# Patient Record
Sex: Female | Born: 1945
Health system: Southern US, Community
[De-identification: ages and names within clinical notes are randomized; demographics above are authoritative.]

## PROBLEM LIST (undated history)

## (undated) DIAGNOSIS — E785 Hyperlipidemia, unspecified: Secondary | ICD-10-CM

## (undated) DIAGNOSIS — H269 Unspecified cataract: Secondary | ICD-10-CM

## (undated) DIAGNOSIS — K5790 Diverticulosis of intestine, part unspecified, without perforation or abscess without bleeding: Secondary | ICD-10-CM

## (undated) DIAGNOSIS — C4492 Squamous cell carcinoma of skin, unspecified: Secondary | ICD-10-CM

## (undated) DIAGNOSIS — L989 Disorder of the skin and subcutaneous tissue, unspecified: Secondary | ICD-10-CM

## (undated) DIAGNOSIS — I6529 Occlusion and stenosis of unspecified carotid artery: Secondary | ICD-10-CM

## (undated) DIAGNOSIS — S6290XA Unspecified fracture of unspecified wrist and hand, initial encounter for closed fracture: Secondary | ICD-10-CM

## (undated) DIAGNOSIS — M8580 Other specified disorders of bone density and structure, unspecified site: Secondary | ICD-10-CM

## (undated) HISTORY — DX: Squamous cell carcinoma of skin, unspecified: C44.92

## (undated) HISTORY — DX: Diverticulosis of intestine, part unspecified, without perforation or abscess without bleeding: K57.90

## (undated) HISTORY — DX: Unspecified fracture of unspecified hand, initial encounter for closed fracture: S62.90XA

## (undated) HISTORY — DX: Disorder of the skin and subcutaneous tissue, unspecified: L98.9

## (undated) HISTORY — DX: Unspecified cataract: H26.9

## (undated) HISTORY — DX: Hyperlipidemia, unspecified: E78.5

## (undated) HISTORY — PX: OTHER SURGICAL HISTORY: SHX169

## (undated) HISTORY — PX: CATARACT EXTRACTION W/ INTRAOCULAR LENS IMPLANT: SHX1309

## (undated) HISTORY — PX: EYE SURGERY: SHX253

## (undated) HISTORY — DX: Other specified disorders of bone density and structure, unspecified site: M85.80

## (undated) HISTORY — DX: Occlusion and stenosis of unspecified carotid artery: I65.29

---

## 2000-10-08 ENCOUNTER — Ambulatory Visit (HOSPITAL_COMMUNITY): Admission: RE | Admit: 2000-10-08 | Discharge: 2000-10-08 | Payer: Self-pay | Admitting: Obstetrics and Gynecology

## 2000-10-08 ENCOUNTER — Encounter: Payer: Self-pay | Admitting: Obstetrics and Gynecology

## 2000-10-12 ENCOUNTER — Ambulatory Visit (HOSPITAL_COMMUNITY): Admission: RE | Admit: 2000-10-12 | Discharge: 2000-10-12 | Payer: Self-pay | Admitting: Obstetrics and Gynecology

## 2000-10-12 ENCOUNTER — Encounter: Payer: Self-pay | Admitting: Obstetrics and Gynecology

## 2000-10-15 ENCOUNTER — Encounter: Payer: Self-pay | Admitting: Obstetrics and Gynecology

## 2000-10-15 ENCOUNTER — Ambulatory Visit (HOSPITAL_COMMUNITY): Admission: RE | Admit: 2000-10-15 | Discharge: 2000-10-15 | Payer: Self-pay | Admitting: Obstetrics and Gynecology

## 2000-11-09 ENCOUNTER — Ambulatory Visit (HOSPITAL_COMMUNITY): Admission: RE | Admit: 2000-11-09 | Discharge: 2000-11-09 | Payer: Self-pay | Admitting: Gastroenterology

## 2001-10-14 ENCOUNTER — Ambulatory Visit (HOSPITAL_COMMUNITY): Admission: RE | Admit: 2001-10-14 | Discharge: 2001-10-14 | Payer: Self-pay | Admitting: Obstetrics and Gynecology

## 2001-10-14 ENCOUNTER — Encounter: Payer: Self-pay | Admitting: Obstetrics and Gynecology

## 2002-10-21 ENCOUNTER — Ambulatory Visit (HOSPITAL_COMMUNITY): Admission: RE | Admit: 2002-10-21 | Discharge: 2002-10-21 | Payer: Self-pay | Admitting: Obstetrics and Gynecology

## 2002-10-21 ENCOUNTER — Encounter: Payer: Self-pay | Admitting: Obstetrics and Gynecology

## 2003-10-28 ENCOUNTER — Ambulatory Visit (HOSPITAL_COMMUNITY): Admission: RE | Admit: 2003-10-28 | Discharge: 2003-10-28 | Payer: Self-pay | Admitting: Obstetrics and Gynecology

## 2004-11-02 ENCOUNTER — Ambulatory Visit (HOSPITAL_COMMUNITY): Admission: RE | Admit: 2004-11-02 | Discharge: 2004-11-02 | Payer: Self-pay | Admitting: Obstetrics and Gynecology

## 2004-12-29 ENCOUNTER — Ambulatory Visit: Payer: Self-pay | Admitting: Internal Medicine

## 2005-11-13 ENCOUNTER — Ambulatory Visit (HOSPITAL_COMMUNITY): Admission: RE | Admit: 2005-11-13 | Discharge: 2005-11-13 | Payer: Self-pay | Admitting: Obstetrics and Gynecology

## 2006-11-16 ENCOUNTER — Ambulatory Visit (HOSPITAL_COMMUNITY): Admission: RE | Admit: 2006-11-16 | Discharge: 2006-11-16 | Payer: Self-pay | Admitting: Obstetrics and Gynecology

## 2007-02-28 HISTORY — PX: CHOLECYSTECTOMY: SHX55

## 2007-10-23 ENCOUNTER — Ambulatory Visit (HOSPITAL_COMMUNITY): Admission: RE | Admit: 2007-10-23 | Discharge: 2007-10-23 | Payer: Self-pay | Admitting: Orthopedic Surgery

## 2007-10-25 ENCOUNTER — Inpatient Hospital Stay (HOSPITAL_COMMUNITY): Admission: EM | Admit: 2007-10-25 | Discharge: 2007-10-26 | Payer: Self-pay | Admitting: Emergency Medicine

## 2007-10-25 ENCOUNTER — Encounter (INDEPENDENT_AMBULATORY_CARE_PROVIDER_SITE_OTHER): Payer: Self-pay | Admitting: Surgery

## 2007-11-26 ENCOUNTER — Ambulatory Visit (HOSPITAL_COMMUNITY): Admission: RE | Admit: 2007-11-26 | Discharge: 2007-11-26 | Payer: Self-pay | Admitting: Obstetrics and Gynecology

## 2008-11-27 ENCOUNTER — Ambulatory Visit (HOSPITAL_COMMUNITY): Admission: RE | Admit: 2008-11-27 | Discharge: 2008-11-27 | Payer: Self-pay | Admitting: Obstetrics and Gynecology

## 2009-09-19 ENCOUNTER — Emergency Department (HOSPITAL_COMMUNITY): Admission: EM | Admit: 2009-09-19 | Discharge: 2009-09-19 | Payer: Self-pay | Admitting: Emergency Medicine

## 2009-11-29 ENCOUNTER — Ambulatory Visit (HOSPITAL_COMMUNITY): Admission: RE | Admit: 2009-11-29 | Discharge: 2009-11-29 | Payer: Self-pay | Admitting: Obstetrics and Gynecology

## 2010-03-21 ENCOUNTER — Encounter: Payer: Self-pay | Admitting: Orthopedic Surgery

## 2010-06-07 ENCOUNTER — Other Ambulatory Visit: Payer: Self-pay | Admitting: Family Medicine

## 2010-06-07 DIAGNOSIS — S199XXA Unspecified injury of neck, initial encounter: Secondary | ICD-10-CM

## 2010-06-07 DIAGNOSIS — S0990XA Unspecified injury of head, initial encounter: Secondary | ICD-10-CM

## 2010-06-10 ENCOUNTER — Ambulatory Visit
Admission: RE | Admit: 2010-06-10 | Discharge: 2010-06-10 | Disposition: A | Discharge status: Home / Self Care | Payer: Medicare Other | Source: Ambulatory Visit | Attending: Family Medicine | Admitting: Family Medicine

## 2010-06-10 DIAGNOSIS — S199XXA Unspecified injury of neck, initial encounter: Secondary | ICD-10-CM

## 2010-06-10 DIAGNOSIS — S0990XA Unspecified injury of head, initial encounter: Secondary | ICD-10-CM

## 2010-07-12 NOTE — H&P (Signed)
NAMETYJAH, HAI NO.:  [id number]   MEDICAL RECORD NO.:  [id number]          PATIENT TYPE:  INP   LOCATION:  0098                         FACILITY:  [facility]   PHYSICIAN:  Sandria Bales. Ezzard Standing, M.D.  DATE OF BIRTH:  1946-02-04   DATE OF ADMISSION:  10/25/2007  DATE OF DISCHARGE:                              HISTORY & PHYSICAL   Admission H&P  Date of admission ??   REFERRING PHYSICIAN:  Ernestina Penna, M.D.   REASON FOR REFERRAL:  Gallbladder disease.   HISTORY OF ILLNESS:  Ms. Mee is a 65 year old white female a patient  of Dr. Vernon Prey, who has had a vague, right upper quadrant pain for  about 10 days.  This pain has gotten steadily worse.  She has [place]  food one night and had severe nausea and vomiting associated with it.  She went by an urgent care on [address] about 5-6 days ago.  They  thought she had a pulled muscle.  She then was seen and had an  ultrasound ordered on Wednesday, October 23, 2007.  This shows a  gallstone in the gallbladder, but no thickening of the gallbladder wall.  Her symptoms have persisted.  She saw Dr. Vernon Prey today.  Because of  persistent symptoms, he called me.   ALLERGIES:  1. PENICILLIN which makes her itch, but this was over 20 years ago.  2. SULFA which gave some whelps, this was about 10 years ago.   CURRENT MEDICATIONS:  1. Tricor 145 mg daily.  2. Vytorin 10/20 145 daily.  3. Aspirin 81 mg daily.  4. Calcium.   REVIEW OF SYSTEMS:  NEUROLOGIC:  No seizure or loss of consciousness.  PULMONARY:  She does not smoke cigarettes.  No history of pneumonia or  tuberculosis.  CARDIAC:  She has been told she has a heart murmur.  She has had no  cardiac evaluation, cardiac catheterization, has not seen a  cardiologist.  GASTROINTESTINAL:  She has had a history of diverticular disease.  Had a  colonoscopy by Dr. Bernette Redbird in 2008 which was negative.  She has  had no history of peptic ulcer disease.  No liver  disease, pancreatic  disease, colon disease.  Only prior abdominal surgery was a tubal  ligation in the past.  GYNECOLOGIC:  She has no vaginal discharge.  She sees Dr. Kyra Manges  from a GYN standpoint.  UROLOGIC:  She has no kidney stones or kidney infections.   PERSONAL HISTORY:  Her husband died earlier this year apparently of a  massive heart attack.  She has 2 children, a son and a daughter who both  live up in [place].  Today  is her granddaughter's birthday today.  She is retired from [employer] in [place].   PHYSICAL EXAMINATION:  VITAL SIGNS:  Temperature 98.2, blood pressure  172/80, pulse 62, respirations 18.  GENERAL:  She is a well-nourished, pleasant, tan white female alert and  cooperative on physical examination.  HEENT:  Unremarkable.  NECK:  Supple.  I found no mass, no thyromegaly.  LUNGS:  Clear to auscultation with symmetric breath sounds.  HEART:  Regular rate and rhythm.  She does have what feels like maybe a  low 2/6 systolic murmur heard best at the left sternal border.  BREASTS:  I did not do a breast exam on her.  She is up for mammogram in  September.  ABDOMEN:  She has some vague right upper quadrant pain, but without any  guarding or rebound.  She has some present bowel sounds.  Again, she is  tan.  Her only scars are the umbilicus.  EXTREMITIES:  She has good strength in all 4 extremities.  NEUROLOGIC:  Grossly intact.   LABORATORY DATA:  Labs that I have--the first are dated September 15, 2007  which shows normal liver functions at that time.  Then more recently,  she had a CBC today at [facility] that shows a white  blood count of 9,600, hemoglobin 13.2 with a normal differential.   DIAGNOSTICS:  Again, I rereviewed her ultrasound with Tor Netters.  The  [place] was done on October 23, 2007 that showed a tiny 2 mm gallstone and  otherwise unremarkable gallbladder.   IMPRESSION:  1. Probable gallbladder disease.  I  discussed with her at length the options for treatment.  I told she  did not have classic findings of acute cholecystitis by ultrasound, but  she does have a gallstone.  She does have symptoms which suggest  gallbladder disease.  She has symptoms which are related to eating.  All  of which I think lean toward biliary disease as  she has no other  underlying GI problems that we are aware of.  I offered the option of  going ahead with gallbladder surgery versus waiting.  I also told that  she knows she is on aspirin, there is increased risk of bleeding, but  she was anxious to go ahead and have her surgery done at this time.  I told her if the surgery did not take care of her symptoms, she may  need further evaluation by Dr. Matthias Hughs with something like an endoscopy  or other tests.  The risks of gallbladder surgery I told her with doing  the laparoscopic, the risk of bleeding, infection, nerve injury, common  bile duct injury and the possibility of open surgery.  I think she  understands this all very well.  1. Hypercholesterolemia.  2. Minimal heart murmur.  3. History of diverticular disease, followed by Dr. Matthias Hughs.  4. On aspirin.  (Patient knows this is a risk factor for increased      bleeding.)      Sandria Bales. Ezzard Standing, M.D.  Electronically Signed     DHN/MEDQ  D:  10/25/2007  T:  10/25/2007  Job:  [id number]   cc:   Ernestina Penna, M.D.  Fax: [phone]   Bernette Redbird, M.D.  Fax: [phone]   S. Kyra Manges, M.D.  Fax: [phone]

## 2010-07-12 NOTE — Op Note (Signed)
Carol [name], EDMONDS NO.:  [id number]   MEDICAL RECORD NO.:  [id number]          PATIENT TYPE:  INP   LOCATION:  1519                         FACILITY:  [facility]   PHYSICIAN:  Sandria Bales. Ezzard Standing, M.D.  DATE OF BIRTH:  Nov 10, 1945   DATE OF PROCEDURE:  10/25/2007  DATE OF DISCHARGE:  10/26/2007                               OPERATIVE REPORT   Date of surgery ??   PREOPERATIVE DIAGNOSES:  1. Cholecystitis.  2. Cholelithiasis.   POSTOPERATIVE DIAGNOSES:  1. Chronic cholecystitis.  2. Cholelithiasis.   PROCEDURE:  Laparoscopic cholecystectomy with intraoperative  cholangiogram.   SURGEON:  Dr. Ezzard Standing.   FIRST ASSISTANT:  None.   ANESTHESIA:  General endotracheal.   ESTIMATED BLOOD LOSS:  Minimal.   INDICATION OF PROCEDURE:  Ms. Lacap is a 65 year old white female who  is a patient of Dr. Vernon Prey who has had about 10-day history of  epigastric abdominal pain, which is located to the right upper quadrant.  She had an ultrasound obtained, which showed a single gallstone and she  has had persistent symptoms.  She presented to the [facility] Emergency  Room after being referred by Dr. Vernon Prey.  Discussion with her about  proceeding with gallbladder surgery.   The indications and potential complications of the gallbladder surgery  explained to the patient.  Potential complications include, but are not  limited to, bleeding, infection, bile duct, and possible of open  surgery.   Also there is a chance that her symptoms may not be relieved by the  gallbladder surgery.   OPERATIVE NOTE:  The patient placed in a supine position, given a  general endotracheal anesthetic.  Her abdomen was prepped with [facility]-  Care and then sterilely draped.  A time-out was held to identifying the  patient and procedure.   I accessed the abdominal cavity through infraumbilical incision with  sharp dissection carried down to abdominal cavity.  A 10-mm laparoscope  was inserted  through a 12-mm Hasson trocar.  The Hasson trocar was  secured with the 0 Vicryl suture.  Three additional trocars were placed,  a 10-mm subxiphoid, 5 mm right mid subcostal, and 5 mm lateral subcostal  trocar.   I did an exploration of right and left lower lobes of liver was  unremarkable.  The stomach and duodenum were unremarkable.  The bile  duct, I could see was unremarkable.  There is no other mass, lesions, or  anything else suspicious in her abdomen.   I then focused on the gallbladder, I grasped the gallbladder rostrally  and cephalad.  She did have some fatty adhesions along the wall of the  gallbladder consistent with chronic cholecystitis.  I then took down to  the gallbladder to the cystic duct junction and identified the cystic  artery, which I triply endoclipped and divided.  I then placed a clip on  the gallbladder at the side of cystic duct.  I shot an intraoperative  cholangiogram.   The intraoperative cholangiogram showed free flow of contrast down the  cystic duct and the common bile duct into the duodenum  up to the hepatic  radicals.  There was no filling defect and this was thought to be a  normal intraoperative cholangiogram.   I then removed the Taut catheter.  I triply endoclipped the cystic duct  and divided it.  I then visualized the triangle of Calot.  I visualized  the gallbladder bed and then used the primary hook, Bovie coagulation to  dissect the gallbladder from the gallbladder bed.   Prior to complete division of the gallbladder from the gallbladder bed,  I revisualized the triangle of Calot, I revisualized the gallbladder  bed.  There was no bleeding or bile leak.  I then divided the  gallbladder, placed in EndoCatch bag, and delivered through the  umbilicus.   I then irrigated the abdomen about a liter of saline.  I saw no other  problems or injuries.  Each trocar was then removed in turn.  The  umbilical port was closed with two 0 Vicryl  sutures.  The skin at each  site was closed with a 5-0 Vicryl suture.  Painted with tincture of  Benzoin and Steri-stripped and sterilely dressed.  All sponge and needle  counts were correct at the end of the case.   The patient was sent to the recovery room in good condition.        Sandria Bales. Ezzard Standing, M.D.  Electronically Signed     DHN/MEDQ  D:  10/25/2007  T:  10/26/2007  Job:  [id number]   cc:   Ernestina Penna, M.D.  Bernette Redbird, M.D.

## 2010-07-15 NOTE — Procedures (Signed)
[place]  Patient:    Carol [name], Carol [name] Visit Number: [id number] MRN: [id number]          Service Type: Attending:  Florencia Reasons, M.D. Dictated by:   Florencia Reasons, M.D. Proc. Date: 11/09/00   CC:         Katherine Roan, M.D.  Monica Becton, M.D.   Procedure Report  PROCEDURE PERFORMED:  Colonoscopy.  INDICATION:  Transiently hemoccult positive stool and abnormal appearing sigmoid region on the barium enema.  Rule out colorectal neoplasia.  FINDINGS:  Significant sigmoid diverticular disease with associated muscular thickening, but no evidence of intraluminal neoplasia.  DESCRIPTION OF PROCEDURE:  The nature, purpose and risks of the procedure had been reviewed with the patient who provided written consent.  Sedation was Fentanyl 75 mcg and Versed 7.5 mg IV without arrhythmias or desaturation.  The Olympus pediatric standard colonoscope was advanced without much difficulty around the colon and into a normal appearing terminal ileum for a short distance, after which pull back was initiated.  The quality of the prep was excellent and it is felt that all areas were well seen.  The only abnormality on this exam was moderate sigmoid diverticular disease with associated muscular thickening in that region, but no evidence of mucosal abnormalities.  Specifically, there was no evidence of polyps, cancer, colitis or vascular malformation.  Retroflexion in the rectum was normal.  No biopsies were obtained.  The patient tolerated procedure well.  There were no apparent complications.  IMPRESSION:  Sigmoid diverticulosis with muscular thickening presumably accounting for the mild narrowing on the barium enema.  I reviewed the barium enema films and find them to be compatible with the observed colonoscopic findings.  Specifically, the barium enema did not show any high grade stricture or "apple cord" lesion, nor any other particularly  worrisome findings.  Note, that the sigmoid region was examined twice during this exam, that is I went back up and readvanced the scope through the sigmoid region to the descending colon area to recheck the area and again uncovered no significant abnormalities.  RECOMMENDATIONS:  The patient is currently asymptomatic.  Her abdominal pain and transient diarrhea have resolved and the hemoccult stool which was detected by Dr. Elana Alm had become hemoccult negative by the time I checked the patient.  This was probably a transient case of diverticulitis and I do not feel that any further evaluation is needed.  Follow up screening examination with sigmoidoscopy in five years would be reasonable and I will put the patient on our follow up list for that purpose. Dictated by:   Florencia Reasons, M.D. Attending:  Florencia Reasons, M.D. DD:  11/09/00 TD:  11/09/00 Job: [id number]

## 2010-09-12 ENCOUNTER — Emergency Department (HOSPITAL_COMMUNITY)
Admission: EM | Admit: 2010-09-12 | Discharge: 2010-09-13 | Disposition: A | Discharge status: Home / Self Care | Payer: Medicare Other | Attending: Emergency Medicine | Admitting: Emergency Medicine

## 2010-09-12 DIAGNOSIS — Z7982 Long term (current) use of aspirin: Secondary | ICD-10-CM | POA: Insufficient documentation

## 2010-09-12 DIAGNOSIS — Z79899 Other long term (current) drug therapy: Secondary | ICD-10-CM | POA: Insufficient documentation

## 2010-09-12 DIAGNOSIS — E7800 Pure hypercholesterolemia, unspecified: Secondary | ICD-10-CM | POA: Insufficient documentation

## 2010-09-12 DIAGNOSIS — R109 Unspecified abdominal pain: Secondary | ICD-10-CM | POA: Insufficient documentation

## 2010-09-12 DIAGNOSIS — K5730 Diverticulosis of large intestine without perforation or abscess without bleeding: Secondary | ICD-10-CM | POA: Insufficient documentation

## 2010-09-13 LAB — CBC
Hemoglobin: 12.9 g/dL (ref 12.0–15.0)
MCH: 30.1 pg (ref 26.0–34.0)
WBC: 13.7 10*3/uL — ABNORMAL HIGH (ref 4.0–10.5)

## 2010-09-13 LAB — DIFFERENTIAL
Basophils Absolute: 0 10*3/uL (ref 0.0–0.1)
Basophils Relative: 0 % (ref 0–1)
Eosinophils Relative: 1 % (ref 0–5)
Lymphocytes Relative: 16 % (ref 12–46)
Monocytes Relative: 7 % (ref 3–12)
Neutro Abs: 10.5 10*3/uL — ABNORMAL HIGH (ref 1.7–7.7)
Neutrophils Relative %: 77 % (ref 43–77)

## 2010-09-13 LAB — URINALYSIS, ROUTINE W REFLEX MICROSCOPIC
Bilirubin Urine: NEGATIVE
Ketones, ur: NEGATIVE mg/dL
Protein, ur: NEGATIVE mg/dL

## 2010-09-13 LAB — BASIC METABOLIC PANEL
BUN: 13 mg/dL (ref 6–23)
CO2: 29 mEq/L (ref 19–32)
Chloride: 106 mEq/L (ref 96–112)

## 2010-09-13 LAB — URINE MICROSCOPIC-ADD ON

## 2010-11-23 ENCOUNTER — Other Ambulatory Visit: Payer: Self-pay | Admitting: Family Medicine

## 2010-11-23 DIAGNOSIS — Z139 Encounter for screening, unspecified: Secondary | ICD-10-CM

## 2010-12-05 ENCOUNTER — Ambulatory Visit (HOSPITAL_COMMUNITY)
Admission: RE | Admit: 2010-12-05 | Discharge: 2010-12-05 | Disposition: A | Discharge status: Home / Self Care | Payer: Medicare Other | Source: Ambulatory Visit | Attending: Family Medicine | Admitting: Family Medicine

## 2010-12-05 DIAGNOSIS — Z1231 Encounter for screening mammogram for malignant neoplasm of breast: Secondary | ICD-10-CM | POA: Insufficient documentation

## 2010-12-05 DIAGNOSIS — Z139 Encounter for screening, unspecified: Secondary | ICD-10-CM

## 2011-03-02 DIAGNOSIS — H52229 Regular astigmatism, unspecified eye: Secondary | ICD-10-CM | POA: Diagnosis not present

## 2011-03-02 DIAGNOSIS — H524 Presbyopia: Secondary | ICD-10-CM | POA: Diagnosis not present

## 2011-03-02 DIAGNOSIS — H40019 Open angle with borderline findings, low risk, unspecified eye: Secondary | ICD-10-CM | POA: Diagnosis not present

## 2011-03-02 DIAGNOSIS — H5200 Hypermetropia, unspecified eye: Secondary | ICD-10-CM | POA: Diagnosis not present

## 2011-03-14 DIAGNOSIS — J019 Acute sinusitis, unspecified: Secondary | ICD-10-CM | POA: Diagnosis not present

## 2011-03-15 DIAGNOSIS — H40019 Open angle with borderline findings, low risk, unspecified eye: Secondary | ICD-10-CM | POA: Diagnosis not present

## 2011-04-28 DIAGNOSIS — R195 Other fecal abnormalities: Secondary | ICD-10-CM | POA: Diagnosis not present

## 2011-04-28 DIAGNOSIS — K625 Hemorrhage of anus and rectum: Secondary | ICD-10-CM | POA: Diagnosis not present

## 2011-05-23 DIAGNOSIS — K5730 Diverticulosis of large intestine without perforation or abscess without bleeding: Secondary | ICD-10-CM | POA: Diagnosis not present

## 2011-05-23 DIAGNOSIS — R195 Other fecal abnormalities: Secondary | ICD-10-CM | POA: Diagnosis not present

## 2011-06-06 DIAGNOSIS — E785 Hyperlipidemia, unspecified: Secondary | ICD-10-CM | POA: Diagnosis not present

## 2011-07-26 DIAGNOSIS — L0291 Cutaneous abscess, unspecified: Secondary | ICD-10-CM | POA: Diagnosis not present

## 2011-07-26 DIAGNOSIS — L0390 Cellulitis, unspecified: Secondary | ICD-10-CM | POA: Diagnosis not present

## 2011-08-04 DIAGNOSIS — L0390 Cellulitis, unspecified: Secondary | ICD-10-CM | POA: Diagnosis not present

## 2011-08-04 DIAGNOSIS — L0291 Cutaneous abscess, unspecified: Secondary | ICD-10-CM | POA: Diagnosis not present

## 2011-08-05 DIAGNOSIS — L509 Urticaria, unspecified: Secondary | ICD-10-CM | POA: Diagnosis not present

## 2011-08-21 DIAGNOSIS — L0390 Cellulitis, unspecified: Secondary | ICD-10-CM | POA: Diagnosis not present

## 2011-08-21 DIAGNOSIS — L0291 Cutaneous abscess, unspecified: Secondary | ICD-10-CM | POA: Diagnosis not present

## 2011-08-28 DIAGNOSIS — L0291 Cutaneous abscess, unspecified: Secondary | ICD-10-CM | POA: Diagnosis not present

## 2011-08-28 DIAGNOSIS — L0390 Cellulitis, unspecified: Secondary | ICD-10-CM | POA: Diagnosis not present

## 2011-09-07 DIAGNOSIS — H40059 Ocular hypertension, unspecified eye: Secondary | ICD-10-CM | POA: Diagnosis not present

## 2011-11-07 DIAGNOSIS — H02409 Unspecified ptosis of unspecified eyelid: Secondary | ICD-10-CM | POA: Diagnosis not present

## 2011-11-07 DIAGNOSIS — H0230 Blepharochalasis unspecified eye, unspecified eyelid: Secondary | ICD-10-CM | POA: Diagnosis not present

## 2011-11-27 ENCOUNTER — Other Ambulatory Visit: Payer: Self-pay | Admitting: Family Medicine

## 2011-11-27 DIAGNOSIS — Z139 Encounter for screening, unspecified: Secondary | ICD-10-CM

## 2011-11-28 DIAGNOSIS — E559 Vitamin D deficiency, unspecified: Secondary | ICD-10-CM | POA: Diagnosis not present

## 2011-11-28 DIAGNOSIS — I10 Essential (primary) hypertension: Secondary | ICD-10-CM | POA: Diagnosis not present

## 2011-11-28 DIAGNOSIS — E785 Hyperlipidemia, unspecified: Secondary | ICD-10-CM | POA: Diagnosis not present

## 2011-12-04 DIAGNOSIS — E785 Hyperlipidemia, unspecified: Secondary | ICD-10-CM | POA: Diagnosis not present

## 2011-12-04 DIAGNOSIS — Z23 Encounter for immunization: Secondary | ICD-10-CM | POA: Diagnosis not present

## 2011-12-07 ENCOUNTER — Ambulatory Visit (HOSPITAL_COMMUNITY)
Admission: RE | Admit: 2011-12-07 | Discharge: 2011-12-07 | Disposition: A | Discharge status: Home / Self Care | Payer: Medicare Other | Source: Ambulatory Visit | Attending: Family Medicine | Admitting: Family Medicine

## 2011-12-07 DIAGNOSIS — Z1231 Encounter for screening mammogram for malignant neoplasm of breast: Secondary | ICD-10-CM | POA: Insufficient documentation

## 2011-12-07 DIAGNOSIS — Z139 Encounter for screening, unspecified: Secondary | ICD-10-CM

## 2012-01-02 DIAGNOSIS — Z8619 Personal history of other infectious and parasitic diseases: Secondary | ICD-10-CM | POA: Insufficient documentation

## 2012-01-02 DIAGNOSIS — K5730 Diverticulosis of large intestine without perforation or abscess without bleeding: Secondary | ICD-10-CM | POA: Insufficient documentation

## 2012-01-08 DIAGNOSIS — H02409 Unspecified ptosis of unspecified eyelid: Secondary | ICD-10-CM | POA: Diagnosis not present

## 2012-01-15 DIAGNOSIS — Z4889 Encounter for other specified surgical aftercare: Secondary | ICD-10-CM | POA: Diagnosis not present

## 2012-01-15 DIAGNOSIS — Z9889 Other specified postprocedural states: Secondary | ICD-10-CM | POA: Diagnosis not present

## 2012-02-05 DIAGNOSIS — H1030 Unspecified acute conjunctivitis, unspecified eye: Secondary | ICD-10-CM | POA: Diagnosis not present

## 2012-02-05 DIAGNOSIS — H18239 Secondary corneal edema, unspecified eye: Secondary | ICD-10-CM | POA: Diagnosis not present

## 2012-02-14 DIAGNOSIS — H18239 Secondary corneal edema, unspecified eye: Secondary | ICD-10-CM | POA: Diagnosis not present

## 2012-03-01 DIAGNOSIS — H40009 Preglaucoma, unspecified, unspecified eye: Secondary | ICD-10-CM | POA: Diagnosis not present

## 2012-03-06 DIAGNOSIS — H40009 Preglaucoma, unspecified, unspecified eye: Secondary | ICD-10-CM | POA: Diagnosis not present

## 2012-04-02 DIAGNOSIS — E559 Vitamin D deficiency, unspecified: Secondary | ICD-10-CM | POA: Diagnosis not present

## 2012-04-02 DIAGNOSIS — E785 Hyperlipidemia, unspecified: Secondary | ICD-10-CM | POA: Diagnosis not present

## 2012-04-02 DIAGNOSIS — I10 Essential (primary) hypertension: Secondary | ICD-10-CM | POA: Diagnosis not present

## 2012-04-12 DIAGNOSIS — H2510 Age-related nuclear cataract, unspecified eye: Secondary | ICD-10-CM | POA: Diagnosis not present

## 2012-04-12 DIAGNOSIS — H40019 Open angle with borderline findings, low risk, unspecified eye: Secondary | ICD-10-CM | POA: Diagnosis not present

## 2012-04-25 DIAGNOSIS — H2589 Other age-related cataract: Secondary | ICD-10-CM | POA: Diagnosis not present

## 2012-04-25 DIAGNOSIS — H2510 Age-related nuclear cataract, unspecified eye: Secondary | ICD-10-CM | POA: Diagnosis not present

## 2012-04-30 DIAGNOSIS — L723 Sebaceous cyst: Secondary | ICD-10-CM | POA: Diagnosis not present

## 2012-04-30 DIAGNOSIS — L570 Actinic keratosis: Secondary | ICD-10-CM | POA: Diagnosis not present

## 2012-05-13 ENCOUNTER — Other Ambulatory Visit: Payer: Self-pay | Admitting: *Deleted

## 2012-05-13 DIAGNOSIS — Z780 Asymptomatic menopausal state: Secondary | ICD-10-CM

## 2012-06-13 ENCOUNTER — Other Ambulatory Visit: Payer: Self-pay | Admitting: Physician Assistant

## 2012-06-13 DIAGNOSIS — L723 Sebaceous cyst: Secondary | ICD-10-CM | POA: Diagnosis not present

## 2012-06-13 DIAGNOSIS — L570 Actinic keratosis: Secondary | ICD-10-CM | POA: Diagnosis not present

## 2012-06-24 DIAGNOSIS — L089 Local infection of the skin and subcutaneous tissue, unspecified: Secondary | ICD-10-CM | POA: Diagnosis not present

## 2012-07-04 DIAGNOSIS — H2589 Other age-related cataract: Secondary | ICD-10-CM | POA: Diagnosis not present

## 2012-07-04 DIAGNOSIS — H2510 Age-related nuclear cataract, unspecified eye: Secondary | ICD-10-CM | POA: Diagnosis not present

## 2012-07-17 ENCOUNTER — Other Ambulatory Visit: Payer: Self-pay

## 2012-07-17 ENCOUNTER — Ambulatory Visit: Payer: Self-pay

## 2012-07-19 ENCOUNTER — Other Ambulatory Visit (INDEPENDENT_AMBULATORY_CARE_PROVIDER_SITE_OTHER): Payer: Medicare Other

## 2012-07-19 DIAGNOSIS — E785 Hyperlipidemia, unspecified: Secondary | ICD-10-CM

## 2012-07-19 LAB — COMPREHENSIVE METABOLIC PANEL
ALT: 15 U/L (ref 0–35)
AST: 20 U/L (ref 0–37)
Albumin: 4.3 g/dL (ref 3.5–5.2)
Alkaline Phosphatase: 39 U/L (ref 39–117)
BUN: 16 mg/dL (ref 6–23)
CO2: 29 mEq/L (ref 19–32)
Calcium: 9.5 mg/dL (ref 8.4–10.5)
Chloride: 106 mEq/L (ref 96–112)
Creat: 1.02 mg/dL (ref 0.50–1.10)
Glucose, Bld: 92 mg/dL (ref 70–99)
Potassium: 4.6 mEq/L (ref 3.5–5.3)
Sodium: 142 mEq/L (ref 135–145)
Total Bilirubin: 0.7 mg/dL (ref 0.3–1.2)
Total Protein: 7 g/dL (ref 6.0–8.3)

## 2012-07-19 NOTE — Progress Notes (Signed)
Patient in today for labs only. °

## 2012-07-23 ENCOUNTER — Ambulatory Visit (INDEPENDENT_AMBULATORY_CARE_PROVIDER_SITE_OTHER): Payer: Medicare Other

## 2012-07-23 ENCOUNTER — Ambulatory Visit (INDEPENDENT_AMBULATORY_CARE_PROVIDER_SITE_OTHER): Payer: Medicare Other | Admitting: Family Medicine

## 2012-07-23 ENCOUNTER — Encounter: Payer: Self-pay | Admitting: Family Medicine

## 2012-07-23 VITALS — BP 143/63 | HR 88 | Temp 97.4°F | Wt 175.2 lb

## 2012-07-23 DIAGNOSIS — E785 Hyperlipidemia, unspecified: Secondary | ICD-10-CM

## 2012-07-23 DIAGNOSIS — M779 Enthesopathy, unspecified: Secondary | ICD-10-CM | POA: Diagnosis not present

## 2012-07-23 DIAGNOSIS — S9002XA Contusion of left ankle, initial encounter: Secondary | ICD-10-CM

## 2012-07-23 DIAGNOSIS — S9000XA Contusion of unspecified ankle, initial encounter: Secondary | ICD-10-CM

## 2012-07-23 NOTE — Progress Notes (Signed)
Patient ID: Carol [name], female   DOB: 27-Mar-1945, 67 y.o.   MRN: [id number] SUBJECTIVE: HPI: Patient is here for follow up of hyperlipidemia:  denies Headache;denies Chest Pain;denies weakness;denies Shortness of Breath and orthopnea;denies Visual changes;denies palpitations;denies cough;denies pedal edema;denies symptoms of TIA or stroke;deniesClaudication symptoms. admits to Compliance with medications; denies Problems with medications.  BP at home is normal 121/63 even at surgery.    PMH/PSH: reviewed/updated in Epic  SH/FH: reviewed/updated in Epic  Allergies: reviewed/updated in Epic  Medications: reviewed/updated in Epic  Immunizations: reviewed/updated in Epic  ROS: As above in the HPI. All other systems are stable or negative.  OBJECTIVE: APPEARANCE:  Patient in no acute distress.The patient appeared well nourished and normally developed. Acyanotic. Waist: VITAL SIGNS:BP 143/63  Pulse 88  Temp(Src) 97.4 F (36.3 C) (Oral)  Wt 175 lb 3.2 oz (79.47 kg) BP 130/80  SKIN: warm and  Dry without overt rashes, tattoos and scars  HEAD and Neck: without JVD, Head and scalp: normal Eyes:No scleral icterus. Fundi normal, eye movements normal. Ears: Auricle normal, canal normal, Tympanic membranes normal, insufflation normal. Nose: normal Throat: normal Neck & thyroid: normal  CHEST & LUNGS: Chest wall: normal Lungs: Clear  CVS: Reveals the PMI to be normally located. Regular rhythm, First and Second Heart sounds are normal,  absence of murmurs, rubs or gallops. Peripheral vasculature: Radial pulses: normal Dorsal pedis pulses: normal Posterior pulses: normal  ABDOMEN:  Appearance: normal Benign,, no organomegaly, no masses, no Abdominal Aortic enlargement. No Guarding , no rebound. No Bruits. Bowel sounds: normal  RECTAL: N/A GU: N/A  EXTREMETIES: nonedematous. Both Femoral and Pedal pulses are normal. Tender lateral  malleolus.  MUSCULOSKELETAL:  Spine: normal Joints:left ankle point tenderness at the lateral malleolus  NEUROLOGIC: oriented to time,place and person; nonfocal. Strength is normal Sensory is normal Reflexes are normal Cranial Nerves are normal.  ASSESSMENT: HLD (hyperlipidemia)  Tendonitis  Contusion of left ankle, initial encounter - Plan: DG Ankle Complete Left    PLAN: otc right thumb splint. For up to 2 weeks. Ice and ben gays. Stop WII games with Thumbs.  Ice to left ankle and  Elevate. If no Fracture then keep active.  Orders Placed This Encounter  Procedures  . DG Ankle Complete Left    Standing Status: Future     Number of Occurrences: 1     Standing Expiration Date: 09/22/2013    Order Specific Question:  Reason for Exam (SYMPTOM  OR DIAGNOSIS REQUIRED)    Answer:  contusion left ankle painfull to stand    Order Specific Question:  Preferred imaging location?    Answer:  Internal   meds reviewed with patient.   Wellness reviewed with patient , no need for Pap. Patient plans to get shots next visit.  [facility] reading (PRIMARY) by  Dr.Freedom Peddy: no fracture seen.  RTC 4 months  Await labs.  Healthy diet and exercise  Oliverio Cho P. Modesto Charon, M.D.

## 2012-07-24 LAB — NMR LIPOPROFILE WITH LIPIDS
Cholesterol, Total: 183 mg/dL (ref <200)
HDL Particle Number: 43.9 umol/L (ref >=30.5)
HDL Size: 8.6 nm — ABNORMAL LOW (ref >=9.2)
HDL-C: 52 mg/dL (ref >=40)
LDL (calc): 112 mg/dL — ABNORMAL HIGH (ref <100)
LDL Particle Number: 1415 nmol/L — ABNORMAL HIGH (ref <1000)
LDL Size: 21.4 nm (ref >20.5)
LP-IR Score: 46 — ABNORMAL HIGH (ref <=45)
Large HDL-P: 4.5 umol/L — ABNORMAL LOW (ref >=4.8)
Large VLDL-P: 1.7 nmol/L (ref <=2.7)
Small LDL Particle Number: 585 nmol/L — ABNORMAL HIGH (ref <=527)
Triglycerides: 94 mg/dL (ref <150)
VLDL Size: 41.6 nm (ref <=46.6)

## 2012-07-29 ENCOUNTER — Telehealth: Payer: Self-pay | Admitting: Family Medicine

## 2012-07-29 NOTE — Telephone Encounter (Signed)
Pt.notified

## 2012-07-31 ENCOUNTER — Other Ambulatory Visit: Payer: Self-pay | Admitting: Family Medicine

## 2012-07-31 DIAGNOSIS — E785 Hyperlipidemia, unspecified: Secondary | ICD-10-CM

## 2012-07-31 MED ORDER — EZETIMIBE 10 MG PO TABS: 10.0000 mg | ORAL_TABLET | Freq: Every day | ORAL | Status: DC

## 2012-07-31 MED ORDER — ATORVASTATIN CALCIUM 40 MG PO TABS: 40.0000 mg | ORAL_TABLET | Freq: Every day | ORAL | Status: DC

## 2012-07-31 NOTE — Progress Notes (Signed)
Rx ordered in EPIC.

## 2012-07-31 NOTE — Progress Notes (Signed)
Will need to change cholesterol meds from Vytorin to Zetia 10 mg and atorvastatin 40 mg daily.

## 2012-08-01 ENCOUNTER — Telehealth: Payer: Self-pay | Admitting: Family Medicine

## 2012-08-01 NOTE — Telephone Encounter (Signed)
Pt notified  her vytorin was stoppped and rx to [facility] for her atorvastatin and zetia.

## 2012-08-06 DIAGNOSIS — H02059 Trichiasis without entropian unspecified eye, unspecified eyelid: Secondary | ICD-10-CM | POA: Diagnosis not present

## 2012-08-29 ENCOUNTER — Other Ambulatory Visit: Payer: Self-pay | Admitting: Dermatology

## 2012-08-29 DIAGNOSIS — L723 Sebaceous cyst: Secondary | ICD-10-CM | POA: Diagnosis not present

## 2012-09-11 ENCOUNTER — Ambulatory Visit (INDEPENDENT_AMBULATORY_CARE_PROVIDER_SITE_OTHER): Payer: Medicare Other | Admitting: Pharmacist

## 2012-09-11 ENCOUNTER — Ambulatory Visit (INDEPENDENT_AMBULATORY_CARE_PROVIDER_SITE_OTHER): Payer: Medicare Other

## 2012-09-11 VITALS — Ht 65.5 in | Wt 177.0 lb

## 2012-09-11 DIAGNOSIS — M949 Disorder of cartilage, unspecified: Secondary | ICD-10-CM

## 2012-09-11 DIAGNOSIS — M899 Disorder of bone, unspecified: Secondary | ICD-10-CM

## 2012-09-11 DIAGNOSIS — E785 Hyperlipidemia, unspecified: Secondary | ICD-10-CM | POA: Diagnosis not present

## 2012-09-11 DIAGNOSIS — M8580 Other specified disorders of bone density and structure, unspecified site: Secondary | ICD-10-CM | POA: Insufficient documentation

## 2012-09-11 DIAGNOSIS — Z780 Asymptomatic menopausal state: Secondary | ICD-10-CM

## 2012-09-11 NOTE — Patient Instructions (Addendum)

## 2012-09-11 NOTE — Progress Notes (Signed)
Patient ID: Carol [name], female   DOB: 1945/11/22, 67 y.o.   MRN: [id number] [facility] Current Height: Height: 5' 5.5" (166.4 cm)      Max Lifetime Height:  5\' 6"  Current Weight: Weight: 177 lb (80.287 kg)       Ethnicity:Caucasian    HPI: 1. Osteoporosis screening / osteopenia -  pt already has a diagnosis of Osteopenia  Back Pain?  No       Kyphosis?  No Prior fracture?  Yes - right hand Med(s) for Osteoporosis/Osteopenia:  Calcium / MVI / vitamin D Med(s) previously tried for Osteoporosis/Osteopenia:  None   2.  Hyperlipidemia / hypertriglyceridemia - patient previously took fenofibrate 160mg  daily and Vytorin 10/20mg  daily. Most recent lipid panel showed elevated LDL and LDL-P so Dr. Modesto Charon changed her to atorvastatin 40mg  daily + Zetia 10mg .  Patient took atorvastatin for about 2 weeks and then began to experience myalgias.  She currently is not taking either the atorvastatin or Zetia                                                             PMH: Age at menopause:  36's Hysterectomy?  No Oophorectomy?  No HRT? No Steroid Use?  No Thyroid med?  No History of cancer?  No History of digestive disorders (ie Crohn's)?  No Current or previous eating disorders?  No Last Vitamin D Result:  36 (03/2012) Last serum creatinine  Result:  1.02 (06/2012)   FH/SH: Family history of osteoporosis?  No + family history of CAD in parent and sibling Parent with history of hip fracture?  No Family history of breast cancer?  Yes - sisters and niece Exercise?  Yes - walking twice weekly Smoking?  No Alcohol?  No    Calcium Assessment Calcium Intake  # of servings/day  Calcium mg  Milk (8 oz) 1  x  300  = 300mg   Yogurt (4 oz) 0 x  200 = 0  Cheese (1 oz) 0 x  200 = 0  Other Calcium sources   250mg   Ca supplement Calcium 600mg  daily and MVI = 1000mg    Estimated calcium intake per day 1550mg      DEXA Results Date of Test T-Score for AP Spine L1-L4 T-Score for Total Left Hip  T-Score for Total Right Hip  09/11/2012 -0.1 -0.4 -0.6  02/24/2009 -0.3 -0.3 -0.3  12/03/2006 0.2 -0.4 -0.2  05/12/2002 0.0 -0.5 --   LDL Particle Number  [id number] (H)    LDL (calc)  112 (H)    HDL-C  52     Triglycerides  94    Cholesterol, Total  183    FRAX 10 year estimate: Total FX risk:  14%  (consider medication if >/= 20%) Hip FX risk:  1.3%  (consider medication if >/= 3%)  Assessment: Osteopenia Hyperlipidemia - intolerant to atorvastatin.  Patient could tolerate Vytorin 10/20mg  bur needs high dose and since she is also on fenofibrate must get cautious of myalgias / rhabdomyalysis  Recommendations: 1.  Reviewed DEXA and discussed fracture risk 2.  continue calcium 1200mg  daily through supplementation or diet.  3.  recommend weight bearing exercise - 30 minutes at least 4 days  per week.   4.  Counseled and educated about fall risk and prevention. 5.  Restart Vytorin 10/40mg  1 tablet daily.  Will decreased fenofibrate to 1/2 tablet of 160mg  daily since last 2 Lipmeds Tg have been less than 100.  Patient instructed to call office if she experiences any muscle aches or pain or dark urine. 6.  Recheck Lipomed panel in 6 weeks  Recheck DEXA:  2 years  Time spent counseling patient:  25 minutes

## 2012-10-21 ENCOUNTER — Other Ambulatory Visit: Payer: Self-pay

## 2012-10-22 ENCOUNTER — Other Ambulatory Visit (INDEPENDENT_AMBULATORY_CARE_PROVIDER_SITE_OTHER): Payer: Medicare Other

## 2012-10-22 DIAGNOSIS — E785 Hyperlipidemia, unspecified: Secondary | ICD-10-CM | POA: Diagnosis not present

## 2012-10-22 LAB — HEPATIC FUNCTION PANEL
Albumin: 4.1 g/dL (ref 3.5–5.2)
Total Bilirubin: 0.5 mg/dL (ref 0.3–1.2)
Total Protein: 6.7 g/dL (ref 6.0–8.3)

## 2012-10-23 LAB — NMR LIPOPROFILE WITH LIPIDS
HDL Particle Number: 40.7 umol/L (ref >=30.5)
HDL Size: 8.6 nm — ABNORMAL LOW (ref >=9.2)
HDL-C: 51 mg/dL (ref >=40)
LDL (calc): 84 mg/dL (ref <100)
LDL Particle Number: 1140 nmol/L — ABNORMAL HIGH (ref <1000)
LDL Size: 20.9 nm (ref >20.5)
LP-IR Score: 52 — ABNORMAL HIGH (ref <=45)
Small LDL Particle Number: 274 nmol/L (ref <=527)
VLDL Size: 42.7 nm (ref <=46.6)

## 2012-10-30 ENCOUNTER — Encounter: Payer: Self-pay | Admitting: Pharmacist

## 2012-10-30 ENCOUNTER — Other Ambulatory Visit: Payer: Self-pay | Admitting: Pharmacist

## 2012-10-30 NOTE — Progress Notes (Signed)
Patient came in for labs only.

## 2012-11-25 ENCOUNTER — Other Ambulatory Visit: Payer: Self-pay | Admitting: Family Medicine

## 2012-11-25 DIAGNOSIS — Z139 Encounter for screening, unspecified: Secondary | ICD-10-CM

## 2012-11-26 ENCOUNTER — Ambulatory Visit (INDEPENDENT_AMBULATORY_CARE_PROVIDER_SITE_OTHER): Payer: Medicare Other | Admitting: Family Medicine

## 2012-11-26 ENCOUNTER — Encounter: Payer: Self-pay | Admitting: Family Medicine

## 2012-11-26 VITALS — BP 125/59 | HR 64 | Temp 97.1°F | Ht 66.0 in | Wt 173.6 lb

## 2012-11-26 DIAGNOSIS — M899 Disorder of bone, unspecified: Secondary | ICD-10-CM

## 2012-11-26 DIAGNOSIS — Z23 Encounter for immunization: Secondary | ICD-10-CM | POA: Insufficient documentation

## 2012-11-26 DIAGNOSIS — M8580 Other specified disorders of bone density and structure, unspecified site: Secondary | ICD-10-CM

## 2012-11-26 DIAGNOSIS — E785 Hyperlipidemia, unspecified: Secondary | ICD-10-CM | POA: Diagnosis not present

## 2012-11-26 DIAGNOSIS — K5730 Diverticulosis of large intestine without perforation or abscess without bleeding: Secondary | ICD-10-CM

## 2012-11-26 DIAGNOSIS — K5790 Diverticulosis of intestine, part unspecified, without perforation or abscess without bleeding: Secondary | ICD-10-CM | POA: Insufficient documentation

## 2012-11-26 MED ORDER — EZETIMIBE-SIMVASTATIN 10-20 MG PO TABS: 1.0000 | ORAL_TABLET | Freq: Every day | ORAL | Status: DC

## 2012-11-26 NOTE — Progress Notes (Signed)
Patient ID: Carol [name], female   DOB: [birth year], 67 y.o.   MRN: [id number] SUBJECTIVE: CC: Chief Complaint  Patient presents with  . Follow-up    4 m Judith Part wants flu shot needs refills     HPI: Patient is here for follow up of hyperlipidemia: denies Headache;denies Chest Pain;denies weakness;denies Shortness of Breath and orthopnea;denies Visual changes;denies palpitations;denies cough;denies pedal edema;denies symptoms of TIA or stroke;deniesClaudication symptoms. admits to Compliance with medications; denies Problems with medications.  Saw Tammy and had medication recommendations. On prsent regiment she is fine. No problems with medications.  Past Medical History  Diagnosis Date  . Hyperlipidemia    No past surgical history on file. History   Social History  . Marital Status: Widowed    Spouse Name: N/A    Number of Children: N/A  . Years of Education: N/A   Occupational History  . Not on file.   Social History Main Topics  . Smoking status: Never Smoker   . Smokeless tobacco: Not on file  . Alcohol Use: Not on file  . Drug Use: Not on file  . Sexual Activity: Not on file   Other Topics Concern  . Not on file   Social History Narrative  . No narrative on file   No family history on file. Current Outpatient Prescriptions on File Prior to Visit  Medication Sig Dispense Refill  . aspirin EC 81 MG tablet 81 mg. 81 mg nightly.      . Calcium 500-125 MG-UNIT TABS 1 tablet. 1 tablet daily.      . Cholecalciferol (PA VITAMIN D-3) 2000 UNITS CAPS 1 capsule. 1 capsule daily.      . Multiple Vitamins tablet 1 tablet. 1 tablet daily.      . OMEGA-3 1000 MG CAPS 2 g 2 times daily.       No current facility-administered medications on file prior to visit.   Allergies  Allergen Reactions  . Penicillins Hives  . Sulfa Antibiotics   . Sulfamethoxazole Itching   Immunization History  Administered Date(s) Administered  . Influenza,inj,Quad PF,36+ Mos 11/26/2012  .  Tdap 02/27/2006   Prior to Admission medications   Medication Sig Start Date End Date Taking? Authorizing Provider  aspirin EC 81 MG tablet 81 mg. 81 mg nightly.   Yes Historical Provider, MD  Calcium 500-125 MG-UNIT TABS 1 tablet. 1 tablet daily.   Yes Historical Provider, MD  Cholecalciferol (PA VITAMIN D-3) 2000 UNITS CAPS 1 capsule. 1 capsule daily.   Yes Historical Provider, MD  ezetimibe-simvastatin (VYTORIN) 10-20 MG per tablet Take 1 tablet by mouth at bedtime.   Yes Historical Provider, MD  fenofibrate 160 MG tablet Take 160 mg by mouth daily.   Yes Historical Provider, MD  Multiple Vitamins tablet 1 tablet. 1 tablet daily.   Yes Historical Provider, MD  OMEGA-3 1000 MG CAPS 2 g 2 times daily.   Yes Historical Provider, MD      ROS: As above in the HPI. All other systems are stable or negative.  OBJECTIVE: APPEARANCE:  Patient in no acute distress.The patient appeared well nourished and normally developed. Acyanotic. Waist: VITAL SIGNS:BP 125/59  Pulse 64  Temp(Src) 97.1 F (36.2 C) (Oral)  Ht 5\' 6"  (1.676 m)  Wt 173 lb 9.6 oz (78.744 kg)  BMI 28.03 kg/m2 WF  SKIN: warm and  Dry without overt rashes, tattoos and scars  HEAD and Neck: without JVD, Head and scalp: normal Eyes:No scleral icterus. Fundi normal, eye movements normal.  Ears: Auricle normal, canal normal, Tympanic membranes normal, insufflation normal. Nose: normal Throat: normal Neck & thyroid: normal  CHEST & LUNGS: Chest wall: normal Lungs: Clear  CVS: Reveals the PMI to be normally located. Regular rhythm, First and Second Heart sounds are normal,  absence of murmurs, rubs or gallops. Peripheral vasculature: Radial pulses: normal Dorsal pedis pulses: normal Posterior pulses: normal  ABDOMEN:  Appearance: normal Benign, no organomegaly, no masses, no Abdominal Aortic enlargement. No Guarding , no rebound. No Bruits. Bowel sounds: normal  RECTAL: N/A GU: N/A  EXTREMETIES:  nonedematous.  MUSCULOSKELETAL:  Spine: normal Joints: intact  NEUROLOGIC: oriented to time,place and person; nonfocal. Strength is normal Sensory is normal Reflexes are normal Cranial Nerves are normal. Results for orders placed in visit on 10/22/12  NMR LIPOPROFILE WITH LIPIDS      Result Value Range   LDL Particle Number [id number] (*) <1000 nmol/L   LDL (calc) 84  <100 mg/dL   HDL-C 51  >=09 mg/dL   Triglycerides 80  <811 mg/dL   Cholesterol, Total 914  <200 mg/dL   HDL Particle Number [id number]  >=95.6 umol/L   Large HDL-P 3.8 (*) >=4.8 umol/L   Large VLDL-P 1.6  <=2.7 nmol/L   Small LDL Particle Number [id number]  <=527 nmol/L   LDL Size 20.9  >20.5 nm   HDL Size 8.6 (*) >=9.2 nm   VLDL Size 42.7  <=46.6 nm   LP-IR Score 52 (*) <=45  HEPATIC FUNCTION PANEL      Result Value Range   Total Bilirubin 0.5  0.3 - 1.2 mg/dL   Bilirubin, Direct 0.1  0.0 - 0.3 mg/dL   Indirect Bilirubin 0.4  0.0 - 0.9 mg/dL   Alkaline Phosphatase 44  39 - 117 U/L   AST 18  0 - 37 U/L   ALT 17  0 - 35 U/L   Total Protein 6.7  6.0 - 8.3 g/dL   Albumin 4.1  3.5 - 5.2 g/dL    ASSESSMENT: HLD (hyperlipidemia)  Need for prophylactic vaccination and inoculation against influenza  Diverticulosis of colon  Osteopenia   PLAN: Continue diet , active lifestyle, exercise,  Same regimen for now.  Reviewed labs with patient.  Zostavax in 4 weeks.  Return in about 4 months (around 03/28/2013) for Recheck medical problems.  Darah Simkin P. [place] Charon, M.D.

## 2012-12-10 ENCOUNTER — Ambulatory Visit (HOSPITAL_COMMUNITY)
Admission: RE | Admit: 2012-12-10 | Discharge: 2012-12-10 | Disposition: A | Discharge status: Home / Self Care | Payer: Medicare Other | Source: Ambulatory Visit | Attending: Family Medicine | Admitting: Family Medicine

## 2012-12-10 DIAGNOSIS — Z139 Encounter for screening, unspecified: Secondary | ICD-10-CM

## 2012-12-10 DIAGNOSIS — Z1231 Encounter for screening mammogram for malignant neoplasm of breast: Secondary | ICD-10-CM | POA: Insufficient documentation

## 2013-01-31 ENCOUNTER — Other Ambulatory Visit: Payer: Self-pay | Admitting: Family Medicine

## 2013-01-31 ENCOUNTER — Telehealth: Payer: Self-pay | Admitting: Family Medicine

## 2013-01-31 DIAGNOSIS — E785 Hyperlipidemia, unspecified: Secondary | ICD-10-CM

## 2013-01-31 MED ORDER — EZETIMIBE-SIMVASTATIN 10-20 MG PO TABS: 1.0000 | ORAL_TABLET | Freq: Every day | ORAL | Status: DC

## 2013-01-31 MED ORDER — FENOFIBRATE 160 MG PO TABS
160.0000 mg | ORAL_TABLET | Freq: Every day | ORAL | Status: DC
Start: 2013-01-31 — End: 2013-03-28

## 2013-01-31 NOTE — Telephone Encounter (Signed)
Rx ready for pick up. 

## 2013-01-31 NOTE — Telephone Encounter (Signed)
Pt aware to pick up rx front desk

## 2013-01-31 NOTE — Telephone Encounter (Signed)
Pt notified rx at front desk

## 2013-03-28 ENCOUNTER — Ambulatory Visit (INDEPENDENT_AMBULATORY_CARE_PROVIDER_SITE_OTHER): Payer: Medicare Other | Admitting: Family Medicine

## 2013-03-28 ENCOUNTER — Encounter: Payer: Self-pay | Admitting: Family Medicine

## 2013-03-28 VITALS — BP 111/59 | HR 59 | Temp 97.8°F | Ht 66.0 in | Wt 175.2 lb

## 2013-03-28 DIAGNOSIS — M899 Disorder of bone, unspecified: Secondary | ICD-10-CM

## 2013-03-28 DIAGNOSIS — M8580 Other specified disorders of bone density and structure, unspecified site: Secondary | ICD-10-CM

## 2013-03-28 DIAGNOSIS — M949 Disorder of cartilage, unspecified: Secondary | ICD-10-CM

## 2013-03-28 DIAGNOSIS — E785 Hyperlipidemia, unspecified: Secondary | ICD-10-CM | POA: Diagnosis not present

## 2013-03-28 NOTE — Patient Instructions (Addendum)
Hypertriglyceridemia  Diet for High blood levels of Triglycerides Most fats in food are triglycerides. Triglycerides in your blood are stored as fat in your body. High levels of triglycerides in your blood may put you at a greater risk for heart disease and stroke.  Normal triglyceride levels are less than 150 mg/dL. Borderline high levels are 150-199 mg/dl. High levels are 200 - 499 mg/dL, and very high triglyceride levels are greater than 500 mg/dL. The decision to treat high triglycerides is generally based on the level. For people with borderline or high triglyceride levels, treatment includes weight loss and exercise. Drugs are recommended for people with very high triglyceride levels. Many people who need treatment for high triglyceride levels have metabolic syndrome. This syndrome is a collection of disorders that often include: insulin resistance, high blood pressure, blood clotting problems, high cholesterol and triglycerides. TESTING PROCEDURE FOR TRIGLYCERIDES  You should not eat 4 hours before getting your triglycerides measured. The normal range of triglycerides is between 10 and 250 milligrams per deciliter (mg/dl). Some people may have extreme levels (1000 or above), but your triglyceride level may be too high if it is above 150 mg/dl, depending on what other risk factors you have for heart disease.  People with high blood triglycerides may also have high blood cholesterol levels. If you have high blood cholesterol as well as high blood triglycerides, your risk for heart disease is probably greater than if you only had high triglycerides. High blood cholesterol is one of the main risk factors for heart disease. CHANGING YOUR DIET  Your weight can affect your blood triglyceride level. If you are more than 20% above your ideal body weight, you may be able to lower your blood triglycerides by losing weight. Eating less and exercising regularly is the best way to combat this. Fat provides more  calories than any other food. The best way to lose weight is to eat less fat. Only 30% of your total calories should come from fat. Less than 7% of your diet should come from saturated fat. A diet low in fat and saturated fat is the same as a diet to decrease blood cholesterol. By eating a diet lower in fat, you may lose weight, lower your blood cholesterol, and lower your blood triglyceride level.  Eating a diet low in fat, especially saturated fat, may also help you lower your blood triglyceride level. Ask your dietitian to help you figure how much fat you can eat based on the number of calories your caregiver has prescribed for you.  Exercise, in addition to helping with weight loss may also help lower triglyceride levels.   Alcohol can increase blood triglycerides. You may need to stop drinking alcoholic beverages.  Too much carbohydrate in your diet may also increase your blood triglycerides. Some complex carbohydrates are necessary in your diet. These may include bread, rice, potatoes, other starchy vegetables and cereals.  Reduce "simple" carbohydrates. These may include pure sugars, candy, honey, and jelly without losing other nutrients. If you have the kind of high blood triglycerides that is affected by the amount of carbohydrates in your diet, you will need to eat less sugar and less high-sugar foods. Your caregiver can help you with this.  Adding 2-4 grams of fish oil (EPA+ DHA) may also help lower triglycerides. Speak with your caregiver before adding any supplements to your regimen. Following the Diet  Maintain your ideal weight. Your caregivers can help you with a diet. Generally, eating less food and getting more   exercise will help you lose weight. Joining a weight control group may also help. Ask your caregivers for a good weight control group in your area.  Eat low-fat foods instead of high-fat foods. This can help you lose weight too.  These foods are lower in fat. Eat MORE of these:    Dried beans, peas, and lentils.  Egg whites.  Low-fat cottage cheese.  Fish.  Lean cuts of meat, such as round, sirloin, rump, and flank (cut extra fat off meat you fix).  Whole grain breads, cereals and pasta.  Skim and nonfat dry milk.  Low-fat yogurt.  Poultry without the skin.  Cheese made with skim or part-skim milk, such as mozzarella, parmesan, farmers', ricotta, or pot cheese. These are higher fat foods. Eat LESS of these:   Whole milk and foods made from whole milk, such as American, blue, cheddar, monterey jack, and swiss cheese  High-fat meats, such as luncheon meats, sausages, knockwurst, bratwurst, hot dogs, ribs, corned beef, ground pork, and regular ground beef.  Fried foods. Limit saturated fats in your diet. Substituting unsaturated fat for saturated fat may decrease your blood triglyceride level. You will need to read package labels to know which products contain saturated fats.  These foods are high in saturated fat. Eat LESS of these:   Fried pork skins.  Whole milk.  Skin and fat from poultry.  Palm oil.  Butter.  Shortening.  Cream cheese.  Bacon.  Margarines and baked goods made from listed oils.  Vegetable shortenings.  Chitterlings.  Fat from meats.  Coconut oil.  Palm kernel oil.  Lard.  Cream.  Sour cream.  Fatback.  Coffee whiteners and non-dairy creamers made with these oils.  Cheese made from whole milk. Use unsaturated fats (both polyunsaturated and monounsaturated) moderately. Remember, even though unsaturated fats are better than saturated fats; you still want a diet low in total fat.  These foods are high in unsaturated fat:   Canola oil.  Sunflower oil.  Mayonnaise.  Almonds.  Peanuts.  Pine nuts.  Margarines made with these oils.  Safflower oil.  Olive oil.  Avocados.  Cashews.  Peanut butter.  Sunflower seeds.  Soybean oil.  Peanut  oil.  Olives.  Pecans.  Walnuts.  Pumpkin seeds. Avoid sugar and other high-sugar foods. This will decrease carbohydrates without decreasing other nutrients. Sugar in your food goes rapidly to your blood. When there is excess sugar in your blood, your liver may use it to make more triglycerides. Sugar also contains calories without other important nutrients.  Eat LESS of these:   Sugar, brown sugar, powdered sugar, jam, jelly, preserves, honey, syrup, molasses, pies, candy, cakes, cookies, frosting, pastries, colas, soft drinks, punches, fruit drinks, and regular gelatin.  Avoid alcohol. Alcohol, even more than sugar, may increase blood triglycerides. In addition, alcohol is high in calories and low in nutrients. Ask for sparkling water, or a diet soft drink instead of an alcoholic beverage. Suggestions for planning and preparing meals   Bake, broil, grill or roast meats instead of frying.  Remove fat from meats and skin from poultry before cooking.  Add spices, herbs, lemon juice or vinegar to vegetables instead of salt, rich sauces or gravies.  Use a non-stick skillet without fat or use no-stick sprays.  Cool and refrigerate stews and broth. Then remove the hardened fat floating on the surface before serving.  Refrigerate meat drippings and skim off fat to make low-fat gravies.  Serve more fish.  Use less butter,   margarine and other high-fat spreads on bread or vegetables.  Use skim or reconstituted non-fat dry milk for cooking.  Cook with low-fat cheeses.  Substitute low-fat yogurt or cottage cheese for all or part of the sour cream in recipes for sauces, dips or congealed salads.  Use half yogurt/half mayonnaise in salad recipes.  Substitute evaporated skim milk for cream. Evaporated skim milk or reconstituted non-fat dry milk can be whipped and substituted for whipped cream in certain recipes.  Choose fresh fruits for dessert instead of high-fat foods such as pies or  cakes. Fruits are naturally low in fat. When Dining Out   Order low-fat appetizers such as fruit or vegetable juice, pasta with vegetables or tomato sauce.  Select clear, rather than cream soups.  Ask that dressings and gravies be served on the side. Then use less of them.  Order foods that are baked, broiled, poached, steamed, stir-fried, or roasted.  Ask for margarine instead of butter, and use only a small amount.  Drink sparkling water, unsweetened tea or coffee, or diet soft drinks instead of alcohol or other sweet beverages. QUESTIONS AND ANSWERS ABOUT OTHER FATS IN THE BLOOD: SATURATED FAT, TRANS FAT, AND CHOLESTEROL What is trans fat? Trans fat is a type of fat that is formed when vegetable oil is hardened through a process called hydrogenation. This process helps makes foods more solid, gives them shape, and prolongs their shelf life. Trans fats are also called hydrogenated or partially hydrogenated oils.  What do saturated fat, trans fat, and cholesterol in foods have to do with heart disease? Saturated fat, trans fat, and cholesterol in the diet all raise the level of LDL "bad" cholesterol in the blood. The higher the LDL cholesterol, the greater the risk for coronary heart disease (CHD). Saturated fat and trans fat raise LDL similarly.  What foods contain saturated fat, trans fat, and cholesterol? High amounts of saturated fat are found in animal products, such as fatty cuts of meat, chicken skin, and full-fat dairy products like butter, whole milk, cream, and cheese, and in tropical vegetable oils such as palm, palm kernel, and coconut oil. Trans fat is found in some of the same foods as saturated fat, such as vegetable shortening, some margarines (especially hard or stick margarine), crackers, cookies, baked goods, fried foods, salad dressings, and other processed foods made with partially hydrogenated vegetable oils. Small amounts of trans fat also occur naturally in some animal  products, such as milk products, beef, and lamb. Foods high in cholesterol include liver, other organ meats, egg yolks, shrimp, and full-fat dairy products. How can I use the new food label to make heart-healthy food choices? Check the Nutrition Facts panel of the food label. Choose foods lower in saturated fat, trans fat, and cholesterol. For saturated fat and cholesterol, you can also use the Percent Daily Value (%DV): 5% DV or less is low, and 20% DV or more is high. (There is no %DV for trans fat.) Use the Nutrition Facts panel to choose foods low in saturated fat and cholesterol, and if the trans fat is not listed, read the ingredients and limit products that list shortening or hydrogenated or partially hydrogenated vegetable oil, which tend to be high in trans fat. POINTS TO REMEMBER:   Discuss your risk for heart disease with your caregivers, and take steps to reduce risk factors.  Change your diet. Choose foods that are low in saturated fat, trans fat, and cholesterol.  Add exercise to your daily routine if   it is not already being done. Participate in physical activity of moderate intensity, like brisk walking, for at least 30 minutes on most, and preferably all days of the week. No time? Break the 30 minutes into three, 10-minute segments during the day.  Stop smoking. If you do smoke, contact your caregiver to discuss ways in which they can help you quit.  Do not use street drugs.  Maintain a normal weight.  Maintain a healthy blood pressure.  Keep up with your blood work for checking the fats in your blood as directed by your caregiver. Document Released: 12/02/2003 Document Revised: 08/15/2011 Document Reviewed: 06/29/2008 [facility] Patient Information 2014 [place].        Dr Paula Libra Recommendations  For nutrition information, I recommend books:  1).Eat to Live by Dr Excell Seltzer. 2).Prevent and Reverse Heart Disease by Dr Karl Luke. 3) Dr Janene Harvey Book:  Program to Reverse Diabetes  Exercise recommendations are:  If unable to walk, then the patient can exercise in a chair 3 times a day. By flapping arms like a bird gently and raising legs outwards to the front.  If ambulatory, the patient can go for walks for 30 minutes 3 times a week. Then increase the intensity and duration as tolerated.  Goal is to try to attain exercise frequency to 5 times a week.  If applicable: Best to perform resistance exercises (machines or weights) 2 days a week and cardio type exercises 3 days per week.

## 2013-03-28 NOTE — Progress Notes (Signed)
Patient ID: Carol [name], female   DOB: 1945/07/17, 68 y.o.   MRN: [id number] SUBJECTIVE: CC: Chief Complaint  Patient presents with  . Follow-up    4 month follow up chronic problems    HPI: Patient is here for follow up of hyperlipidemia: denies Headache;denies Chest Pain;denies weakness;denies Shortness of Breath and orthopnea;denies Visual changes;denies palpitations;denies cough;denies pedal edema;denies symptoms of TIA or stroke;deniesClaudication symptoms. admits to Compliance with medications; denies Problems with medications.stopped the fenofibrate due to intolerance  Has been well without the fenofibrate.  Past Medical History  Diagnosis Date  . Hyperlipidemia   . Osteopenia   . Diverticulosis    No past surgical history on file. History   Social History  . Marital Status: Widowed    Spouse Name: N/A    Number of Children: N/A  . Years of Education: N/A   Occupational History  . Not on file.   Social History Main Topics  . Smoking status: Never Smoker   . Smokeless tobacco: Not on file  . Alcohol Use: No  . Drug Use: No  . Sexual Activity: Not on file   Other Topics Concern  . Not on file   Social History Narrative  . No narrative on file   Family History  Problem Relation Age of Onset  . Heart disease Mother   . Heart disease Father   . Cancer Sister     breast  . Diabetes Sister    Current Outpatient Prescriptions on File Prior to Visit  Medication Sig Dispense Refill  . aspirin EC 81 MG tablet 81 mg. 81 mg nightly.      . Calcium 500-125 MG-UNIT TABS 1 tablet. 1 tablet daily.      . Cholecalciferol (PA VITAMIN D-3) 2000 UNITS CAPS 1 capsule. 1 capsule daily.      Marland Kitchen ezetimibe-simvastatin (VYTORIN) 10-20 MG per tablet Take 1 tablet by mouth at bedtime.  90 tablet  3  . Multiple Vitamins tablet 1 tablet. 1 tablet daily.      . OMEGA-3 1000 MG CAPS 2 g 2 times daily.       No current facility-administered medications on file prior to visit.    Allergies  Allergen Reactions  . Penicillins Hives  . Sulfa Antibiotics   . Sulfamethoxazole Itching   Immunization History  Administered Date(s) Administered  . Influenza,inj,Quad PF,36+ Mos 11/26/2012  . Tdap 02/27/2006   Prior to Admission medications   Medication Sig Start Date End Date Taking? Authorizing Provider  aspirin EC 81 MG tablet 81 mg. 81 mg nightly.   Yes Historical Provider, MD  Calcium 500-125 MG-UNIT TABS 1 tablet. 1 tablet daily.   Yes Historical Provider, MD  Cholecalciferol (PA VITAMIN D-3) 2000 UNITS CAPS 1 capsule. 1 capsule daily.   Yes Historical Provider, MD  ezetimibe-simvastatin (VYTORIN) 10-20 MG per tablet Take 1 tablet by mouth at bedtime. 01/31/13  Yes Ileana Ladd, MD  fenofibrate 160 MG tablet Take 1 tablet (160 mg total) by mouth daily. 01/31/13  Yes Ileana Ladd, MD  Multiple Vitamins tablet 1 tablet. 1 tablet daily.   Yes Historical Provider, MD  OMEGA-3 1000 MG CAPS 2 g 2 times daily.   Yes Historical Provider, MD     ROS: As above in the HPI. All other systems are stable or negative.  OBJECTIVE: APPEARANCE:  Patient in no acute distress.The patient appeared well nourished and normally developed. Acyanotic. Waist: VITAL SIGNS:BP 111/59  Pulse 59  Temp(Src) 97.8 F (36.6  C) (Oral)  Ht $R'5\' 6"'XU$  (1.676 m)  Wt 175 lb 3.2 oz (79.47 kg)  BMI 28.29 kg/m2  WF  SKIN: warm and  Dry without overt rashes, tattoos and scars  HEAD and Neck: without JVD, Head and scalp: normal Eyes:No scleral icterus. Fundi normal, eye movements normal. Ears: Auricle normal, canal normal, Tympanic membranes normal, insufflation normal. Nose: normal Throat: normal Neck & thyroid: normal  CHEST & LUNGS: Chest wall: normal Lungs: Clear  CVS: Reveals the PMI to be normally located. Regular rhythm, First and Second Heart sounds are normal,  absence of murmurs, rubs or gallops. Peripheral vasculature: Radial pulses: normal Dorsal pedis pulses:  normal Posterior pulses: normal  ABDOMEN:  Appearance: normal Benign, no organomegaly, no masses, no Abdominal Aortic enlargement. No Guarding , no rebound. No Bruits. Bowel sounds: normal  RECTAL: N/A GU: N/A  EXTREMETIES: nonedematous.  MUSCULOSKELETAL:  Spine: normal Joints: intact  NEUROLOGIC: oriented to time,place and person; nonfocal. Strength is normal Sensory is normal Reflexes are normal Cranial Nerves are normal.   Results for orders placed in visit on 10/22/12  NMR LIPOPROFILE WITH LIPIDS      Result Value Range   LDL Particle Number 1140 (*) <1000 nmol/L   LDL (calc) 84  <100 mg/dL   HDL-C 51  >=40 mg/dL   Triglycerides 80  <150 mg/dL   Cholesterol, Total 151  <200 mg/dL   HDL Particle Number 40.7  >=30.5 umol/L   Large HDL-P 3.8 (*) >=4.8 umol/L   Large VLDL-P 1.6  <=2.7 nmol/L   Small LDL Particle Number 274  <=527 nmol/L   LDL Size 20.9  >20.5 nm   HDL Size 8.6 (*) >=9.2 nm   VLDL Size 42.7  <=46.6 nm   LP-IR Score 52 (*) <=45  HEPATIC FUNCTION PANEL      Result Value Range   Total Bilirubin 0.5  0.3 - 1.2 mg/dL   Bilirubin, Direct 0.1  0.0 - 0.3 mg/dL   Indirect Bilirubin 0.4  0.0 - 0.9 mg/dL   Alkaline Phosphatase 44  39 - 117 U/L   AST 18  0 - 37 U/L   ALT 17  0 - 35 U/L   Total Protein 6.7  6.0 - 8.3 g/dL   Albumin 4.1  3.5 - 5.2 g/dL    ASSESSMENT: HLD (hyperlipidemia) - Plan: CMP14+EGFR, NMR, lipoprofile  Osteopenia - Plan: Vit D  25 hydroxy (rtn osteoporosis monitoring)  PLAN:      Dr Paula Libra Recommendations  For nutrition information, I recommend books:  1).Eat to Live by Dr Excell Seltzer. 2).Prevent and Reverse Heart Disease by Dr Karl Luke. 3) Dr Janene Harvey Book:  Program to Reverse Diabetes  Exercise recommendations are:  If unable to walk, then the patient can exercise in a chair 3 times a day. By flapping arms like a bird gently and raising legs outwards to the front.  If ambulatory, the patient can  go for walks for 30 minutes 3 times a week. Then increase the intensity and duration as tolerated.  Goal is to try to attain exercise frequency to 5 times a week.  If applicable: Best to perform resistance exercises (machines or weights) 2 days a week and cardio type exercises 3 days per week.  Handouts in the AVS.   Continue with healthy lifestyle. Shift to plant based  diet Orders Placed This Encounter  Procedures  . CMP14+EGFR  . NMR, lipoprofile  . Vit D  25 hydroxy (rtn osteoporosis monitoring)  No orders of the defined types were placed in this encounter.   Medications Discontinued During This Encounter  Medication Reason  . fenofibrate 160 MG tablet Side effect (s)   Return in about 4 months (around 07/26/2013) for Recheck medical problems.  Matty Vanroekel P. Jacelyn Grip, M.D.

## 2013-03-30 ENCOUNTER — Other Ambulatory Visit: Payer: Self-pay | Admitting: Family Medicine

## 2013-03-30 DIAGNOSIS — R899 Unspecified abnormal finding in specimens from other organs, systems and tissues: Secondary | ICD-10-CM

## 2013-03-30 DIAGNOSIS — E559 Vitamin D deficiency, unspecified: Secondary | ICD-10-CM

## 2013-03-30 LAB — NMR, LIPOPROFILE
Cholesterol: 175 mg/dL (ref <200)
HDL Cholesterol by NMR: 62 mg/dL (ref >=40)
HDL Particle Number: 48.2 umol/L (ref >=30.5)
LDL Particle Number: 1534 nmol/L — ABNORMAL HIGH (ref <1000)
LDL Size: 21 nm (ref >20.5)
LDLC SERPL CALC-MCNC: 95 mg/dL (ref <100)
LP-IR Score: 61 — ABNORMAL HIGH (ref <=45)
Small LDL Particle Number: 715 nmol/L — ABNORMAL HIGH (ref <=527)
Triglycerides by NMR: 90 mg/dL (ref <150)

## 2013-03-30 LAB — CMP14+EGFR
ALT: 19 IU/L (ref 0–32)
AST: 24 IU/L (ref 0–40)
Albumin/Globulin Ratio: 1.7 (ref 1.1–2.5)
Albumin: 4.5 g/dL (ref 3.6–4.8)
Alkaline Phosphatase: 47 IU/L (ref 39–117)
BUN/Creatinine Ratio: 16 (ref 11–26)
BUN: 18 mg/dL (ref 8–27)
CO2: 25 mmol/L (ref 18–29)
Calcium: 9.6 mg/dL (ref 8.7–10.3)
Chloride: 105 mmol/L (ref 97–108)
Creatinine, Ser: 1.15 mg/dL — ABNORMAL HIGH (ref 0.57–1.00)
GFR calc Af Amer: 56 mL/min/{1.73_m2} — ABNORMAL LOW (ref >59)
GFR calc non Af Amer: 49 mL/min/{1.73_m2} — ABNORMAL LOW (ref >59)
Globulin, Total: 2.7 g/dL (ref 1.5–4.5)
Glucose: 102 mg/dL — ABNORMAL HIGH (ref 65–99)
Potassium: 4.7 mmol/L (ref 3.5–5.2)
Sodium: 145 mmol/L — ABNORMAL HIGH (ref 134–144)
Total Bilirubin: 0.6 mg/dL (ref 0.0–1.2)
Total Protein: 7.2 g/dL (ref 6.0–8.5)

## 2013-03-30 LAB — VITAMIN D 25 HYDROXY (VIT D DEFICIENCY, FRACTURES): Vit D, 25-Hydroxy: 29.4 ng/mL — ABNORMAL LOW (ref 30.0–100.0)

## 2013-03-30 NOTE — Progress Notes (Signed)
Quick Note:  Call Patient Labs that are abnormal: Creatinine / kidney function went up a little but could be significant. The vit D is borderline Low The lipids are acceptable  The rest are at goal  Recommendations: Ensure not missing any of the vit d doses Plant based Diet, exercise and adequate hydration. Would like her to recheck the vit D and the kidney function in 6 weeks.ordered lab visit in [facility].   ______

## 2013-04-23 DIAGNOSIS — L570 Actinic keratosis: Secondary | ICD-10-CM | POA: Diagnosis not present

## 2013-04-23 DIAGNOSIS — L821 Other seborrheic keratosis: Secondary | ICD-10-CM | POA: Diagnosis not present

## 2013-04-23 DIAGNOSIS — D239 Other benign neoplasm of skin, unspecified: Secondary | ICD-10-CM | POA: Diagnosis not present

## 2013-07-16 DIAGNOSIS — H40019 Open angle with borderline findings, low risk, unspecified eye: Secondary | ICD-10-CM | POA: Diagnosis not present

## 2013-07-16 DIAGNOSIS — Z961 Presence of intraocular lens: Secondary | ICD-10-CM | POA: Diagnosis not present

## 2013-07-28 ENCOUNTER — Ambulatory Visit (INDEPENDENT_AMBULATORY_CARE_PROVIDER_SITE_OTHER): Payer: Medicare Other | Admitting: Family Medicine

## 2013-07-28 ENCOUNTER — Ambulatory Visit (INDEPENDENT_AMBULATORY_CARE_PROVIDER_SITE_OTHER): Payer: Medicare Other

## 2013-07-28 ENCOUNTER — Encounter: Payer: Self-pay | Admitting: Family Medicine

## 2013-07-28 VITALS — BP 131/69 | HR 53 | Temp 97.4°F | Ht 66.0 in | Wt 174.0 lb

## 2013-07-28 DIAGNOSIS — R1032 Left lower quadrant pain: Secondary | ICD-10-CM

## 2013-07-28 DIAGNOSIS — Z0000 Encounter for general adult medical examination without abnormal findings: Secondary | ICD-10-CM

## 2013-07-28 DIAGNOSIS — E559 Vitamin D deficiency, unspecified: Secondary | ICD-10-CM

## 2013-07-28 DIAGNOSIS — Z8719 Personal history of other diseases of the digestive system: Secondary | ICD-10-CM

## 2013-07-28 DIAGNOSIS — M8580 Other specified disorders of bone density and structure, unspecified site: Secondary | ICD-10-CM

## 2013-07-28 DIAGNOSIS — M899 Disorder of bone, unspecified: Secondary | ICD-10-CM

## 2013-07-28 DIAGNOSIS — M533 Sacrococcygeal disorders, not elsewhere classified: Secondary | ICD-10-CM

## 2013-07-28 DIAGNOSIS — E785 Hyperlipidemia, unspecified: Secondary | ICD-10-CM

## 2013-07-28 DIAGNOSIS — Z8249 Family history of ischemic heart disease and other diseases of the circulatory system: Secondary | ICD-10-CM | POA: Insufficient documentation

## 2013-07-28 DIAGNOSIS — M949 Disorder of cartilage, unspecified: Secondary | ICD-10-CM

## 2013-07-28 NOTE — Progress Notes (Signed)
Subjective:    Patient ID: Carol [name], female    DOB: 1946/02/28, 68 y.o.   MRN: [id number]  HPI Pt here for follow up and management of chronic medical problems.           Patient Active Problem List   Diagnosis Date Noted  . Need for prophylactic vaccination and inoculation against influenza 11/26/2012  . Diverticulosis   . Osteopenia 09/11/2012  . HLD (hyperlipidemia) 07/23/2012  . Diverticulosis of colon 01/02/2012  . H/O infectious disease 01/02/2012  . Pseudoptosis 11/07/2011   Outpatient Encounter Prescriptions as of 07/28/2013  Medication Sig  . aspirin EC 81 MG tablet 81 mg. 81 mg nightly.  . Calcium 500-125 MG-UNIT TABS 1 tablet. 1 tablet daily.  . Cholecalciferol (PA VITAMIN D-3) 2000 UNITS CAPS 1 capsule. 1 capsule daily.  Marland Kitchen ezetimibe-simvastatin (VYTORIN) 10-20 MG per tablet Take 1 tablet by mouth at bedtime.  . Multiple Vitamins tablet 1 tablet. 1 tablet daily.  . OMEGA-3 1000 MG CAPS 2 g 2 times daily.    Review of Systems  Constitutional: Negative.   HENT: Negative.   Eyes: Negative.   Respiratory: Negative.   Cardiovascular: Negative.   Gastrointestinal: Positive for abdominal pain (soreness- hx of diverticulitis).  Endocrine: Negative.   Genitourinary: Negative.   Musculoskeletal: Negative.   Skin: Negative.        Sore place on sacrum  Allergic/Immunologic: Negative.   Neurological: Negative.   Hematological: Negative.   Psychiatric/Behavioral: Negative.        Objective:   Physical Exam  Nursing note and vitals reviewed. Constitutional: She is oriented to person, place, and time. She appears well-developed and well-nourished. No distress.  The patient is alert and cooperative.  HENT:  Head: Normocephalic and atraumatic.  Right Ear: External ear normal.  Left Ear: External ear normal.  Mouth/Throat: Oropharynx is clear and moist.  Nasal congestion bilateral  Eyes: Conjunctivae and EOM are normal. Pupils are equal, round, and  reactive to light. Right eye exhibits no discharge. Left eye exhibits no discharge. No scleral icterus.  Neck: Normal range of motion. Neck supple. No thyromegaly present.  Cardiovascular: Normal rate, regular rhythm, normal heart sounds and intact distal pulses.  Exam reveals no gallop and no friction rub.   No murmur heard. At 60 per minute  Pulmonary/Chest: Effort normal and breath sounds normal. No respiratory distress. She has no wheezes. She has no rales. She exhibits no tenderness.  Abdominal: Soft. Bowel sounds are normal. She exhibits no mass. There is tenderness. There is no rebound and no guarding.  There was slight tenderness in the left lower quadrant. There were no inguinal nodes palpable.  Musculoskeletal: Normal range of motion. She exhibits no edema and no tenderness.  Lymphadenopathy:    She has no cervical adenopathy.  Neurological: She is alert and oriented to person, place, and time. She has normal reflexes. No cranial nerve deficit.  Skin: Skin is warm and dry. No rash noted.  Psychiatric: She has a normal mood and affect. Her behavior is normal. Judgment and thought content normal.   BP 131/69  Pulse 53  Temp(Src) 97.4 F (36.3 C) (Oral)  Ht 5' 6" (1.676 m)  Wt 174 lb (78.926 kg)  BMI 28.10 kg/m2  EKG: Within normal limits   [facility] reading (PRIMARY) by  Dr. Brunilda Payor x-ray-no active disease                                                                    -  Sacral-degenerative changes                                          Assessment & Plan:  1. HLD (hyperlipidemia) - POCT CBC; Future - BMP8+EGFR; Future - Hepatic function panel; Future - NMR, lipoprofile; Future - DG Chest 2 View; Future  2. Osteopenia - POCT CBC; Future - Vit D  25 hydroxy (rtn osteoporosis monitoring); Future - DG Chest 2 View; Future  3. LLQ pain - POCT CBC; Future - POCT urinalysis dipstick - POCT UA - Microscopic Only  4. Health care maintenance - DG Chest 2 View;  Future  5. Unspecified vitamin D deficiency  6. History of diverticulitis  7. Sacral pain  8. Family history of heart disease   Patient Instructions                       Medicare Annual Wellness Visit  [place] and the medical providers at [facility] strive to bring you the best medical care.  In doing so we not only want to address your current medical conditions and concerns but also to detect new conditions early and prevent illness, disease and health-related problems.    Medicare offers a yearly Wellness Visit which allows our clinical staff to assess your need for preventative services including immunizations, lifestyle education, counseling to decrease risk of preventable diseases and screening for fall risk and other medical concerns.    This visit is provided free of charge (no copay) for all Medicare recipients. The clinical pharmacists at [facility] have begun to conduct these Wellness Visits which will also include a thorough review of all your medications.    As you primary medical provider recommend that you make an appointment for your Annual Wellness Visit if you have not done so already this year.  You may set up this appointment before you leave today or you may call back ([phone]) and schedule an appointment.  Please make sure when you call that you mention that you are scheduling your Annual Wellness Visit with the clinical pharmacist so that the appointment may be made for the proper length of time.       Continue current medications. Continue good therapeutic lifestyle changes which include good diet and exercise. Fall precautions discussed with patient. If an FOBT was given today- please return it to our front desk. If you are over 50 years old - you may need Prevnar 13 or the adult Pneumonia vaccine.  Continue to walk and exercise regularly We will Call you with the results of the x-rays once those results  are available Using a donut cushion to see if this will relieve some of the discomfort with sitting   Don W. Moore MD   

## 2013-07-28 NOTE — Patient Instructions (Addendum)
Medicare Annual Wellness Visit  [place] and the medical providers at [facility] strive to bring you the best medical care.  In doing so we not only want to address your current medical conditions and concerns but also to detect new conditions early and prevent illness, disease and health-related problems.    Medicare offers a yearly Wellness Visit which allows our clinical staff to assess your need for preventative services including immunizations, lifestyle education, counseling to decrease risk of preventable diseases and screening for fall risk and other medical concerns.    This visit is provided free of charge (no copay) for all Medicare recipients. The clinical pharmacists at [facility] have begun to conduct these Wellness Visits which will also include a thorough review of all your medications.    As you primary medical provider recommend that you make an appointment for your Annual Wellness Visit if you have not done so already this year.  You may set up this appointment before you leave today or you may call back ([phone]) and schedule an appointment.  Please make sure when you call that you mention that you are scheduling your Annual Wellness Visit with the clinical pharmacist so that the appointment may be made for the proper length of time.       Continue current medications. Continue good therapeutic lifestyle changes which include good diet and exercise. Fall precautions discussed with patient. If an FOBT was given today- please return it to our front desk. If you are over 42 years old - you may need Prevnar 74 or the adult Pneumonia vaccine.  Continue to walk and exercise regularly We will Call you with the results of the x-rays once those results are available Using a donut cushion to see if this will relieve some of the discomfort with sitting

## 2013-07-29 ENCOUNTER — Other Ambulatory Visit (INDEPENDENT_AMBULATORY_CARE_PROVIDER_SITE_OTHER): Payer: Medicare Other

## 2013-07-29 DIAGNOSIS — E785 Hyperlipidemia, unspecified: Secondary | ICD-10-CM | POA: Diagnosis not present

## 2013-07-29 DIAGNOSIS — M8580 Other specified disorders of bone density and structure, unspecified site: Secondary | ICD-10-CM

## 2013-07-29 DIAGNOSIS — M949 Disorder of cartilage, unspecified: Secondary | ICD-10-CM | POA: Diagnosis not present

## 2013-07-29 DIAGNOSIS — M899 Disorder of bone, unspecified: Secondary | ICD-10-CM

## 2013-07-29 DIAGNOSIS — R1032 Left lower quadrant pain: Secondary | ICD-10-CM | POA: Diagnosis not present

## 2013-07-29 LAB — POCT CBC
GRANULOCYTE PERCENT: 4.2 % — AB (ref 37–80)
HEMATOCRIT: 40.7 % (ref 37.7–47.9)
Hemoglobin: 13 g/dL (ref 12.2–16.2)
Lymph, poc: 2.1 (ref 0.6–3.4)
MCH, POC: 28.9 pg (ref 27–31.2)
MCHC: 31.8 g/dL (ref 31.8–35.4)
MCV: 90.9 fL (ref 80–97)
MPV: 8.5 fL (ref 0–99.8)
POC GRANULOCYTE: 4.2 (ref 2–6.9)
POC LYMPH PERCENT: 31.5 %L (ref 10–50)
Platelet Count, POC: 204 10*3/uL (ref 142–424)
RBC: 4.5 M/uL (ref 4.04–5.48)
RDW, POC: 12.8 %
WBC: 6.7 10*3/uL (ref 4.6–10.2)

## 2013-07-29 NOTE — Progress Notes (Signed)
Pt came in for labs only 

## 2013-07-30 ENCOUNTER — Other Ambulatory Visit: Payer: Medicare Other

## 2013-07-30 DIAGNOSIS — Z1212 Encounter for screening for malignant neoplasm of rectum: Secondary | ICD-10-CM

## 2013-07-30 LAB — HEPATIC FUNCTION PANEL
ALBUMIN: 4.5 g/dL (ref 3.6–4.8)
ALK PHOS: 46 IU/L (ref 39–117)
ALT: 15 IU/L (ref 0–32)
AST: 18 IU/L (ref 0–40)
Bilirubin, Direct: 0.16 mg/dL (ref 0.00–0.40)
Total Bilirubin: 0.6 mg/dL (ref 0.0–1.2)
Total Protein: 6.8 g/dL (ref 6.0–8.5)

## 2013-07-30 LAB — BMP8+EGFR
BUN/Creatinine Ratio: 15 (ref 11–26)
BUN: 16 mg/dL (ref 8–27)
CHLORIDE: 108 mmol/L (ref 97–108)
CO2: 26 mmol/L (ref 18–29)
CREATININE: 1.09 mg/dL — AB (ref 0.57–1.00)
Calcium: 9.3 mg/dL (ref 8.7–10.3)
GFR calc Af Amer: 60 mL/min/{1.73_m2} (ref >59)
GFR calc non Af Amer: 52 mL/min/{1.73_m2} — ABNORMAL LOW (ref >59)
GLUCOSE: 93 mg/dL (ref 65–99)
Potassium: 5 mmol/L (ref 3.5–5.2)
Sodium: 145 mmol/L — ABNORMAL HIGH (ref 134–144)

## 2013-07-30 LAB — NMR, LIPOPROFILE
Cholesterol: 159 mg/dL (ref 100–199)
HDL CHOLESTEROL BY NMR: 55 mg/dL (ref >39)
HDL PARTICLE NUMBER: 40.9 umol/L (ref >=30.5)
LDL Particle Number: 1064 nmol/L — ABNORMAL HIGH (ref <1000)
LDL Size: 21 nm (ref >20.5)
LDLC SERPL CALC-MCNC: 87 mg/dL (ref 0–99)
LP-IR Score: 41 (ref <=45)
Small LDL Particle Number: 476 nmol/L (ref <=527)
Triglycerides by NMR: 87 mg/dL (ref 0–149)

## 2013-07-30 LAB — VITAMIN D 25 HYDROXY (VIT D DEFICIENCY, FRACTURES): VIT D 25 HYDROXY: 35.9 ng/mL (ref 30.0–100.0)

## 2013-07-31 LAB — FECAL OCCULT BLOOD, IMMUNOCHEMICAL: Fecal Occult Bld: POSITIVE — AB

## 2013-08-01 ENCOUNTER — Telehealth: Payer: Self-pay | Admitting: Family Medicine

## 2013-08-01 NOTE — Telephone Encounter (Signed)
Pt aware of lab results.  rs °

## 2013-08-01 NOTE — Telephone Encounter (Signed)
Message copied by Waverly Ferrari on Fri Aug 01, 2013 11:58 AM ------      Message from: Chipper Herb      Created: Wed Jul 30, 2013  7:30 AM       The blood sugar is good at 93. The creatinine remains slightly elevated at 1.09. This is slightly lower than it was 4 months ago. We should continue to monitor this in the future. The GFR was also slightly decreased. The sodium was slightly increased but this is okay. The potassium is within normal limits.      Please continue to avoid NSAIDs as these can raise the creatinine.      All liver function tests are within normal limits      With advanced lipid testing a total LDL particle number continues to come down it is now 1064. Previously it was 1534. The LDL C. is good at 87. The triglycerides are excellent at 87. Continue aggressive therapeutic lifestyle changes and Vytorin      The vitamin D level is at the low end of the normal range. Make sure the patient continues her current treatment. ------

## 2013-08-14 ENCOUNTER — Other Ambulatory Visit: Payer: Medicare Other

## 2013-08-14 ENCOUNTER — Telehealth: Payer: Self-pay | Admitting: Family Medicine

## 2013-08-14 DIAGNOSIS — E559 Vitamin D deficiency, unspecified: Secondary | ICD-10-CM | POA: Diagnosis not present

## 2013-08-14 DIAGNOSIS — Z8249 Family history of ischemic heart disease and other diseases of the circulatory system: Secondary | ICD-10-CM | POA: Diagnosis not present

## 2013-08-14 DIAGNOSIS — E785 Hyperlipidemia, unspecified: Secondary | ICD-10-CM | POA: Diagnosis not present

## 2013-08-14 DIAGNOSIS — Z8719 Personal history of other diseases of the digestive system: Secondary | ICD-10-CM | POA: Diagnosis not present

## 2013-08-14 DIAGNOSIS — Z0000 Encounter for general adult medical examination without abnormal findings: Secondary | ICD-10-CM | POA: Diagnosis not present

## 2013-08-14 DIAGNOSIS — M533 Sacrococcygeal disorders, not elsewhere classified: Secondary | ICD-10-CM | POA: Diagnosis not present

## 2013-08-14 DIAGNOSIS — R1032 Left lower quadrant pain: Secondary | ICD-10-CM | POA: Diagnosis not present

## 2013-08-14 DIAGNOSIS — M899 Disorder of bone, unspecified: Secondary | ICD-10-CM | POA: Diagnosis not present

## 2013-08-14 DIAGNOSIS — M949 Disorder of cartilage, unspecified: Secondary | ICD-10-CM | POA: Diagnosis not present

## 2013-08-14 LAB — POCT URINALYSIS DIPSTICK
BILIRUBIN UA: NEGATIVE
GLUCOSE UA: NEGATIVE
KETONES UA: NEGATIVE
Nitrite, UA: NEGATIVE
SPEC GRAV UA: 1.025
UROBILINOGEN UA: NEGATIVE
pH, UA: 6.5

## 2013-08-14 LAB — POCT UA - MICROSCOPIC ONLY
BACTERIA, U MICROSCOPIC: NEGATIVE
Casts, Ur, LPF, POC: NEGATIVE
Crystals, Ur, HPF, POC: NEGATIVE
Yeast, UA: NEGATIVE

## 2013-08-14 NOTE — Progress Notes (Signed)
Pt dropped off urine sample that was ordered on the 1st of this month

## 2013-08-14 NOTE — Addendum Note (Signed)
Addended by: Earlene Plater on: 08/14/2013 11:51 AM   Modules accepted: Orders

## 2013-08-15 ENCOUNTER — Other Ambulatory Visit: Payer: Medicare Other

## 2013-08-15 DIAGNOSIS — Z1212 Encounter for screening for malignant neoplasm of rectum: Secondary | ICD-10-CM | POA: Diagnosis not present

## 2013-08-15 DIAGNOSIS — R195 Other fecal abnormalities: Secondary | ICD-10-CM

## 2013-08-15 NOTE — Progress Notes (Signed)
Pt came in for labs only 

## 2013-08-15 NOTE — Telephone Encounter (Signed)
Left message Rx was called into [facility]

## 2013-08-17 LAB — URINE CULTURE

## 2013-08-17 LAB — FECAL OCCULT BLOOD, IMMUNOCHEMICAL: Fecal Occult Bld: POSITIVE — AB

## 2013-08-18 ENCOUNTER — Other Ambulatory Visit (INDEPENDENT_AMBULATORY_CARE_PROVIDER_SITE_OTHER): Payer: Medicare Other

## 2013-08-18 DIAGNOSIS — R195 Other fecal abnormalities: Secondary | ICD-10-CM

## 2013-08-18 LAB — POCT CBC
GRANULOCYTE PERCENT: 56.3 % (ref 37–80)
HCT, POC: 39.4 % (ref 37.7–47.9)
Hemoglobin: 12.3 g/dL (ref 12.2–16.2)
LYMPH, POC: 2.5 (ref 0.6–3.4)
MCH: 28.5 pg (ref 27–31.2)
MCHC: 31.2 g/dL — AB (ref 31.8–35.4)
MCV: 91.3 fL (ref 80–97)
MPV: 8 fL (ref 0–99.8)
PLATELET COUNT, POC: 256 10*3/uL (ref 142–424)
POC Granulocyte: 3.6 (ref 2–6.9)
POC LYMPH %: 39.3 % (ref 10–50)
RBC: 4.3 M/uL (ref 4.04–5.48)
RDW, POC: 12.8 %
WBC: 6.4 10*3/uL (ref 4.6–10.2)

## 2013-08-18 NOTE — Addendum Note (Signed)
Addended by: Zannie Cove on: 08/18/2013 08:51 AM   Modules accepted: Orders

## 2013-08-27 ENCOUNTER — Ambulatory Visit: Payer: Medicare Other | Admitting: Family

## 2013-08-27 ENCOUNTER — Encounter: Payer: Self-pay | Admitting: Physician Assistant

## 2013-08-27 ENCOUNTER — Telehealth: Payer: Self-pay | Admitting: *Deleted

## 2013-08-27 ENCOUNTER — Ambulatory Visit (INDEPENDENT_AMBULATORY_CARE_PROVIDER_SITE_OTHER): Payer: Medicare Other | Admitting: Physician Assistant

## 2013-08-27 VITALS — BP 124/62 | HR 66 | Temp 98.1°F | Ht 66.0 in | Wt 169.0 lb

## 2013-08-27 DIAGNOSIS — K5732 Diverticulitis of large intestine without perforation or abscess without bleeding: Secondary | ICD-10-CM

## 2013-08-27 MED ORDER — DOXYCYCLINE HYCLATE 100 MG PO TABS: 100.0000 mg | ORAL_TABLET | Freq: Two times a day (BID) | ORAL | Status: DC

## 2013-08-27 MED ORDER — METRONIDAZOLE 500 MG PO TABS: 500.0000 mg | ORAL_TABLET | Freq: Two times a day (BID) | ORAL | Status: DC

## 2013-08-27 NOTE — Progress Notes (Signed)
Subjective:     Patient ID: Carol [name], female   DOB: 11/06/1945, 67 y.o.   MRN: [id number]  HPI Pt with a hx of diverticulitis Now with LLQ abd pain Just recently seen by [facility] for same She has had hemoccult positive x 2 She has been referred back to her GI and just completed course of Cipro which she did not tolerate well  Review of Systems  Constitutional: Negative.   HENT: Negative.   Respiratory: Negative.   Cardiovascular: Negative.   Gastrointestinal: Positive for abdominal pain and blood in stool. Negative for nausea, vomiting, diarrhea, constipation, abdominal distention, anal bleeding and rectal pain.  Genitourinary: Negative.        Objective:   Physical Exam  Nursing note and vitals reviewed. Constitutional: She appears well-developed and well-nourished.  HENT:  Mouth/Throat: Oropharynx is clear and moist.  Neck: Neck supple.  Cardiovascular: Normal rate, regular rhythm and normal heart sounds.   Pulmonary/Chest: Effort normal and breath sounds normal.  Abdominal: Soft. Bowel sounds are normal. She exhibits no distension and no mass. There is tenderness. There is no rebound and no guarding.  Lymphadenopathy:    She has no cervical adenopathy.       Assessment:     LLQ abd pain- ? diverticulitis    Plan:     Keep f/u appt with GI in 2 weeks Pt unable to tolerate Cipro and has Sulfa allergy Doxyxcycline 100mg  and Flagyl 500mg  bid x 10 days Fluids Stay away from caff F/U prn

## 2013-08-27 NOTE — Telephone Encounter (Signed)
Pt meds sent to [facility] for today by Osa Craver. Pt wants meds called to [place], [place]. Doxy and Flagyl called to [facility] and I called [facility] and canceled Rx for Flagl and Doxy. I spoke with Marjoria R at extension [phone] and she canceled Rx. Pt is aware that Rx called to [facility].

## 2013-08-27 NOTE — Patient Instructions (Signed)

## 2013-08-28 ENCOUNTER — Encounter: Payer: Self-pay | Admitting: Family

## 2013-08-28 ENCOUNTER — Ambulatory Visit (INDEPENDENT_AMBULATORY_CARE_PROVIDER_SITE_OTHER): Payer: Medicare Other | Admitting: Family

## 2013-08-28 VITALS — BP 138/55 | HR 64 | Temp 98.1°F | Ht 66.0 in | Wt 168.6 lb

## 2013-08-28 DIAGNOSIS — Z01419 Encounter for gynecological examination (general) (routine) without abnormal findings: Secondary | ICD-10-CM

## 2013-08-28 NOTE — Addendum Note (Signed)
Addended by: Pollyann Kennedy F on: 08/28/2013 10:49 AM   Modules accepted: Orders

## 2013-08-28 NOTE — Patient Instructions (Signed)

## 2013-08-28 NOTE — Progress Notes (Signed)
   Subjective:    Patient ID: Carol [name], female    DOB: [birth year], 68 y.o.   MRN: [id number]  HPI Pt presents to the office for physical & pap only. Pt regularly see's Dr. Laurance Flatten who follows her for her chronic diseases. Pt does not have any concerns at this time. States her diverticulosis is doing the best it has has in days.  *I reviewed pt's chart for her last visit with Dr. Laurance Flatten on 07/28/13.  Review of Systems  Constitutional: Negative.   HENT: Negative.   Eyes: Negative.   Respiratory: Negative.  Negative for shortness of breath.   Cardiovascular: Negative.  Negative for palpitations.  Gastrointestinal: Negative.   Endocrine: Negative.   Genitourinary: Negative.   Musculoskeletal: Negative.   Neurological: Negative.   Hematological: Negative.   Psychiatric/Behavioral: Negative.   All other systems reviewed and are negative.      Objective:   Physical Exam  Vitals reviewed. Constitutional: She is oriented to person, place, and time. She appears well-developed and well-nourished. No distress.  HENT:  Head: Normocephalic and atraumatic.  Right Ear: External ear normal.  Mouth/Throat: Oropharynx is clear and moist.  Eyes: Pupils are equal, round, and reactive to light.  Neck: Normal range of motion. Neck supple. No thyromegaly present.  Cardiovascular: Normal rate, regular rhythm, normal heart sounds and intact distal pulses.   No murmur heard. Pulmonary/Chest: Effort normal and breath sounds normal. No respiratory distress. She has no wheezes. Right breast exhibits no inverted nipple, no mass, no nipple discharge, no skin change and no tenderness. Left breast exhibits no inverted nipple, no mass, no nipple discharge, no skin change and no tenderness. Breasts are symmetrical.  Abdominal: Soft. Bowel sounds are normal. She exhibits no distension. There is tenderness (tenderness in llq). There is guarding.  Genitourinary: Vagina normal. No vaginal discharge found.    Bi-manual- Ovaries nonpalpable   Musculoskeletal: Normal range of motion. She exhibits no edema and no tenderness.  Neurological: She is alert and oriented to person, place, and time. She has normal reflexes. No cranial nerve deficit.  Skin: Skin is warm and dry.  Psychiatric: She has a normal mood and affect. Her behavior is normal. Judgment and thought content normal.    BP 138/55  Pulse 64  Temp(Src) 98.1 F (36.7 C) (Oral)  Ht 5\' 6"  (1.676 m)  Wt 168 lb 9.6 oz (76.476 kg)  BMI 27.23 kg/m2       Assessment & Plan:  1. Encounter for routine gynecological examination - POCT urinalysis dipstick - POCT UA - Microscopic Only   Continue all meds Labs pending Health Maintenance reviewed Diet and exercise encouraged RTO as needed- Keep follow-up appointments with Dr. Ethlyn Daniels, FNP

## 2013-09-01 LAB — PAP IG W/ RFLX HPV ASCU: PAP Smear Comment: 0

## 2013-09-09 ENCOUNTER — Other Ambulatory Visit (INDEPENDENT_AMBULATORY_CARE_PROVIDER_SITE_OTHER): Payer: Medicare Other

## 2013-09-09 DIAGNOSIS — N390 Urinary tract infection, site not specified: Secondary | ICD-10-CM | POA: Diagnosis not present

## 2013-09-09 LAB — POCT UA - MICROSCOPIC ONLY
BACTERIA, U MICROSCOPIC: NEGATIVE
CRYSTALS, UR, HPF, POC: NEGATIVE
Casts, Ur, LPF, POC: NEGATIVE
Mucus, UA: NEGATIVE
RBC, URINE, MICROSCOPIC: NEGATIVE
Yeast, UA: NEGATIVE

## 2013-09-09 LAB — POCT URINALYSIS DIPSTICK
BILIRUBIN UA: NEGATIVE
Blood, UA: NEGATIVE
GLUCOSE UA: NEGATIVE
Ketones, UA: NEGATIVE
LEUKOCYTES UA: NEGATIVE
NITRITE UA: NEGATIVE
Protein, UA: NEGATIVE
Spec Grav, UA: 1.01
UROBILINOGEN UA: NEGATIVE
pH, UA: 6

## 2013-09-09 NOTE — Progress Notes (Signed)
Pt came in for lab  only 

## 2013-09-11 ENCOUNTER — Telehealth: Payer: Self-pay | Admitting: *Deleted

## 2013-09-11 NOTE — Telephone Encounter (Signed)
Message copied by Priscille Heidelberg on Thu Sep 11, 2013 11:21 AM ------      Message from: Chipper Herb      Created: Tue Sep 09, 2013  9:46 AM       The urinalysis is clear of infection or red blood cells ------

## 2013-09-12 NOTE — Telephone Encounter (Signed)
Patient aware.

## 2013-09-16 ENCOUNTER — Encounter: Payer: Self-pay | Admitting: Family Medicine

## 2013-09-16 DIAGNOSIS — R195 Other fecal abnormalities: Secondary | ICD-10-CM | POA: Diagnosis not present

## 2013-09-16 DIAGNOSIS — K5732 Diverticulitis of large intestine without perforation or abscess without bleeding: Secondary | ICD-10-CM | POA: Diagnosis not present

## 2013-09-19 DIAGNOSIS — M47817 Spondylosis without myelopathy or radiculopathy, lumbosacral region: Secondary | ICD-10-CM | POA: Diagnosis not present

## 2013-09-19 DIAGNOSIS — S335XXA Sprain of ligaments of lumbar spine, initial encounter: Secondary | ICD-10-CM | POA: Diagnosis not present

## 2013-09-19 DIAGNOSIS — M999 Biomechanical lesion, unspecified: Secondary | ICD-10-CM | POA: Diagnosis not present

## 2013-09-23 DIAGNOSIS — S335XXA Sprain of ligaments of lumbar spine, initial encounter: Secondary | ICD-10-CM | POA: Diagnosis not present

## 2013-09-23 DIAGNOSIS — R195 Other fecal abnormalities: Secondary | ICD-10-CM | POA: Diagnosis not present

## 2013-09-23 DIAGNOSIS — M47817 Spondylosis without myelopathy or radiculopathy, lumbosacral region: Secondary | ICD-10-CM | POA: Diagnosis not present

## 2013-09-23 DIAGNOSIS — M999 Biomechanical lesion, unspecified: Secondary | ICD-10-CM | POA: Diagnosis not present

## 2013-09-29 DIAGNOSIS — S335XXA Sprain of ligaments of lumbar spine, initial encounter: Secondary | ICD-10-CM | POA: Diagnosis not present

## 2013-09-29 DIAGNOSIS — M999 Biomechanical lesion, unspecified: Secondary | ICD-10-CM | POA: Diagnosis not present

## 2013-09-29 DIAGNOSIS — M47817 Spondylosis without myelopathy or radiculopathy, lumbosacral region: Secondary | ICD-10-CM | POA: Diagnosis not present

## 2013-10-13 DIAGNOSIS — M999 Biomechanical lesion, unspecified: Secondary | ICD-10-CM | POA: Diagnosis not present

## 2013-10-13 DIAGNOSIS — S335XXA Sprain of ligaments of lumbar spine, initial encounter: Secondary | ICD-10-CM | POA: Diagnosis not present

## 2013-10-13 DIAGNOSIS — M47817 Spondylosis without myelopathy or radiculopathy, lumbosacral region: Secondary | ICD-10-CM | POA: Diagnosis not present

## 2013-10-15 DIAGNOSIS — K319 Disease of stomach and duodenum, unspecified: Secondary | ICD-10-CM | POA: Diagnosis not present

## 2013-10-15 DIAGNOSIS — R195 Other fecal abnormalities: Secondary | ICD-10-CM | POA: Diagnosis not present

## 2013-10-15 DIAGNOSIS — K269 Duodenal ulcer, unspecified as acute or chronic, without hemorrhage or perforation: Secondary | ICD-10-CM | POA: Diagnosis not present

## 2013-10-29 ENCOUNTER — Ambulatory Visit: Payer: Medicare Other | Admitting: Family Medicine

## 2013-11-05 DIAGNOSIS — S335XXA Sprain of ligaments of lumbar spine, initial encounter: Secondary | ICD-10-CM | POA: Diagnosis not present

## 2013-11-05 DIAGNOSIS — M999 Biomechanical lesion, unspecified: Secondary | ICD-10-CM | POA: Diagnosis not present

## 2013-11-05 DIAGNOSIS — M47817 Spondylosis without myelopathy or radiculopathy, lumbosacral region: Secondary | ICD-10-CM | POA: Diagnosis not present

## 2013-11-14 ENCOUNTER — Ambulatory Visit (INDEPENDENT_AMBULATORY_CARE_PROVIDER_SITE_OTHER): Payer: Medicare Other

## 2013-11-14 ENCOUNTER — Encounter: Payer: Self-pay | Admitting: Family Medicine

## 2013-11-14 ENCOUNTER — Ambulatory Visit (INDEPENDENT_AMBULATORY_CARE_PROVIDER_SITE_OTHER): Payer: Medicare Other | Admitting: Family Medicine

## 2013-11-14 VITALS — BP 139/65 | HR 51 | Temp 97.1°F | Ht 66.0 in | Wt 172.6 lb

## 2013-11-14 DIAGNOSIS — M79641 Pain in right hand: Secondary | ICD-10-CM

## 2013-11-14 DIAGNOSIS — M79609 Pain in unspecified limb: Secondary | ICD-10-CM

## 2013-11-14 NOTE — Progress Notes (Signed)
   Subjective:    Patient ID: Carol [name], female    DOB: [birth year], 68 y.o.   MRN: [id number]  HPI This 68 y.o. female presents for evaluation of right hand pain after falling a week ago.   Review of Systems C/o right hand discomfort No chest pain, SOB, HA, dizziness, vision change, N/V, diarrhea, constipation, dysuria, urinary urgency or frequency or rash.     Objective:   Physical Exam Vital signs noted  Well developed well nourished female.  HEENT - Head atraumatic Normocephalic Respiratory - Lungs CTA bilateral Cardiac - RRR S1 and S2 without murmur MS - TTP right lateral hand  Xray right hand - no fx     Assessment & Plan:  Hand pain, right - Plan: DG Hand Complete Right Motrin otc as directed for a week.  Lysbeth Penner FNP

## 2013-11-26 ENCOUNTER — Ambulatory Visit (INDEPENDENT_AMBULATORY_CARE_PROVIDER_SITE_OTHER): Payer: Medicare Other

## 2013-11-26 DIAGNOSIS — Z23 Encounter for immunization: Secondary | ICD-10-CM | POA: Diagnosis not present

## 2013-12-01 ENCOUNTER — Other Ambulatory Visit: Payer: Self-pay | Admitting: Family Medicine

## 2013-12-01 DIAGNOSIS — Z1231 Encounter for screening mammogram for malignant neoplasm of breast: Secondary | ICD-10-CM

## 2013-12-11 ENCOUNTER — Ambulatory Visit (HOSPITAL_COMMUNITY)
Admission: RE | Admit: 2013-12-11 | Discharge: 2013-12-11 | Disposition: A | Discharge status: Home / Self Care | Payer: Medicare Other | Source: Ambulatory Visit | Attending: Family Medicine | Admitting: Family Medicine

## 2013-12-11 DIAGNOSIS — Z1231 Encounter for screening mammogram for malignant neoplasm of breast: Secondary | ICD-10-CM

## 2014-01-19 ENCOUNTER — Ambulatory Visit (INDEPENDENT_AMBULATORY_CARE_PROVIDER_SITE_OTHER): Payer: Medicare Other | Admitting: Family Medicine

## 2014-01-19 ENCOUNTER — Encounter: Payer: Self-pay | Admitting: Family Medicine

## 2014-01-19 VITALS — BP 135/69 | HR 68 | Temp 98.5°F | Ht 66.0 in | Wt 174.6 lb

## 2014-01-19 DIAGNOSIS — J0101 Acute recurrent maxillary sinusitis: Secondary | ICD-10-CM

## 2014-01-19 MED ORDER — DOXYCYCLINE HYCLATE 100 MG PO TABS: 100.0000 mg | ORAL_TABLET | Freq: Two times a day (BID) | ORAL | Status: DC

## 2014-01-19 NOTE — Progress Notes (Signed)
   Subjective:    Patient ID: Carol [name], female    DOB: 1945/06/27, 68 y.o.   MRN: [id number]  HPI 1-year-old female with some head congestion postnasal drainage which is colored. She has a history of sinusitis, about once a year per history. She denies fever. She does have a cough that is somewhat productive    Review of Systems  Constitutional: Negative.   HENT: Positive for congestion and sinus pressure.   Eyes: Negative.   Respiratory: Positive for cough.   Cardiovascular: Negative.   Gastrointestinal: Negative.   Endocrine: Negative.   Genitourinary: Negative.   Musculoskeletal: Positive for myalgias.       Bilateral thumb pain  Skin: Rash: not true rash but healing intertrigo.  Hematological: Negative.   Psychiatric/Behavioral: Negative.        Objective:   Physical Exam  Constitutional: She is oriented to person, place, and time. She appears well-developed and well-nourished.  Eyes: Conjunctivae and EOM are normal.  Neck: Normal range of motion. Neck supple.  Cardiovascular: Normal rate, regular rhythm and normal heart sounds.   Pulmonary/Chest: Effort normal and breath sounds normal.  Abdominal: Soft. Bowel sounds are normal.  Musculoskeletal: Normal range of motion.  Neurological: She is alert and oriented to person, place, and time. She has normal reflexes.  Skin: Skin is warm and dry.  Psychiatric: She has a normal mood and affect. Her behavior is normal. Thought content normal.    BP 135/69 mmHg  Pulse 68  Temp(Src) 98.5 F (36.9 C) (Oral)  Ht 5\' 6"  (1.676 m)  Wt 174 lb 9.6 oz (79.198 kg)  BMI 28.19 kg/m2      Assessment & Plan:  1. Acute recurrent maxillary sinusitis Since she is allergic to penicillin and sulfa we will use doxycycline 100 mg twice a day area also recommend increase hydration, hot showers, and OTC Mucinex  Wardell Honour MD

## 2014-01-19 NOTE — Patient Instructions (Signed)

## 2014-01-26 ENCOUNTER — Other Ambulatory Visit: Payer: Self-pay

## 2014-01-26 DIAGNOSIS — E785 Hyperlipidemia, unspecified: Secondary | ICD-10-CM

## 2014-01-26 MED ORDER — FENOFIBRATE 145 MG PO TABS: 145.0000 mg | ORAL_TABLET | Freq: Every day | ORAL | Status: DC

## 2014-01-26 MED ORDER — EZETIMIBE-SIMVASTATIN 10-20 MG PO TABS: 1.0000 | ORAL_TABLET | Freq: Every day | ORAL | Status: DC

## 2014-01-27 ENCOUNTER — Ambulatory Visit (INDEPENDENT_AMBULATORY_CARE_PROVIDER_SITE_OTHER): Payer: Medicare Other | Admitting: Family Medicine

## 2014-01-27 ENCOUNTER — Encounter: Payer: Self-pay | Admitting: Family Medicine

## 2014-01-27 VITALS — BP 124/82 | HR 72 | Temp 98.1°F | Ht 66.0 in | Wt 175.0 lb

## 2014-01-27 DIAGNOSIS — E785 Hyperlipidemia, unspecified: Secondary | ICD-10-CM | POA: Diagnosis not present

## 2014-01-27 DIAGNOSIS — Z0000 Encounter for general adult medical examination without abnormal findings: Secondary | ICD-10-CM

## 2014-01-27 DIAGNOSIS — E559 Vitamin D deficiency, unspecified: Secondary | ICD-10-CM

## 2014-01-27 DIAGNOSIS — J0120 Acute ethmoidal sinusitis, unspecified: Secondary | ICD-10-CM | POA: Diagnosis not present

## 2014-01-27 DIAGNOSIS — M8580 Other specified disorders of bone density and structure, unspecified site: Secondary | ICD-10-CM | POA: Diagnosis not present

## 2014-01-27 DIAGNOSIS — M899 Disorder of bone, unspecified: Secondary | ICD-10-CM | POA: Diagnosis not present

## 2014-01-27 LAB — POCT CBC
Granulocyte percent: 65.1 %G (ref 37–80)
HCT, POC: 41.5 % (ref 37.7–47.9)
Hemoglobin: 14 g/dL (ref 12.2–16.2)
Lymph, poc: 2.5 (ref 0.6–3.4)
MCH: 30.4 pg (ref 27–31.2)
MCHC: 33.7 g/dL (ref 31.8–35.4)
MCV: 90.3 fL (ref 80–97)
MPV: 7.6 fL (ref 0–99.8)
PLATELET COUNT, POC: 265 10*3/uL (ref 142–424)
POC Granulocyte: 4.9 (ref 2–6.9)
POC LYMPH PERCENT: 33.3 %L (ref 10–50)
RBC: 4.6 M/uL (ref 4.04–5.48)
RDW, POC: 13.3 %
WBC: 7.5 10*3/uL (ref 4.6–10.2)

## 2014-01-27 NOTE — Progress Notes (Signed)
Subjective:    Patient ID: Carol [name], female    DOB: Aug 20, 1945, 68 y.o.   MRN: [id number]  HPI Pt here for follow up and management of chronic medical problems. The patient has no specific complaints today. She is up-to-date on her health maintenance issues. She will get lab work done today. No refills are requested. She also informed me that her sister who is 74 years older has been diagnosed with bone cancer stage IV. She is completing her course of doxycycline and is feeling better with her sinus problems.        Patient Active Problem List   Diagnosis Date Noted  . Family history of heart disease 07/28/2013  . Need for prophylactic vaccination and inoculation against influenza 11/26/2012  . Diverticulosis   . Osteopenia 09/11/2012  . HLD (hyperlipidemia) 07/23/2012  . Diverticulosis of colon 01/02/2012  . H/O infectious disease 01/02/2012  . Pseudoptosis 11/07/2011   Outpatient Encounter Prescriptions as of 01/27/2014  Medication Sig  . aspirin EC 81 MG tablet 81 mg. 81 mg nightly.  . Calcium 500-125 MG-UNIT TABS 1 tablet. 1 tablet daily.  . Cholecalciferol (PA VITAMIN D-3) 2000 UNITS CAPS 1 capsule. 1 capsule daily.  Marland Kitchen doxycycline (VIBRA-TABS) 100 MG tablet Take 1 tablet (100 mg total) by mouth 2 (two) times daily.  Marland Kitchen ezetimibe-simvastatin (VYTORIN) 10-20 MG per tablet Take 1 tablet by mouth at bedtime.  . fenofibrate (TRICOR) 145 MG tablet Take 1 tablet (145 mg total) by mouth daily.  . Multiple Vitamins tablet 1 tablet. 1 tablet daily.  . OMEGA-3 1000 MG CAPS 2 g 2 times daily.  . [DISCONTINUED] Fenofibrate (TRICOR PO) Take 160 mg by mouth daily.    Review of Systems  Constitutional: Negative.   HENT: Negative.   Eyes: Negative.   Respiratory: Negative.   Cardiovascular: Negative.   Gastrointestinal: Negative.   Endocrine: Negative.   Genitourinary: Negative.   Musculoskeletal: Negative.   Skin: Negative.   Allergic/Immunologic: Negative.   Neurological:  Negative.   Hematological: Negative.   Psychiatric/Behavioral: Negative.        Objective:   Physical Exam  Constitutional: She is oriented to person, place, and time. She appears well-developed and well-nourished. No distress.  HENT:  Head: Normocephalic and atraumatic.  Right Ear: External ear normal.  Left Ear: External ear normal.  Mouth/Throat: Oropharynx is clear and moist.  Is nasal congestion bilaterally right greater than left  Eyes: Conjunctivae and EOM are normal. Pupils are equal, round, and reactive to light. Right eye exhibits no discharge. Left eye exhibits no discharge. No scleral icterus.  Neck: Normal range of motion. Neck supple. No thyromegaly present.  No anterior cervical adenopathy or thyromegaly  Cardiovascular: Normal rate, regular rhythm and normal heart sounds.   No murmur heard. At 72/m  Pulmonary/Chest: Effort normal and breath sounds normal. No respiratory distress. She has no wheezes. She has no rales. She exhibits no tenderness.  Abdominal: Soft. Bowel sounds are normal. She exhibits no mass. There is no tenderness. There is no rebound and no guarding.  Musculoskeletal: Normal range of motion. She exhibits no edema.  Lymphadenopathy:    She has no cervical adenopathy.  Neurological: She is alert and oriented to person, place, and time.  Skin: Skin is warm and dry. No rash noted.  Psychiatric: She has a normal mood and affect. Her behavior is normal. Judgment and thought content normal.  Nursing note and vitals reviewed.  BP 124/82 mmHg  Pulse 72  Temp(Src)  98.1 F (36.7 C) (Oral)  Ht $R'5\' 6"'WK$  (1.676 m)  Wt 175 lb (79.379 kg)  BMI 28.26 kg/m2        Assessment & Plan:  1. HLD (hyperlipidemia) - POCT CBC - BMP8+EGFR - Hepatic function panel - NMR, lipoprofile  2. Health care maintenance - POCT CBC  3. Osteopenia - POCT CBC - Vit D  25 hydroxy (rtn osteoporosis monitoring)  4. Vitamin D deficiency - Vit D  25 hydroxy (rtn  osteoporosis monitoring)  5. Acute ethmoidal sinusitis, recurrence not specified  No orders of the defined types were placed in this encounter.   Patient Instructions  Finish antibiotic Please reconsider getting the Pneumovax and Prevnar Continue to be careful and do not put herself at risk for falling This winter, use saline nose spray and Mucinex for head congestion and cough as needed Drink plenty of fluids Keep house as cool as possible Use a cool mist humidifier if available Continue to take current medications We will call you with her lab work results once those results are available   Arrie Senate MD

## 2014-01-27 NOTE — Addendum Note (Signed)
Addended by: Earlene Plater on: 01/27/2014 09:35 AM   Modules accepted: Miquel Dunn

## 2014-01-27 NOTE — Patient Instructions (Signed)
Finish antibiotic Please reconsider getting the Pneumovax and Prevnar Continue to be careful and do not put herself at risk for falling This winter, use saline nose spray and Mucinex for head congestion and cough as needed Drink plenty of fluids Keep house as cool as possible Use a cool mist humidifier if available Continue to take current medications We will call you with her lab work results once those results are available

## 2014-01-28 ENCOUNTER — Telehealth: Payer: Self-pay | Admitting: Family Medicine

## 2014-01-28 LAB — NMR, LIPOPROFILE
Cholesterol: 190 mg/dL (ref 100–199)
HDL Cholesterol by NMR: 68 mg/dL (ref >39)
HDL Particle Number: 48.8 umol/L (ref >=30.5)
LDL PARTICLE NUMBER: 1224 nmol/L — AB (ref <1000)
LDL Size: 21.4 nm (ref >20.5)
LDL-C: 102 mg/dL — ABNORMAL HIGH (ref 0–99)
LP-IR Score: 47 — ABNORMAL HIGH (ref <=45)
Small LDL Particle Number: 479 nmol/L (ref <=527)
TRIGLYCERIDES BY NMR: 98 mg/dL (ref 0–149)

## 2014-01-28 LAB — BMP8+EGFR
BUN/Creatinine Ratio: 17 (ref 11–26)
BUN: 16 mg/dL (ref 8–27)
CALCIUM: 9.5 mg/dL (ref 8.7–10.3)
CO2: 26 mmol/L (ref 18–29)
Chloride: 105 mmol/L (ref 97–108)
Creatinine, Ser: 0.94 mg/dL (ref 0.57–1.00)
GFR calc Af Amer: 71 mL/min/{1.73_m2} (ref >59)
GFR, EST NON AFRICAN AMERICAN: 62 mL/min/{1.73_m2} (ref >59)
Glucose: 88 mg/dL (ref 65–99)
POTASSIUM: 4.8 mmol/L (ref 3.5–5.2)
SODIUM: 144 mmol/L (ref 134–144)

## 2014-01-28 LAB — HEPATIC FUNCTION PANEL
ALT: 19 IU/L (ref 0–32)
AST: 22 IU/L (ref 0–40)
Albumin: 4.3 g/dL (ref 3.5–4.8)
Alkaline Phosphatase: 51 IU/L (ref 39–117)
BILIRUBIN DIRECT: 0.13 mg/dL (ref 0.00–0.40)
Total Bilirubin: 0.4 mg/dL (ref 0.0–1.2)
Total Protein: 7.1 g/dL (ref 6.0–8.5)

## 2014-01-28 LAB — VITAMIN D 25 HYDROXY (VIT D DEFICIENCY, FRACTURES): Vit D, 25-Hydroxy: 24 ng/mL — ABNORMAL LOW (ref 30.0–100.0)

## 2014-01-28 NOTE — Telephone Encounter (Signed)
-----   Message from Chipper Herb, MD sent at 01/28/2014 11:02 AM EST ----- The blood sugar is good at 88. The creatinine the most important kidney function test is within normal limits. The electrolytes including potassium are within normal limits. Liver function tests are within normal limits Cholesterol numbers with advanced lipid testing have an LDL particle number that is more elevated than it was 6 months ago. Is now 1224. The LDL C is slightly elevated at 102. The triglycerides are good at 98. The good cholesterol or the HDL particle number is excellent.------ the patient should continue with her Vytorin and she should try to do better with aggressive therapeutic lifestyle changes which include diet and exercise The vitamin D level is very low------ for now, discontinue vitamin D 2000 daily and start vitamin D 50,000 units #12 one weekly with 1 refill. Recheck vitamin D level in 3-4 months.

## 2014-01-29 MED ORDER — VITAMIN D (ERGOCALCIFEROL) 1.25 MG (50000 UNIT) PO CAPS: 50000.0000 [IU] | ORAL_CAPSULE | ORAL | Status: DC

## 2014-01-29 NOTE — Addendum Note (Signed)
Addended by: Zannie Cove on: 01/29/2014 12:32 PM   Modules accepted: Orders

## 2014-01-30 NOTE — Telephone Encounter (Signed)
Patient aware.

## 2014-02-27 DIAGNOSIS — L989 Disorder of the skin and subcutaneous tissue, unspecified: Secondary | ICD-10-CM

## 2014-02-27 HISTORY — DX: Disorder of the skin and subcutaneous tissue, unspecified: L98.9

## 2014-04-08 ENCOUNTER — Other Ambulatory Visit: Payer: Self-pay | Admitting: Family Medicine

## 2014-04-08 ENCOUNTER — Encounter: Payer: Self-pay | Admitting: *Deleted

## 2014-04-24 DIAGNOSIS — Z0000 Encounter for general adult medical examination without abnormal findings: Secondary | ICD-10-CM | POA: Diagnosis not present

## 2014-05-19 ENCOUNTER — Other Ambulatory Visit: Payer: Self-pay | Admitting: Family Medicine

## 2014-05-19 MED ORDER — EZETIMIBE-SIMVASTATIN 10-20 MG PO TABS: 1.0000 | ORAL_TABLET | Freq: Every day | ORAL | Status: DC

## 2014-05-19 NOTE — Telephone Encounter (Signed)
done

## 2014-05-26 ENCOUNTER — Other Ambulatory Visit: Payer: Self-pay | Admitting: *Deleted

## 2014-05-26 DIAGNOSIS — I6523 Occlusion and stenosis of bilateral carotid arteries: Secondary | ICD-10-CM

## 2014-05-27 ENCOUNTER — Other Ambulatory Visit: Payer: Self-pay

## 2014-05-27 DIAGNOSIS — I6523 Occlusion and stenosis of bilateral carotid arteries: Secondary | ICD-10-CM

## 2014-05-28 ENCOUNTER — Ambulatory Visit (INDEPENDENT_AMBULATORY_CARE_PROVIDER_SITE_OTHER): Payer: Medicare Other | Admitting: Family

## 2014-05-28 ENCOUNTER — Encounter: Payer: Self-pay | Admitting: Family

## 2014-05-28 VITALS — BP 144/56 | HR 52 | Temp 97.1°F | Ht 66.0 in | Wt 178.0 lb

## 2014-05-28 DIAGNOSIS — I6523 Occlusion and stenosis of bilateral carotid arteries: Secondary | ICD-10-CM

## 2014-05-28 DIAGNOSIS — S43401A Unspecified sprain of right shoulder joint, initial encounter: Secondary | ICD-10-CM | POA: Diagnosis not present

## 2014-05-28 MED ORDER — METHYLPREDNISOLONE (PAK) 4 MG PO TABS: ORAL_TABLET | ORAL | Status: DC

## 2014-05-28 MED ORDER — KETOROLAC TROMETHAMINE 30 MG/ML IJ SOLN
30.0000 mg | Freq: Once | INTRAMUSCULAR | Status: AC
  Administered 2014-05-28: 30 mg via INTRAMUSCULAR

## 2014-05-28 NOTE — Patient Instructions (Signed)
Shoulder Pain The shoulder is the joint that connects your arms to your body. The bones that form the shoulder joint include the upper arm bone (humerus), the shoulder blade (scapula), and the collarbone (clavicle). The top of the humerus is shaped like a ball and fits into a rather flat socket on the scapula (glenoid cavity). A combination of muscles and strong, fibrous tissues that connect muscles to bones (tendons) support your shoulder joint and hold the ball in the socket. Small, fluid-filled sacs (bursae) are located in different areas of the joint. They act as cushions between the bones and the overlying soft tissues and help reduce friction between the gliding tendons and the bone as you move your arm. Your shoulder joint allows a wide range of motion in your arm. This range of motion allows you to do things like scratch your back or throw a ball. However, this range of motion also makes your shoulder more prone to pain from overuse and injury. Causes of shoulder pain can originate from both injury and overuse and usually can be grouped in the following four categories:  Redness, swelling, and pain (inflammation) of the tendon (tendinitis) or the bursae (bursitis).  Instability, such as a dislocation of the joint.  Inflammation of the joint (arthritis).  Broken bone (fracture). HOME CARE INSTRUCTIONS   Apply ice to the sore area.  Put ice in a plastic bag.  Place a towel between your skin and the bag.  Leave the ice on for 15-20 minutes, 3-4 times per day for the first 2 days, or as directed by your health care provider.  Stop using cold packs if they do not help with the pain.  If you have a shoulder sling or immobilizer, wear it as long as your caregiver instructs. Only remove it to shower or bathe. Move your arm as little as possible, but keep your hand moving to prevent swelling.  Squeeze a soft ball or foam pad as much as possible to help prevent swelling.  Only take  over-the-counter or prescription medicines for pain, discomfort, or fever as directed by your caregiver. SEEK MEDICAL CARE IF:   Your shoulder pain increases, or new pain develops in your arm, hand, or fingers.  Your hand or fingers become cold and numb.  Your pain is not relieved with medicines. SEEK IMMEDIATE MEDICAL CARE IF:   Your arm, hand, or fingers are numb or tingling.  Your arm, hand, or fingers are significantly swollen or turn white or blue. MAKE SURE YOU:   Understand these instructions.  Will watch your condition.  Will get help right away if you are not doing well or get worse. Document Released: 11/23/2004 Document Revised: 06/30/2013 Document Reviewed: 01/28/2011 [facility] Patient Information 2015 [place], [place]. This information is not intended to replace advice given to you by your health care provider. Make sure you discuss any questions you have with your health care provider. Shoulder Sprain A shoulder sprain is the result of damage to the tough, fiber-like tissues (ligaments) that help hold your shoulder in place. The ligaments may be stretched or torn. Besides the main shoulder joint (the ball and socket), there are several smaller joints that connect the bones in this area. A sprain usually involves one of those joints. Most often it is the acromioclavicular (or AC) joint. That is the joint that connects the collarbone (clavicle) and the shoulder blade (scapula) at the top point of the shoulder blade (acromion). A shoulder sprain is a mild form of what  is called a shoulder separation. Recovering from a shoulder sprain may take some time. For some, pain lingers for several months. Most people recover without long term problems. CAUSES   A shoulder sprain is usually caused by some kind of trauma. This might be:  Falling on an outstretched arm.  Being hit hard on the shoulder.  Twisting the arm.  Shoulder sprains are more likely to occur in people who:  Play  sports.  Have balance or coordination problems. SYMPTOMS   Pain when you move your shoulder.  Limited ability to move the shoulder.  Swelling and tenderness on top of the shoulder.  Redness or warmth in the shoulder.  Bruising.  A change in the shape of the shoulder. DIAGNOSIS  Your healthcare provider may:  Ask about your symptoms.  Ask about recent activity that might have caused those symptoms.  Examine your shoulder. You may be asked to do simple exercises to test movement. The other shoulder will be examined for comparison.  Order some tests that provide a look inside the body. They can show the extent of the injury. The tests could include:  X-rays.  CT (computed tomography) scan.  MRI (magnetic resonance imaging) scan. RISKS AND COMPLICATIONS  Loss of full shoulder motion.  Ongoing shoulder pain. TREATMENT  How long it takes to recover from a shoulder sprain depends on how severe it was. Treatment options may include:  Rest. You should not use the arm or shoulder until it heals.  Ice. For 2 or 3 days after the injury, put an ice pack on the shoulder up to 4 times a day. It should stay on for 15 to 20 minutes each time. Wrap the ice in a towel so it does not touch your skin.  Over-the-counter medicine to relieve pain.  A sling or brace. This will keep the arm still while the shoulder is healing.  Physical therapy or rehabilitation exercises. These will help you regain strength and motion. Ask your healthcare provider when it is OK to begin these exercises.  Surgery. The need for surgery is rare with a sprained shoulder, but some people may need surgery to keep the joint in place and reduce pain. HOME CARE INSTRUCTIONS   Ask your healthcare provider about what you should and should not do while your shoulder heals.  Make sure you know how to apply ice to the correct area of your shoulder.  Talk with your healthcare provider about which medications should  be used for pain and swelling.  If rehabilitation therapy will be needed, ask your healthcare provider to refer you to a therapist. If it is not recommended, then ask about at-home exercises. Find out when exercise should begin. SEEK MEDICAL CARE IF:  Your pain, swelling, or redness at the joint increases. SEEK IMMEDIATE MEDICAL CARE IF:   You have a fever.  You cannot move your arm or shoulder. Document Released: 07/02/2008 Document Revised: 05/08/2011 Document Reviewed: 07/02/2008 [facility] Patient Information 2015 [place], [place]. This information is not intended to replace advice given to you by your health care provider. Make sure you discuss any questions you have with your health care provider.

## 2014-05-28 NOTE — Progress Notes (Signed)
   Subjective:    Patient ID: Carol [name], female    DOB: 01-23-1946, 69 y.o.   MRN: [id number]  Shoulder Pain  The pain is present in the right shoulder. This is a new problem. The current episode started yesterday. There has been a history of trauma. The problem occurs constantly. The problem has been waxing and waning. The quality of the pain is described as aching. Associated symptoms include a limited range of motion. Pertinent negatives include no inability to bear weight, numbness or tingling. The symptoms are aggravated by activity. She has tried rest, heat and OTC ointments for the symptoms. The treatment provided mild relief. There is no history of diabetes or osteoarthritis.      Review of Systems  Constitutional: Negative.   HENT: Negative.   Eyes: Negative.   Respiratory: Negative.  Negative for shortness of breath.   Cardiovascular: Negative.  Negative for palpitations.  Gastrointestinal: Negative.   Endocrine: Negative.   Genitourinary: Negative.   Musculoskeletal: Negative.   Neurological: Negative.  Negative for tingling, numbness and headaches.  Hematological: Negative.   Psychiatric/Behavioral: Negative.   All other systems reviewed and are negative.      Objective:   Physical Exam  Constitutional: She is oriented to person, place, and time. She appears well-developed and well-nourished. No distress.  HENT:  Head: Normocephalic and atraumatic.  Right Ear: External ear normal.  Mouth/Throat: Oropharynx is clear and moist.  Eyes: Pupils are equal, round, and reactive to light.  Neck: Normal range of motion. Neck supple. No thyromegaly present.  Cardiovascular: Normal rate, regular rhythm, normal heart sounds and intact distal pulses.   No murmur heard. Pulmonary/Chest: Effort normal and breath sounds normal. No respiratory distress. She has no wheezes.  Abdominal: Soft. Bowel sounds are normal. She exhibits no distension. There is no tenderness.    Musculoskeletal: She exhibits no edema or tenderness.  Right shoulder pain with flexion, limited ROM r/t pain when lifting arm above head  Neurological: She is alert and oriented to person, place, and time. She has normal reflexes. No cranial nerve deficit.  Skin: Skin is warm and dry.  Psychiatric: She has a normal mood and affect. Her behavior is normal. Judgment and thought content normal.  Vitals reviewed.     BP 144/56 mmHg  Pulse 52  Temp(Src) 97.1 F (36.2 C) (Oral)  Ht $R'5\' 6"'Ir$  (1.676 m)  Wt 178 lb (80.74 kg)  BMI 28.74 kg/m2     Assessment & Plan:  1. Sprain of right shoulder, initial encounter -Rest Ice -Tylenol prn for pain - ketorolac (TORADOL) 30 MG/ML injection 30 mg; Inject 1 mL (30 mg total) into the muscle once. - BMP8+EGFR - methylPREDNIsolone (MEDROL DOSPACK) 4 MG tablet; follow package directions  Dispense: 21 tablet; Refill: 0  Evelina Dun, FNP

## 2014-05-29 ENCOUNTER — Ambulatory Visit (HOSPITAL_COMMUNITY)
Admission: RE | Admit: 2014-05-29 | Discharge: 2014-05-29 | Disposition: A | Discharge status: Home / Self Care | Payer: Medicare Other | Source: Ambulatory Visit | Attending: Family Medicine | Admitting: Family Medicine

## 2014-05-29 DIAGNOSIS — I6523 Occlusion and stenosis of bilateral carotid arteries: Secondary | ICD-10-CM | POA: Insufficient documentation

## 2014-06-03 ENCOUNTER — Other Ambulatory Visit: Payer: Self-pay | Admitting: *Deleted

## 2014-06-04 ENCOUNTER — Telehealth: Payer: Self-pay | Admitting: *Deleted

## 2014-06-12 ENCOUNTER — Telehealth: Payer: Self-pay | Admitting: Family Medicine

## 2014-06-16 ENCOUNTER — Encounter: Payer: Self-pay | Admitting: Family Medicine

## 2014-06-16 ENCOUNTER — Ambulatory Visit (INDEPENDENT_AMBULATORY_CARE_PROVIDER_SITE_OTHER): Payer: Medicare Other | Admitting: Family Medicine

## 2014-06-16 VITALS — BP 124/73 | HR 59 | Temp 99.2°F | Ht 66.0 in | Wt 175.0 lb

## 2014-06-16 DIAGNOSIS — R1013 Epigastric pain: Secondary | ICD-10-CM | POA: Diagnosis not present

## 2014-06-16 DIAGNOSIS — R195 Other fecal abnormalities: Secondary | ICD-10-CM | POA: Diagnosis not present

## 2014-06-16 DIAGNOSIS — I6523 Occlusion and stenosis of bilateral carotid arteries: Secondary | ICD-10-CM | POA: Diagnosis not present

## 2014-06-16 LAB — POCT CBC
Granulocyte percent: 53.5 %G (ref 37–80)
HCT, POC: 35.2 % — AB (ref 37.7–47.9)
HEMOGLOBIN: 11.4 g/dL — AB (ref 12.2–16.2)
LYMPH, POC: 2.8 (ref 0.6–3.4)
MCH, POC: 29.8 pg (ref 27–31.2)
MCHC: 32.5 g/dL (ref 31.8–35.4)
MCV: 91.6 fL (ref 80–97)
MPV: 7.3 fL (ref 0–99.8)
POC GRANULOCYTE: 3.9 (ref 2–6.9)
POC LYMPH %: 39.3 % (ref 10–50)
Platelet Count, POC: 261 10*3/uL (ref 142–424)
RBC: 0.384 M/uL — AB (ref 4.04–5.48)
RDW, POC: 13.1 %
WBC: 7.2 10*3/uL (ref 4.6–10.2)

## 2014-06-16 NOTE — Progress Notes (Signed)
Subjective:    Patient ID: Carol [name], female    DOB: 1946-02-06, 69 y.o.   MRN: [id number]  HPI Patient here today for right side abdominal pain and dark colored stools.        Patient Active Problem List   Diagnosis Date Noted  . Family history of heart disease 07/28/2013  . Need for prophylactic vaccination and inoculation against influenza 11/26/2012  . Diverticulosis   . Osteopenia 09/11/2012  . HLD (hyperlipidemia) 07/23/2012  . Diverticulosis of colon 01/02/2012  . H/O infectious disease 01/02/2012  . Pseudoptosis 11/07/2011   Outpatient Encounter Prescriptions as of 06/16/2014  Medication Sig  . Calcium 500-125 MG-UNIT TABS 1 tablet. 1 tablet daily.  . Cholecalciferol (PA VITAMIN D-3) 2000 UNITS CAPS 1 capsule. 1 capsule daily.  Marland Kitchen ezetimibe-simvastatin (VYTORIN) 10-20 MG per tablet Take 1 tablet by mouth at bedtime.  . fenofibrate (TRICOR) 145 MG tablet Take 1 tablet (145 mg total) by mouth daily.  . Multiple Vitamins tablet 1 tablet. 1 tablet daily.  . OMEGA-3 1000 MG CAPS 2 g 2 times daily.  . [DISCONTINUED] aspirin EC 81 MG tablet 81 mg. 81 mg nightly.  . [DISCONTINUED] methylPREDNIsolone (MEDROL DOSPACK) 4 MG tablet follow package directions    Review of Systems  Constitutional: Negative.   HENT: Negative.   Eyes: Negative.   Respiratory: Negative.   Cardiovascular: Negative.   Gastrointestinal: Positive for abdominal pain (right side).       Dark colored stools  Endocrine: Negative.   Genitourinary: Negative.   Musculoskeletal: Negative.   Skin: Negative.   Allergic/Immunologic: Negative.   Neurological: Negative.   Hematological: Negative.   Psychiatric/Behavioral: Negative.        Objective:   Physical Exam  Constitutional: She is oriented to person, place, and time. She appears well-developed and well-nourished. No distress.  HENT:  Head: Normocephalic and atraumatic.  Right Ear: External ear normal.  Left Ear: External ear normal.    Nose: Nose normal.  Mouth/Throat: Oropharynx is clear and moist. No oropharyngeal exudate.  Eyes: Conjunctivae and EOM are normal. Pupils are equal, round, and reactive to light. Right eye exhibits no discharge. Left eye exhibits no discharge. No scleral icterus.  Neck: Normal range of motion. Neck supple. No thyromegaly present.  No carotid bruits were audible  Cardiovascular: Normal rate, regular rhythm, normal heart sounds and intact distal pulses.   No murmur heard. At 72/m without murmur  Pulmonary/Chest: Effort normal and breath sounds normal. No respiratory distress. She has no wheezes. She has no rales. She exhibits no tenderness.  Abdominal: Soft. Bowel sounds are normal. She exhibits no mass. There is tenderness. There is no rebound and no guarding.  Epigastric tenderness  Musculoskeletal: Normal range of motion. She exhibits no edema or tenderness.  Lymphadenopathy:    She has no cervical adenopathy.  Neurological: She is alert and oriented to person, place, and time.  Skin: Skin is warm and dry. No rash noted.  Psychiatric: She has a normal mood and affect. Her behavior is normal. Judgment and thought content normal.  Nursing note and vitals reviewed.  BP 124/73 mmHg  Pulse 59  Temp(Src) 99.2 F (37.3 C) (Oral)  Ht 5\' 6"  (1.676 m)  Wt 175 lb (79.379 kg)  BMI 28.26 kg/m2        Assessment & Plan:  1. Dark stools -Avoid all NSAIDs including aspirin -Reduce caffeine intake -Return FOBT - POCT CBC  2. Abdominal pain, epigastric -As per directions above  3. Carotid stenosis, bilateral -Follow up with vascular surgeon as planned  Patient Instructions  The patient should continue to hold off the baby aspirin until the GI issues are worked out and blood work is back and we know the results of the FOBT She should follow-up with the vascular surgeon regarding the carotid Doppler report that showed some narrowing in her carotid arteries She has not had any vitamin D  and a month or so and should wait to restart this until we check the next vitamin D level. At that point in time if she still low and vitamin D she should continue this indefinitely.   Arrie Senate MD

## 2014-06-16 NOTE — Patient Instructions (Signed)
The patient should continue to hold off the baby aspirin until the GI issues are worked out and blood work is back and we know the results of the FOBT She should follow-up with the vascular surgeon regarding the carotid Doppler report that showed some narrowing in her carotid arteries She has not had any vitamin D and a month or so and should wait to restart this until we check the next vitamin D level. At that point in time if she still low and vitamin D she should continue this indefinitely.

## 2014-06-17 ENCOUNTER — Other Ambulatory Visit: Payer: Medicare Other

## 2014-06-17 DIAGNOSIS — Z1212 Encounter for screening for malignant neoplasm of rectum: Secondary | ICD-10-CM

## 2014-06-17 NOTE — Progress Notes (Signed)
LAB ONLY 

## 2014-06-18 LAB — FECAL OCCULT BLOOD, IMMUNOCHEMICAL: Fecal Occult Bld: NEGATIVE

## 2014-06-22 ENCOUNTER — Telehealth: Payer: Self-pay | Admitting: Family Medicine

## 2014-06-22 NOTE — Telephone Encounter (Signed)
She should follow through with the appointment with the gastroenterologist

## 2014-06-23 NOTE — Telephone Encounter (Signed)
Pt aware.

## 2014-06-26 DIAGNOSIS — D649 Anemia, unspecified: Secondary | ICD-10-CM | POA: Diagnosis not present

## 2014-06-26 DIAGNOSIS — R195 Other fecal abnormalities: Secondary | ICD-10-CM | POA: Diagnosis not present

## 2014-07-03 DIAGNOSIS — D649 Anemia, unspecified: Secondary | ICD-10-CM | POA: Diagnosis not present

## 2014-07-11 ENCOUNTER — Other Ambulatory Visit: Payer: Self-pay | Admitting: Family Medicine

## 2014-07-11 MED ORDER — FENOFIBRATE 145 MG PO TABS: 145.0000 mg | ORAL_TABLET | Freq: Every day | ORAL | Status: DC

## 2014-07-11 NOTE — Telephone Encounter (Signed)
Refill sent in to pharmacy 

## 2014-07-14 ENCOUNTER — Telehealth: Payer: Self-pay | Admitting: Family Medicine

## 2014-07-14 NOTE — Telephone Encounter (Signed)
Please reveiw

## 2014-07-14 NOTE — Telephone Encounter (Signed)
appt changed

## 2014-07-21 ENCOUNTER — Encounter: Payer: Self-pay | Admitting: Family Medicine

## 2014-07-21 ENCOUNTER — Ambulatory Visit (INDEPENDENT_AMBULATORY_CARE_PROVIDER_SITE_OTHER): Payer: Medicare Other | Admitting: Family Medicine

## 2014-07-21 VITALS — BP 129/59 | HR 54 | Temp 98.4°F | Ht 66.0 in | Wt 174.0 lb

## 2014-07-21 DIAGNOSIS — I6523 Occlusion and stenosis of bilateral carotid arteries: Secondary | ICD-10-CM | POA: Diagnosis not present

## 2014-07-21 DIAGNOSIS — E559 Vitamin D deficiency, unspecified: Secondary | ICD-10-CM | POA: Diagnosis not present

## 2014-07-21 DIAGNOSIS — Z0000 Encounter for general adult medical examination without abnormal findings: Secondary | ICD-10-CM

## 2014-07-21 DIAGNOSIS — E785 Hyperlipidemia, unspecified: Secondary | ICD-10-CM

## 2014-07-21 LAB — POCT CBC
GRANULOCYTE PERCENT: 57 % (ref 37–80)
HCT, POC: 42.9 % (ref 37.7–47.9)
Hemoglobin: 12.8 g/dL (ref 12.2–16.2)
Lymph, poc: 2.5 (ref 0.6–3.4)
MCH: 27.8 pg (ref 27–31.2)
MCHC: 29.9 g/dL — AB (ref 31.8–35.4)
MCV: 93.2 fL (ref 80–97)
MPV: 7.7 fL (ref 0–99.8)
PLATELET COUNT, POC: 239 10*3/uL (ref 142–424)
POC Granulocyte: 3.7 (ref 2–6.9)
POC LYMPH PERCENT: 37.9 %L (ref 10–50)
RBC: 4.61 M/uL (ref 4.04–5.48)
RDW, POC: 14 %
WBC: 6.5 10*3/uL (ref 4.6–10.2)

## 2014-07-21 NOTE — Patient Instructions (Addendum)
Medicare Annual Wellness Visit  [facility] and the medical providers at [facility] strive to bring you the best medical care.  In doing so we not only want to address your current medical conditions and concerns but also to detect new conditions early and prevent illness, disease and health-related problems.    Medicare offers a yearly Wellness Visit which allows our clinical staff to assess your need for preventative services including immunizations, lifestyle education, counseling to decrease risk of preventable diseases and screening for fall risk and other medical concerns.    This visit is provided free of charge (no copay) for all Medicare recipients. The clinical pharmacists at [facility] have begun to conduct these Wellness Visits which will also include a thorough review of all your medications.    As you primary medical provider recommend that you make an appointment for your Annual Wellness Visit if you have not done so already this year.  You may set up this appointment before you leave today or you may call back ([phone]) and schedule an appointment.  Please make sure when you call that you mention that you are scheduling your Annual Wellness Visit with the clinical pharmacist so that the appointment may be made for the proper length of time.     Continue current medications. Continue good therapeutic lifestyle changes which include good diet and exercise. Fall precautions discussed with patient. If an FOBT was given today- please return it to our front desk. If you are over 33 years old - you may need Prevnar 43 or the adult Pneumonia vaccine.  Flu Shots are still available at our office. If you still haven't had one please call to set up a nurse visit to get one.   After your visit with [place] today you will receive a survey in the mail or online from [employer] regarding your care with [place]. Please take a moment to  fill this out. Your feedback is very important to [place] as you can help [place] better understand your patient needs as well as improve your experience and satisfaction. WE CARE ABOUT YOU!!!   Follow-up with cardiology as planned Continue to walk and exercise regularly and keep your weight down as much as possible Continue to watch sodium intake Always drink plenty of water and fluids

## 2014-07-21 NOTE — Progress Notes (Signed)
Subjective:    Patient ID: Carol [name], female    DOB: [birth year], 69 y.o.   MRN: [id number]  HPI Pt here for follow up and management of chronic medical problems which includes hyperlipidemia. She is taking medications regularly. The patient is doing well today with no specific complaints. She went to see the gastroenterologist and he could find no problems with her GI tract or blood work and she is happy with this. She has a visit planned with the cardiologist tomorrow because of a family history incident with her brother dying at 34 suddenly with no risk factors. An autopsy was not done in [place] not sure of the cause of death. She did have carotid studies done on one of the mobile unit's ago from community to community and this indicated sock edges in her carotids. She denies chest pain shortness of breath walks and exercises regularly. She denies any GI symptoms today trouble swallowing heartburn indigestion nausea vomiting or diarrhea. She did also denies any problems with voiding. She looks healthy and appears to be in good health and is in good spirits.         Patient Active Problem List   Diagnosis Date Noted  . Family history of heart disease 07/28/2013  . Need for prophylactic vaccination and inoculation against influenza 11/26/2012  . Diverticulosis   . Osteopenia 09/11/2012  . HLD (hyperlipidemia) 07/23/2012  . H/O infectious disease 01/02/2012  . Pseudoptosis 11/07/2011   Outpatient Encounter Prescriptions as of 07/21/2014  Medication Sig  . Calcium 500-125 MG-UNIT TABS 1 tablet. 1 tablet daily.  . Cholecalciferol (PA VITAMIN D-3) 2000 UNITS CAPS 1 capsule. 1 capsule daily.  Marland Kitchen ezetimibe-simvastatin (VYTORIN) 10-20 MG per tablet Take 1 tablet by mouth at bedtime.  . fenofibrate (TRICOR) 145 MG tablet Take 1 tablet (145 mg total) by mouth daily.  . Multiple Vitamins tablet 1 tablet. 1 tablet daily.  . OMEGA-3 1000 MG CAPS 2 g 2 times daily.   No  facility-administered encounter medications on file as of 07/21/2014.     Review of Systems  Constitutional: Negative.   HENT: Negative.   Eyes: Negative.   Respiratory: Negative.   Cardiovascular: Negative.   Gastrointestinal: Negative.   Endocrine: Negative.   Genitourinary: Negative.   Musculoskeletal: Negative.   Skin: Negative.   Allergic/Immunologic: Negative.   Neurological: Negative.   Hematological: Negative.   Psychiatric/Behavioral: Negative.        Objective:   Physical Exam  Constitutional: She is oriented to person, place, and time. She appears well-developed and well-nourished.  HENT:  Head: Normocephalic and atraumatic.  Right Ear: External ear normal.  Left Ear: External ear normal.  Nose: Nose normal.  Mouth/Throat: Oropharynx is clear and moist.  Eyes: Conjunctivae and EOM are normal. Pupils are equal, round, and reactive to light. Right eye exhibits no discharge. Left eye exhibits no discharge. No scleral icterus.  Neck: Normal range of motion. Neck supple. No thyromegaly present.  No carotid bruits or anterior cervical adenopathy or thyromegaly.  Cardiovascular: Normal rate, regular rhythm, normal heart sounds and intact distal pulses.   No murmur heard. At 60/m  Pulmonary/Chest: Effort normal and breath sounds normal. No respiratory distress. She has no wheezes. She has no rales. She exhibits no tenderness.  Lungs are clear anteriorly and posteriorly  Abdominal: Soft. Bowel sounds are normal. She exhibits no mass. There is no tenderness. There is no rebound and no guarding.  No organ enlargement or abdominal tenderness  Musculoskeletal: Normal  range of motion. She exhibits no edema or tenderness.  Lymphadenopathy:    She has no cervical adenopathy.  Neurological: She is alert and oriented to person, place, and time. She has normal reflexes. No cranial nerve deficit.  Skin: Skin is warm and dry. No rash noted.  Psychiatric: She has a normal mood and  affect. Her behavior is normal. Judgment and thought content normal.  Nursing note and vitals reviewed.  BP 129/59 mmHg  Pulse 54  Temp(Src) 98.4 F (36.9 C) (Oral)  Ht $R'5\' 6"'qv$  (1.676 m)  Wt 174 lb (78.926 kg)  BMI 28.10 kg/m2        Assessment & Plan:  1. HLD (hyperlipidemia) -Continue current treatment pending results of lab work - POCT CBC - BMP8+EGFR - Hepatic function panel - NMR, lipoprofile  2. Vitamin D deficiency -Continue current treatment pending results of lab work - POCT CBC - Vit D  25 hydroxy (rtn osteoporosis monitoring)  3. Health care maintenance -Follow-up with cardiology as planned tomorrow because of the sudden death of sibling with a negative family history - POCT CBC - BMP8+EGFR - Hepatic function panel   No orders of the defined types were placed in this encounter.   Patient Instructions                       Medicare Annual Wellness Visit  [facility] and the medical providers at [facility] strive to bring you the best medical care.  In doing so we not only want to address your current medical conditions and concerns but also to detect new conditions early and prevent illness, disease and health-related problems.    Medicare offers a yearly Wellness Visit which allows our clinical staff to assess your need for preventative services including immunizations, lifestyle education, counseling to decrease risk of preventable diseases and screening for fall risk and other medical concerns.    This visit is provided free of charge (no copay) for all Medicare recipients. The clinical pharmacists at [facility] have begun to conduct these Wellness Visits which [name] also include a thorough review of all your medications.    As you primary medical provider recommend that you make an appointment for your Annual Wellness Visit if you have not done so already this year.  You may set up this appointment before you  leave today or you may call back ([phone]) and schedule an appointment.  Please make sure when you call that you mention that you are scheduling your Annual Wellness Visit with the clinical pharmacist so that the appointment may be made for the proper length of time.     Continue current medications. Continue good therapeutic lifestyle changes which include good diet and exercise. Fall precautions discussed with patient. If an FOBT was given today- please return it to our front desk. If you are over 60 years old - you may need Prevnar 65 or the adult Pneumonia vaccine.  Flu Shots are still available at our office. If you still haven't had one please call to set up a nurse visit to get one.   After your visit with [place] today you [name] receive a survey in the mail or online from [employer] regarding your care with [place]. Please take a moment to fill this out. Your feedback is very important to [place] as you can help [place] better understand your patient needs as well as improve your experience and satisfaction. WE CARE ABOUT YOU!!!  Follow-up with cardiology as planned Continue to walk and exercise regularly and keep your weight down as much as possible Continue to watch sodium intake Always drink plenty of water and fluids   Arrie Senate MD

## 2014-07-22 ENCOUNTER — Ambulatory Visit: Payer: 59 | Admitting: Internal Medicine

## 2014-07-22 ENCOUNTER — Ambulatory Visit (INDEPENDENT_AMBULATORY_CARE_PROVIDER_SITE_OTHER): Payer: Medicare Other | Admitting: Internal Medicine

## 2014-07-22 ENCOUNTER — Encounter: Payer: Self-pay | Admitting: Internal Medicine

## 2014-07-22 VITALS — BP 140/72 | HR 62 | Ht 66.0 in | Wt 177.1 lb

## 2014-07-22 DIAGNOSIS — Z8249 Family history of ischemic heart disease and other diseases of the circulatory system: Secondary | ICD-10-CM

## 2014-07-22 DIAGNOSIS — I6529 Occlusion and stenosis of unspecified carotid artery: Secondary | ICD-10-CM | POA: Diagnosis not present

## 2014-07-22 DIAGNOSIS — E785 Hyperlipidemia, unspecified: Secondary | ICD-10-CM | POA: Diagnosis not present

## 2014-07-22 DIAGNOSIS — I6523 Occlusion and stenosis of bilateral carotid arteries: Secondary | ICD-10-CM | POA: Diagnosis not present

## 2014-07-22 LAB — HEPATIC FUNCTION PANEL
ALT: 16 IU/L (ref 0–32)
AST: 17 IU/L (ref 0–40)
Albumin: 4.6 g/dL (ref 3.5–4.8)
Alkaline Phosphatase: 48 IU/L (ref 39–117)
BILIRUBIN TOTAL: 0.5 mg/dL (ref 0.0–1.2)
BILIRUBIN, DIRECT: 0.18 mg/dL (ref 0.00–0.40)
TOTAL PROTEIN: 7.3 g/dL (ref 6.0–8.5)

## 2014-07-22 LAB — NMR, LIPOPROFILE
Cholesterol: 177 mg/dL (ref 100–199)
HDL Cholesterol by NMR: 68 mg/dL (ref >39)
HDL Particle Number: 47.2 umol/L (ref >=30.5)
LDL PARTICLE NUMBER: 1143 nmol/L — AB (ref <1000)
LDL Size: 21.4 nm (ref >20.5)
LDL-C: 95 mg/dL (ref 0–99)
LP-IR Score: 32 (ref <=45)
SMALL LDL PARTICLE NUMBER: 462 nmol/L (ref <=527)
TRIGLYCERIDES BY NMR: 68 mg/dL (ref 0–149)

## 2014-07-22 LAB — BMP8+EGFR
BUN / CREAT RATIO: 20 (ref 11–26)
BUN: 20 mg/dL (ref 8–27)
CALCIUM: 9.3 mg/dL (ref 8.7–10.3)
CO2: 24 mmol/L (ref 18–29)
CREATININE: 1.02 mg/dL — AB (ref 0.57–1.00)
Chloride: 106 mmol/L (ref 97–108)
GFR calc Af Amer: 64 mL/min/{1.73_m2} (ref >59)
GFR, EST NON AFRICAN AMERICAN: 56 mL/min/{1.73_m2} — AB (ref >59)
Glucose: 89 mg/dL (ref 65–99)
Potassium: 4.3 mmol/L (ref 3.5–5.2)
Sodium: 146 mmol/L — ABNORMAL HIGH (ref 134–144)

## 2014-07-22 LAB — VITAMIN D 25 HYDROXY (VIT D DEFICIENCY, FRACTURES): Vit D, 25-Hydroxy: 29.1 ng/mL — ABNORMAL LOW (ref 30.0–100.0)

## 2014-07-22 MED ORDER — ROSUVASTATIN CALCIUM 40 MG PO TABS: 40.0000 mg | ORAL_TABLET | Freq: Every day | ORAL | Status: DC

## 2014-07-22 NOTE — Progress Notes (Signed)
OFFICE NOTE  Chief Complaint:  Risk factor modification, carotid stenosis  Primary Care Physician: Redge Gainer, MD  HPI:  Carol [name] is a pleasant 69 year old female kindly referred to me by Dr. Laurance Flatten for cardiac evaluation. Recently she underwent lifeline screening was found to have carotid stenosis. She had a formal carotid Doppler performed at [facility] which demonstrated mild to moderate bilateral carotid artery disease. She's been adequately managed in fact is on no Vytorin and 5 fenofibrate for elevated cholesterol. Recent labs indicated an LDL-P of 1143, LDL 95, HDL 68 and triglycerides 68. Generally this is good control however given the fact that she has significant carotid artery disease, her targets for cholesterol management are much lower. We should strive for a LDL cholesterol less than 70. She's a symptomatically regards to chest pain or shortness of breath. Her brother recently died with heart attack at age 33 and of course she is appropriately concerned about that. She also has history of murmur which is been followed.  PMHx:  Past Medical History  Diagnosis Date  . Hyperlipidemia   . Osteopenia   . Diverticulosis     No past surgical history on file.  FAMHx:  Family History  Problem Relation Age of Onset  . Heart disease Mother   . Heart disease Father   . Cancer Sister     breast  . Diabetes Sister     SOCHx:   reports that she has never smoked. She does not have any smokeless tobacco history on file. She reports that she does not drink alcohol or use illicit drugs.  ALLERGIES:  Allergies  Allergen Reactions  . Penicillins Hives  . Sulfa Antibiotics   . Sulfamethoxazole Itching    ROS: A comprehensive review of systems was negative.  HOME MEDS: Current Outpatient Prescriptions  Medication Sig Dispense Refill  . aspirin EC 81 MG tablet Take 81 mg by mouth daily.    . Calcium 500-125 MG-UNIT TABS 1 tablet. 1 tablet daily.    .  Cholecalciferol (PA VITAMIN D-3) 2000 UNITS CAPS 1 capsule. 1 capsule daily.    . fenofibrate (TRICOR) 145 MG tablet Take 1 tablet (145 mg total) by mouth daily. 90 tablet 1  . Multiple Vitamins tablet 1 tablet. 1 tablet daily.    . OMEGA-3 1000 MG CAPS 2 g 2 times daily.    . rosuvastatin (CRESTOR) 40 MG tablet Take 1 tablet (40 mg total) by mouth daily. 90 tablet 3   No current facility-administered medications for this visit.    LABS/IMAGING: Results for orders placed or performed in visit on 07/21/14 (from the past 48 hour(s))  BMP8+EGFR     Status: Abnormal   Collection Time: 07/21/14 10:22 AM  Result Value Ref Range   Glucose 89 65 - 99 mg/dL   BUN 20 8 - 27 mg/dL   Creatinine, Ser 1.02 (H) 0.57 - 1.00 mg/dL   GFR calc non Af Amer 56 (L) >59 mL/min/1.73   GFR calc Af Amer 64 >59 mL/min/1.73   BUN/Creatinine Ratio 20 11 - 26   Sodium 146 (H) 134 - 144 mmol/L   Potassium 4.3 3.5 - 5.2 mmol/L   Chloride 106 97 - 108 mmol/L   CO2 24 18 - 29 mmol/L   Calcium 9.3 8.7 - 10.3 mg/dL  Hepatic function panel     Status: None   Collection Time: 07/21/14 10:22 AM  Result Value Ref Range   Total Protein 7.3 6.0 - 8.5 g/dL  Albumin 4.6 3.5 - 4.8 g/dL   Bilirubin Total 0.5 0.0 - 1.2 mg/dL   Bilirubin, Direct 0.18 0.00 - 0.40 mg/dL   Alkaline Phosphatase 48 39 - 117 IU/L   AST 17 0 - 40 IU/L   ALT 16 0 - 32 IU/L  NMR, lipoprofile     Status: Abnormal   Collection Time: 07/21/14 10:22 AM  Result Value Ref Range   LDL Particle Number [id number] (H) <1000 nmol/L    Comment:                           Low                   < 1000                           Moderate         1000 - 1299                           Borderline-High  1300 - 1599                           High             1600 - 2000                           Very High             > 2000    LDL-C 95 0 - 99 mg/dL    Comment:                           Optimal               <  100                           Above optimal     100 -   129                           Borderline        130 -  159                           High              160 -  189                           Very high             >  189 LDL-C is inaccurate if patient is non-fasting.    HDL Cholesterol by NMR 68 >39 mg/dL   Triglycerides by NMR 68 0 - 149 mg/dL   Cholesterol 177 100 - 199 mg/dL   HDL Particle Number [id number] >=30.5 umol/L   Small LDL Particle Number [id number] <=527 nmol/L   LDL Size 21.4 >20.5 nm    Comment:  ----------------------------------------------------------                  ** INTERPRETATIVE INFORMATION**  PARTICLE CONCENTRATION AND SIZE                     <--Lower CVD Risk   Higher CVD Risk-->   LDL AND HDL PARTICLES   Percentile in Reference Population   HDL-P (total)        High     75th    50th    25th   Low                        >34.9    34.9    30.5    26.7   <26.7   Small LDL-P          Low      25th    50th    75th   High                        <117     117     527     839    >839   LDL Size   <-Large (Pattern A)->    <-Small (Pattern B)->                     23.0    20.6           20.5      19.0  ---------------------------------------------------------- Small LDL-P and LDL Size are associated with CVD risk, but not after LDL-P is taken into account. These assays were developed and their performance characteristics determined by [employer]. These assays have not been cleared by the [place] Food and Drug Administration. The clinical utility o f these laboratory values have not been fully established.    LP-IR Score 32 <=45    Comment: INSULIN RESISTANCE MARKER     <--Insulin Sensitive    Insulin Resistant-->            Percentile in Reference Population Insulin Resistance Score LP-IR Score   Low   25th   50th   75th   High               <27   27     45     63     >63 LP-IR Score is inaccurate if patient is non-fasting. The LP-IR score is a laboratory developed index that has been associated with insulin  resistance and diabetes risk and should be used as one component of a physician's clinical assessment. The LP-IR score listed above has not been cleared by the [place] Food and Drug Administration.   Vit D  25 hydroxy (rtn osteoporosis monitoring)     Status: Abnormal   Collection Time: 07/21/14 10:22 AM  Result Value Ref Range   Vit D, 25-Hydroxy 29.1 (L) 30.0 - 100.0 ng/mL    Comment: Vitamin D deficiency has been defined by the [place] practice guideline as a level of serum 25-OH vitamin D less than 20 ng/mL (1,2). The Endocrine Society went on to further define vitamin D insufficiency as a level between 21 and 29 ng/mL (2). 1. IOM (Institute of Medicine). 2010. Dietary reference    intakes for calcium and D. [place]: The    [employer]. 2. Holick MF, Binkley [place], Bischoff-Ferrari HA, et al.    Evaluation, treatment, and prevention of vitamin D    deficiency: an Endocrine Society clinical practice    guideline. JCEM. 2011 Jul;  96(7):1911-30.   POCT CBC     Status: Abnormal   Collection Time: 07/21/14 10:33 AM  Result Value Ref Range   WBC 6.5 4.6 - 10.2 K/uL   Lymph, poc 2.5 0.6 - 3.4   POC LYMPH PERCENT 37.9 10 - 50 %L   POC Granulocyte 3.7 2 - 6.9   Granulocyte percent 57.0 37 - 80 %G   RBC 4.61 4.04 - 5.48 M/uL   Hemoglobin 12.8 12.2 - 16.2 g/dL   HCT, POC 42.9 37.7 - 47.9 %   MCV 93.2 80 - 97 fL   MCH, POC 27.8 27 - 31.2 pg   MCHC 29.9 (A) 31.8 - 35.4 g/dL   RDW, POC 14.0 %   Platelet Count, POC 239 142 - 424 K/uL   MPV 7.7 0 - 99.8 fL   No results found.  WEIGHTS: Wt Readings from Last 3 Encounters:  07/22/14 177 lb 1.6 oz (80.332 kg)  07/21/14 174 lb (78.926 kg)  06/16/14 175 lb (79.379 kg)    VITALS: BP 140/72 mmHg  Pulse 62  Ht $R'5\' 6"'Up$  (1.676 m)  Wt 177 lb 1.6 oz (80.332 kg)  BMI 28.60 kg/m2  EXAM: General appearance: alert and no distress Neck: no carotid bruit and no JVD Lungs: clear to  auscultation bilaterally Heart: regular rate and rhythm, S1, S2 normal and systolic murmur: early systolic 2/6, blowing at apex Abdomen: soft, non-tender; bowel sounds normal; no masses,  no organomegaly Extremities: extremities normal, atraumatic, no cyanosis or edema Pulses: 2+ and symmetric Skin: Skin color, texture, turgor normal. No rashes or lesions Neurologic: Grossly normal Psych: Pleasant  EKG: Normal sinus rhythm at 62  ASSESSMENT: 1. Bilateral carotid artery stenosis 2. Dyslipidemia 3. Family history of premature coronary artery disease  PLAN: 1.   Mrs. Moorehouse has carotid artery disease which is mild to moderate. She needs intensive statin therapy. There is clear data on the effects of high-dose statin medications with regards to plaque stabilization and possibly small plaque regression. Options may include the medications such as Lipitor and Crestor which should been studied for this. We could also consider increasing her Vytorin to get her cholesterol more to goal. She says that previously on simvastatin 40 she had some muscle pains. I think it may be better to try to switch her to a high-dose of a more potent statin such as Crestor. This recently became generic and should be more affordable. His estimated cost about $60 a month whereas her current medication cost her over $200 a month. I do think she'll get better cholesterol improvement with it. The FDA's recently made note of combination therapy with statins and fibroids. Simvastatin and fenofibrate together do significantly increase the risk of myalgias. Crestor may reduce that risk some but if she continues to have problems with myalgias we may wish to consider a lower dose of fenofibrate as her triglycerides are quite low. Another option would be a concentrated EPA such as Vascepa. I also recommended that she had coenzyme Q10 100 g daily to her diet which could help reduce her risk of myalgias. For now will change her statin and  this could be followed in your office. We will plan to repeat her carotid Dopplers in 1 year and keep a close eye on them.  Thank you for the kind referral.  Pixie Casino, MD, [facility] Attending Cardiologist [place] 07/22/2014, 5:45 PM

## 2014-07-22 NOTE — Patient Instructions (Addendum)
Your physician has recommended you make the following change in your medication: STOP vytorin - START crestor 40mg  once daily   Try Co-Q10 179mcg (one daily) - over the counter   Dr. Debara Pickett has ordered a carotid doppler study to be done in 1 year.   Your physician wants you to follow-up in: 1 year with Dr. Debara Pickett. You will receive a reminder letter in the mail two months in advance. If you don't receive a letter, please call our office to schedule the follow-up appointment.

## 2014-07-23 ENCOUNTER — Telehealth: Payer: Self-pay | Admitting: *Deleted

## 2014-07-23 ENCOUNTER — Ambulatory Visit: Payer: Medicare Other | Admitting: Family Medicine

## 2014-07-23 NOTE — Telephone Encounter (Signed)
-----   Message from Chipper Herb, MD sent at 07/22/2014  4:42 PM EDT ----- The CBC has a normal white blood cell count. The hemoglobin is good at 12.8 and the platelet count is adequate. The blood sugar is good at 89. The creatinine, the most important kidney function test is slightly elevated at 1.02. The patient should continue to watch and avoid all NSAIDs as much as possible, like ibuprofen and Aleve All liver function tests are within normal limits The total LDL particle number is elevated at 1143. LDL C is good at 95. The triglycerides are good at 68.------ the patient has a strong family history of heart disease and death from heart disease.-------- please arrange visit with the clinical pharmacists for more aggressive treatment and management of lipids----she should continue with aggressive therapeutic lifestyle changes including diet and exercise plus appointments with cardiology as already planned The vitamin D is low.------- if the patient is taking vitamin D3 2000 daily, she should increase this to 2000 daily Monday through Friday and take 4000 on Saturday and Sunday

## 2014-07-24 ENCOUNTER — Telehealth: Payer: Self-pay | Admitting: Family Medicine

## 2014-07-24 NOTE — Telephone Encounter (Signed)
Patient aware of results.

## 2014-07-29 ENCOUNTER — Ambulatory Visit: Payer: Medicare Other | Admitting: Family Medicine

## 2014-07-31 ENCOUNTER — Ambulatory Visit (INDEPENDENT_AMBULATORY_CARE_PROVIDER_SITE_OTHER): Payer: Medicare Other | Admitting: Physician Assistant

## 2014-07-31 ENCOUNTER — Encounter: Payer: Self-pay | Admitting: Physician Assistant

## 2014-07-31 VITALS — BP 140/71 | HR 70 | Temp 97.6°F | Ht 66.0 in | Wt 180.6 lb

## 2014-07-31 DIAGNOSIS — I6523 Occlusion and stenosis of bilateral carotid arteries: Secondary | ICD-10-CM

## 2014-07-31 DIAGNOSIS — W57XXXA Bitten or stung by nonvenomous insect and other nonvenomous arthropods, initial encounter: Secondary | ICD-10-CM | POA: Diagnosis not present

## 2014-07-31 DIAGNOSIS — T148 Other injury of unspecified body region: Secondary | ICD-10-CM

## 2014-07-31 NOTE — Patient Instructions (Signed)
Tick Bite Information Ticks are insects that attach themselves to the skin and draw blood for food. There are various types of ticks. Common types include wood ticks and deer ticks. Most ticks live in shrubs and grassy areas. Ticks can climb onto your body when you make contact with leaves or grass where the tick is waiting. The most common places on the body for ticks to attach themselves are the scalp, neck, armpits, waist, and groin. Most tick bites are harmless, but sometimes ticks carry germs that cause diseases. These germs can be spread to a person during the tick's feeding process. The chance of a disease spreading through a tick bite depends on:   The type of tick.  Time of year.   How long the tick is attached.   Geographic location.  HOW CAN YOU PREVENT TICK BITES? Take these steps to help prevent tick bites when you are outdoors:  Wear protective clothing. Long sleeves and long pants are best.   Wear white clothes so you can see ticks more easily.  Tuck your pant legs into your socks.   If walking on a trail, stay in the middle of the trail to avoid brushing against bushes.  Avoid walking through areas with long grass.  Put insect repellent on all exposed skin and along boot tops, pant legs, and sleeve cuffs.   Check clothing, hair, and skin repeatedly and before going inside.   Brush off any ticks that are not attached.  Take a shower or bath as soon as possible after being outdoors.  WHAT IS THE PROPER WAY TO REMOVE A TICK? Ticks should be removed as soon as possible to help prevent diseases caused by tick bites. 1. If latex gloves are available, put them on before trying to remove a tick.  2. Using fine-point tweezers, grasp the tick as close to the skin as possible. You may also use curved forceps or a tick removal tool. Grasp the tick as close to its head as possible. Avoid grasping the tick on its body. 3. Pull gently with steady upward pressure until  the tick lets go. Do not twist the tick or jerk it suddenly. This may break off the tick's head or mouth parts. 4. Do not squeeze or crush the tick's body. This could force disease-carrying fluids from the tick into your body.  5. After the tick is removed, wash the bite area and your hands with soap and water or other disinfectant such as alcohol. 6. Apply a small amount of antiseptic cream or ointment to the bite site.  7. Wash and disinfect any instruments that were used.  Do not try to remove a tick by applying a hot match, petroleum jelly, or fingernail polish to the tick. These methods do not work and may increase the chances of disease being spread from the tick bite.  WHEN SHOULD YOU SEEK MEDICAL CARE? Contact your health care provider if you are unable to remove a tick from your skin or if a part of the tick breaks off and is stuck in the skin.  After a tick bite, you need to be aware of signs and symptoms that could be related to diseases spread by ticks. Contact your health care provider if you develop any of the following in the days or weeks after the tick bite:  Unexplained fever.  Rash. A circular rash that appears days or weeks after the tick bite may indicate the possibility of Lyme disease. The rash may resemble   a target with a bull's-eye and may occur at a different part of your body than the tick bite.  Redness and swelling in the area of the tick bite.   Tender, swollen lymph glands.   Diarrhea.   Weight loss.   Cough.   Fatigue.   Muscle, joint, or bone pain.   Abdominal pain.   Headache.   Lethargy or a change in your level of consciousness.  Difficulty walking or moving your legs.   Numbness in the legs.   Paralysis.  Shortness of breath.   Confusion.   Repeated vomiting.  Document Released: 02/11/2000 Document Revised: 12/04/2012 Document Reviewed: 07/24/2012 [employer] Patient Information 2015 [employer]. This information is  not intended to replace advice given to you by your health care provider. Make sure you discuss any questions you have with your health care provider.  

## 2014-07-31 NOTE — Progress Notes (Signed)
Subjective:     Patient ID: Carol [name], female   DOB: Apr 08, 1945, 69 y.o.   MRN: [id number]  HPI Pt with pruritus to the back Possible tick bite with imbedded tick No fever, chills, or rash  Review of Systems     Objective:   Physical Exam Small tick just below the L scapula No surrounding erythema, edema, or induration Cleansed area able to remove complete tick with gentle traction Area cleansed    Assessment:     Tick bite    Plan:     Nl course reviewed Pt to f/u if any fever, chills, or rash OTC antihisatmines for sx relief

## 2014-08-06 ENCOUNTER — Encounter: Payer: Self-pay | Admitting: Pharmacist

## 2014-08-06 ENCOUNTER — Ambulatory Visit (INDEPENDENT_AMBULATORY_CARE_PROVIDER_SITE_OTHER): Payer: Medicare Other | Admitting: Pharmacist

## 2014-08-06 VITALS — BP 124/70 | HR 62 | Ht 66.0 in | Wt 180.0 lb

## 2014-08-06 DIAGNOSIS — I6523 Occlusion and stenosis of bilateral carotid arteries: Secondary | ICD-10-CM

## 2014-08-06 DIAGNOSIS — E785 Hyperlipidemia, unspecified: Secondary | ICD-10-CM | POA: Diagnosis not present

## 2014-08-06 NOTE — Progress Notes (Signed)
Lipid Clinic Consultation  Chief Complaint:  Patient referred by Dr Laurance Flatten to discuss diet and CV risk   HPI:  Mrs Starzyk has recently seen Dr Debara Pickett - cardiologist.  She was determined to have bilateral carotid artery stenosis of mild to moderate severity.  Dr Debara Pickett discontinued Vytorin 10/20mg  1 tablet daily and started Crestor 40mg  daily.  Patient has not stopped Vytorin and started Crestor yet.  She is waiting until after vacation next week.  She has a very significant family history of CAD.  Patient has tried atorvastatin in past and has experienced myalgias.  She has tolerated Vytorin (low dose) well.      Component Value Date/Time   CHOL 177 07/21/2014 1022   CHOL 151 10/22/2012 0833   TRIG 68 07/21/2014 1022   TRIG 80 10/22/2012 0833   HDL 68 07/21/2014 1022   HDL 51 10/22/2012 0833   Filed Vitals:   08/06/14 0945  BP: 124/70  Pulse: 62   Body mass index is 29.07 kg/(m^2).  CHD/CHF Risk Equivalents:  bilateral carotid stenosis Primary Problem(s):  LDL or LDL-P elevated  Current NCEP Goals: LDL Goal < 70 HDL Goal >/= 45 Tg Goal < 150 Non-HDL Goal < 100  Secondary cause of hyperlipidemia present:  none Low fat diet followed?  Yes - but some room for improvement Low carb diet followed?  No - patient mostly limiting high CHO foods but she admits to eating bread more often then she should Exercise?  Yes - walking 3 miles 4 times per week  Assessment: Hyperlipidemia with CAD - goal LDL is less than 70 - patient is not currently at goal Bilateral carotid artery stenosis  Plan:   1.  Discussed in depth CVD risk and prevention for worsening CAD.  2.  Patient to start Crestor 40mg  qd after vacation next week.  She will continue Vytorin until that change is made. 3.  Discontinue fenofibrate - triglycerides have been less than 150 over last few years.  In light of past history of intolerance to atorvastatin due to myalgias I think taking fenofibrate with Crestor might increase  the change of experiencing myalgias.   4.  Diet - reviewed Mediterranean Diet and gave handout.  Patient to decrease servings of bread as well which will help to keep triglycerides low. 5.  Continue regular walking - great job.   RTC in 2 months - will recheck lipids / do AWV and recher DEXA.    Cherre Robins, PharmD, CPP

## 2014-09-08 DIAGNOSIS — H5212 Myopia, left eye: Secondary | ICD-10-CM | POA: Diagnosis not present

## 2014-09-08 DIAGNOSIS — Z961 Presence of intraocular lens: Secondary | ICD-10-CM | POA: Diagnosis not present

## 2014-09-21 ENCOUNTER — Other Ambulatory Visit: Payer: Self-pay | Admitting: Dermatology

## 2014-09-21 DIAGNOSIS — D485 Neoplasm of uncertain behavior of skin: Secondary | ICD-10-CM | POA: Diagnosis not present

## 2014-09-21 DIAGNOSIS — L570 Actinic keratosis: Secondary | ICD-10-CM | POA: Diagnosis not present

## 2014-09-21 DIAGNOSIS — L821 Other seborrheic keratosis: Secondary | ICD-10-CM | POA: Diagnosis not present

## 2014-10-07 ENCOUNTER — Encounter: Payer: Self-pay | Admitting: Pharmacist

## 2014-10-07 ENCOUNTER — Ambulatory Visit (INDEPENDENT_AMBULATORY_CARE_PROVIDER_SITE_OTHER): Payer: Medicare Other | Admitting: Pharmacist

## 2014-10-07 ENCOUNTER — Ambulatory Visit (INDEPENDENT_AMBULATORY_CARE_PROVIDER_SITE_OTHER): Payer: Medicare Other

## 2014-10-07 VITALS — BP 138/80 | HR 60 | Ht 65.75 in | Wt 168.0 lb

## 2014-10-07 DIAGNOSIS — R7989 Other specified abnormal findings of blood chemistry: Secondary | ICD-10-CM

## 2014-10-07 DIAGNOSIS — E785 Hyperlipidemia, unspecified: Secondary | ICD-10-CM

## 2014-10-07 DIAGNOSIS — M899 Disorder of bone, unspecified: Secondary | ICD-10-CM | POA: Diagnosis not present

## 2014-10-07 DIAGNOSIS — H9193 Unspecified hearing loss, bilateral: Secondary | ICD-10-CM

## 2014-10-07 DIAGNOSIS — M8580 Other specified disorders of bone density and structure, unspecified site: Secondary | ICD-10-CM

## 2014-10-07 DIAGNOSIS — Z0000 Encounter for general adult medical examination without abnormal findings: Secondary | ICD-10-CM

## 2014-10-07 NOTE — Progress Notes (Signed)
Patient ID: Carol [name], female   DOB: 1945-06-25, 69 y.o.   MRN: [id number]    Subjective:   Carol [name] is a 69 y.o. female who presents for an Initial Medicare Annual Wellness Visit.  [name] is a WF.  She has been widowed since 2009.  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    Current Medications (verified) Outpatient Encounter Prescriptions as of 10/07/2014  Medication Sig  . aspirin EC 81 MG tablet Take 81 mg by mouth daily.  . Multiple Vitamins tablet 1 tablet. 1 tablet daily.  . OMEGA-3 1000 MG CAPS 2 g daily  . rosuvastatin (CRESTOR) 40 MG tablet Take 40 mg by mouth daily.  . Vitamin D, Ergocalciferol, (DRISDOL) 50000 UNITS CAPS capsule Take 50,000 Units by mouth every 7 (seven) days.  . Calcium 500-125 MG-UNIT TABS 1 tablet. 1 tablet daily.  . Cholecalciferol (PA VITAMIN D-3) 2000 UNITS CAPS 1 capsule. 1 capsule daily.   No facility-administered encounter medications on file as of 10/07/2014.    Allergies (verified) Penicillins; Sulfa antibiotics; and Sulfamethoxazole   History: Past Medical History  Diagnosis Date  . Hyperlipidemia   . Osteopenia   . Diverticulosis   . Carotid artery stenosis   . Precancerous skin lesion 2016  . Cataract    Past Surgical History  Procedure Laterality Date  . Eye surgery     Family History  Problem Relation Age of Onset  . Heart disease Mother   . Heart disease Father   . Cancer Sister     breast  . Hyperlipidemia Sister   . Diabetes Sister   . Cancer Sister     bone  . Heart disease Brother   . CAD Brother   . Cancer Sister     breast  . Kidney disease Sister   . Heart disease Brother   . Hypertension Brother   . Kidney disease Brother    Social History   Occupational History  . Not on file.   Social History Main Topics  . Smoking status: Never Smoker   . Smokeless tobacco: Never Used  . Alcohol Use: No  . Drug Use: No  . Sexual Activity: No    Do you feel safe at home?  Yes  Dietary  issues and exercise activities: Current Exercise Habits:: Home exercise routine, Type of exercise: walking, Time (Minutes): 45, Frequency (Times/Week): 4, Weekly Exercise (Minutes/Week): 180, Intensity: Moderate  Current Dietary habits:  Eats low fat diet; lots of vegetables.  Objective:    Today's Vitals   10/07/14 0856  BP: 138/80  Pulse: 60  Height: 5' 5.75" (1.67 m)  Weight: 168 lb (76.204 kg)  PainSc: 0-No pain   Body mass index is 27.32 kg/(m^2).  Activities of Daily Living In your present state of health, do you have any difficulty performing the following activities: 10/07/2014  Hearing? Y  Vision? N  Difficulty concentrating or making decisions? N  Walking or climbing stairs? N  Dressing or bathing? N  Doing errands, shopping? N  Preparing Food and eating ? N  Using the Toilet? N  In the past six months, have you accidently leaked urine? N  Do you have problems with loss of bowel control? N  Managing your Medications? N  Managing your Finances? N  Housekeeping or managing your Housekeeping? N    Are there smokers in your home (other than you)? No   Cardiac Risk Factors include: advanced age (>16men, >73 women);dyslipidemia;family history of premature cardiovascular  disease  Depression Screen PHQ 2/9 Scores 10/07/2014 07/21/2014 06/16/2014 01/27/2014  PHQ - 2 Score 0 0 0 0    Fall Risk Fall Risk  10/07/2014 07/21/2014 06/16/2014 01/27/2014 08/27/2013  Falls in the past year? Yes No No Yes No  Number falls in past yr: 1 - - 1 -  Injury with Fall? No - - Yes -  Follow up Falls prevention discussed - - - -    Cognitive Function: No flowsheet data found.  Immunizations and Health Maintenance Immunization History  Administered Date(s) Administered  . Influenza,inj,Quad PF,36+ Mos 11/26/2012, 11/26/2013  . Tdap 02/27/2006  . Zoster 12/19/2013   Health Maintenance Due  Topic Date Due  . Hepatitis C Screening  [birth year]  . DEXA SCAN  08/28/2014  . INFLUENZA  VACCINE  09/28/2014    Patient Care Team: Chipper Herb, MD as PCP - General (Family Medicine)  Indicate any recent Medical Services you may have received from other than [facility] providers in the past year (date may be approximate).    Assessment:    Annual Wellness Visit  Overweight - patient has lost about 12 lbs with Weight Watches over the last 3 months.  Great job.  Decreased hearing both ears - related to previous job per patient Osteopenia with stable BMD and low fracture risk per FRAX estimate (9.4% total and 1.2% hip) Low serum vitamin D   Screening Tests Health Maintenance  Topic Date Due  . Hepatitis C Screening  August 07, 1945  . DEXA SCAN  08/28/2014  . INFLUENZA VACCINE  09/28/2014  . PNA vac Low Risk Adult (1 of 2 - PCV13) 07/20/2016 (Originally 09/24/2008)  . MAMMOGRAM  12/12/2014  . TETANUS/TDAP  02/28/2016  . COLONOSCOPY  05/24/2021  . ZOSTAVAX  Completed        Plan:   During the course of the visit Carol [name] was educated and counseled about the following appropriate screening and preventive services:   Vaccines to include Pneumoccal, Influenza, Hepatitis B, Td, Zostavax - patient refused pneumonia vaccine  Colorectal cancer screening - UTD  Cardiovascular disease screening - EKG UTD; BP is a goal today.  Last lipids WNL  Diabetes screening - UTD  Bone Denisty / Osteoporosis Screening - Done today  Mammogram - UTD  PAP - UTD  Glaucoma screening / Eye Exam - uTd  Nutrition counseling - continue with weight watchers  Advanced Directives - patient reminded to update.  She has not updated since husband died.  Continue with daily walking.  Patient due to have lipids/CMP and vitamin D checked today but not fasting. Will RTC tomorrow.  Orders Placed This Encounter  Procedures  . DG Bone Density    Order Specific Question:  Reason for Exam (SYMPTOM  OR DIAGNOSIS REQUIRED)    Answer:  osteopenia - low bone mass and post menopausal    Order Specific  Question:  Preferred imaging location?    Answer:  Internal  . Ambulatory referral to Audiology    Referral Priority:  Routine    Referral Type:  Audiology Exam    Referral Reason:  Specialty Services Required    Number of Visits Requested:  1       Patient Instructions (the written plan) were given to the patient.   Cherre Robins, [facility]   10/07/2014

## 2014-10-07 NOTE — Addendum Note (Signed)
Addended by: Cherre Robins on: 10/07/2014 02:45 PM   Modules accepted: Orders

## 2014-10-07 NOTE — Patient Instructions (Addendum)
Carol [name] , Thank you for taking time to come for your Medicare Wellness Visit. I appreciate your ongoing commitment to your health goals. Please review the following plan we discussed and let me know if I can assist you in the future.    Continue with Weight Watchers - great weight loss over the last 2 months Continue with walking    This is a list of the screening recommended for you and due dates:  Health Maintenance  Topic Date Due  .  Hepatitis C: One time screening is recommended by Center for Disease Control  (CDC) for  adults born from [birth year] through 1967.   02/05/1946  . DEXA scan (bone density measurement)  08/10/20118  . Flu Shot  09/28/2014  . Pneumonia vaccines (1 of 2 - PCV13) Due now  . Mammogram  12/12/2014  . Tetanus Vaccine  02/28/2016  . Colon Cancer Screening  05/24/2021  . Shingles Vaccine  Completed  *Topic was postponed. The date shown is not the original due date.    Fall Prevention and Home Safety Falls cause injuries and can affect all age groups. It is possible to use preventive measures to significantly decrease the likelihood of falls. There are many simple measures which can make your home safer and prevent falls. OUTDOORS  Repair cracks and edges of walkways and driveways.  Remove high doorway thresholds.  Trim shrubbery on the main path into your home.  Have good outside lighting.  Clear walkways of tools, rocks, debris, and clutter.  Check that handrails are not broken and are securely fastened. Both sides of steps should have handrails.  Have leaves, snow, and ice cleared regularly.  Use sand or salt on walkways during winter months.  In the garage, clean up grease or oil spills. BATHROOM  Install night lights.  Install grab bars by the toilet and in the tub and shower.  Use non-skid mats or decals in the tub or shower.  Place a plastic non-slip stool in the shower to sit on, if needed.  Keep floors dry and clean up all water on  the floor immediately.  Remove soap buildup in the tub or shower on a regular basis.  Secure bath mats with non-slip, double-sided rug tape.  Remove throw rugs and tripping hazards from the floors. BEDROOMS  Install night lights.  Make sure a bedside light is easy to reach.  Do not use oversized bedding.  Keep a telephone by your bedside.  Have a firm chair with side arms to use for getting dressed.  Remove throw rugs and tripping hazards from the floor. KITCHEN  Keep handles on pots and pans turned toward the center of the stove. Use back burners when possible.  Clean up spills quickly and allow time for drying.  Avoid walking on wet floors.  Avoid hot utensils and knives.  Position shelves so they are not too high or low.  Place commonly used objects within easy reach.  If necessary, use a sturdy step stool with a grab bar when reaching.  Keep electrical cables out of the way.  Do not use floor polish or wax that makes floors slippery. If you must use wax, use non-skid floor wax.  Remove throw rugs and tripping hazards from the floor. STAIRWAYS  Never leave objects on stairs.  Place handrails on both sides of stairways and use them. Fix any loose handrails. Make sure handrails on both sides of the stairways are as long as the stairs.  Check carpeting  to make sure it is firmly attached along stairs. Make repairs to worn or loose carpet promptly.  Avoid placing throw rugs at the top or bottom of stairways, or properly secure the rug with carpet tape to prevent slippage. Get rid of throw rugs, if possible.  Have an electrician put in a light switch at the top and bottom of the stairs. OTHER FALL PREVENTION TIPS  Wear low-heel or rubber-soled shoes that are supportive and fit well. Wear closed toe shoes.  When using a stepladder, make sure it is fully opened and both spreaders are firmly locked. Do not climb a closed stepladder.  Add color or contrast paint or  tape to grab bars and handrails in your home. Place contrasting color strips on first and last steps.  Learn and use mobility aids as needed. Install an electrical emergency response system.  Turn on lights to avoid dark areas. Replace light bulbs that burn out immediately. Get light switches that glow.  Arrange furniture to create clear pathways. Keep furniture in the same place.  Firmly attach carpet with non-skid or double-sided tape.  Eliminate uneven floor surfaces.  Select a carpet pattern that does not visually hide the edge of steps.  Be aware of all pets. OTHER HOME SAFETY TIPS  Set the water temperature for 120 F (48.8 C).  Keep emergency numbers on or near the telephone.  Keep smoke detectors on every level of the home and near sleeping areas. Document Released: 02/03/2002 Document Revised: 08/15/2011 Document Reviewed: 05/05/2011 [facility] Patient Information 2015 [place], [place]. This information is not intended to replace advice given to you by your health care provider. Make sure you discuss any questions you have with your health care provider.   Calcium & Vitamin D: The Facts  Why is calcium and vitamin D consumption important? Calcium: . Most Americans do not consume adequate amounts of calcium! Calcium is required for proper muscle function, nerve communication, bone support, and many other functions in the body.  . The body uses bones as a source of calcium. Bones 'remodel' themselves continuously - the body constantly breaks bone down to release calcium and rebuilds bones by replacing calcium in the bone later.  . As we get older, the rate of bone breakdown occurs faster than bone rebuilding which could lead to osteopenia, osteoporosis, and possible fractures.   Vitamin D: . People naturally make vitamin D in the body when sunlight hits the skin and triggers a process that leads to vitamin D production. This natural vitamin D production requires about 10-15  minutes of sun exposure on the hands, arms, and face at least 2-3 times per week. However, due to decreased sun exposure and the use of sunscreen, most people will need to get additional vitamin D from foods or supplements. Your doctor can measure your body's vitamin D level through a simple blood test to determine your daily vitamin D needs.  . Vitamin D is used to help the body absorb calcium, maintain bone health, help the immune system, and reduce inflammation. It also plays a role in muscle performance, balance and risk of falling.  . Vitamin D deficiency can lead to osteomalacia or softening of the bones, bone pain, and muscle weakness.   The recommended daily allowance of Calcium and Vitamin D varies for different age groups. Age group Calcium (mg) Vitamin D (IU)  Females and Males: Age 22-50 1000 mg 600 IU  Females: Age 31- 16 1200 mg 600 IU  Males: Age 33-70 1000  mg 600 IU  Females and Males: Age 10+ 1200 mg 800 IU  Pregnant/lactating Females age 51-50 1000 mg 600 IU   How much Calcium do you get in your diet? Calcium Intake # of servings per day  Total calcium (mg)  Skim milk, 2% milk (1 cup) _________ x 300 mg   Yogurt (1 small container) _________ x 200 mg   Cheese (1oz) _________ x 200 mg   Cottage Cheese (1 cup)             ________ x 150 mg   Almond milk (1 cup) _________ x 450 mg   Fortified Orange Juice (1 cup) _________ x 300 mg   Broccoli or spinach ( 1 cup) _________ x 100 mg   Salmon (3 oz) _________ x 150 mg    Almonds (1/4 cup) _______ x 90 mg      How do we get Calcium and Vitamin D in our diet? Calcium: . Obtaining calcium from the diet is the most preferred way to reach the recommended daily goal. If this goal is not reached through diet, calcium supplements are available.  . Calcium is found in many foods including: dairy products, dark leafy vegetables (like broccoli, kale, and spinach), fish, and fortified products like juices and cereals.  . The food label  will have a %DV (percent daily value) listed showing the amount of calcium per serving. To determine the total mg per serving, simply replace the % with zero (0).  For example, Almond Breeze almond milk contains 45% DV of calcium or 450mg  per 1 cup.  . You can increase the amount of calcium in your diet by using more calcium products in your daily meals. Use yogurt and fruit to make smoothies or use yogurt to top baked potatoes or make whipped potatoes. Sprinkle low fat cheese onto salads or into egg white omelets. You can even add non-fat dry milk powder (300mg  calcium per 1/3 cup) to hot cereals, meat loaf, soups, or potatoes.  . Calcium supplements come in many forms including tablets, chewables, and gummies. Be sure to read the label to determine the correct number of tablets per serving and whether or not to take the supplement with food.  . Calcium carbonate products (Oscal, Caltrate, and Viactiv) are generally better absorbed when taken with food while calcium citrate products like Citracal can be taken with or without food.  . The body can only absorb about 600 mg of calcium at one time. It is recommended to take calcium supplements in small amounts several times per day.  However, taking it all at once is better than not taking it at all. . Increasing your intake of calcium is essential for bone health, but may also lead to some side effects like constipation, increased gas, bloating or abdominal cramping. To help reduce these side effects, start with 1 tablet per day and slowly increase your intake of the supplement to the recommended doses. It is also recommended that you drink plenty of water each day. Vitamin D: . Very few foods naturally contain vitamin D. However, it is found in saltwater fish (like tuna, salmon and mackerel), beef liver, egg yolks, cheese and vitamin D fortified foods (like yogurt, cereals, orange juice and milk) . The amount of vitamin D in each food or product is listed as  %DV on the product label. To determine the total amount of vitamin D per serving, drop the % sign and multiply the number by 4. For example, 1  cup of Almond Breeze almond milk contains 25% DV vitamin D or 100 IU per serving (25 x 4 =100). . Vitamin D is also found in multivitamins and supplements and may be listed as ergocalciferol (vitamin D2) or cholecalciferol (vitamin D3). Each of these forms of vitamin D are equivalent and the daily recommended intake will vary based on your age and the vitamin D levels in your body. Follow your doctor's recommendation for vitamin D intake.

## 2014-10-08 ENCOUNTER — Other Ambulatory Visit (INDEPENDENT_AMBULATORY_CARE_PROVIDER_SITE_OTHER): Payer: Medicare Other

## 2014-10-08 DIAGNOSIS — M8580 Other specified disorders of bone density and structure, unspecified site: Secondary | ICD-10-CM

## 2014-10-08 DIAGNOSIS — E785 Hyperlipidemia, unspecified: Secondary | ICD-10-CM

## 2014-10-08 DIAGNOSIS — R7989 Other specified abnormal findings of blood chemistry: Secondary | ICD-10-CM

## 2014-10-08 DIAGNOSIS — E559 Vitamin D deficiency, unspecified: Secondary | ICD-10-CM | POA: Diagnosis not present

## 2014-10-09 ENCOUNTER — Telehealth: Payer: Self-pay | Admitting: Family Medicine

## 2014-10-09 LAB — CMP14+EGFR
ALBUMIN: 4.3 g/dL (ref 3.5–4.8)
ALK PHOS: 51 IU/L (ref 39–117)
ALT: 17 IU/L (ref 0–32)
AST: 20 IU/L (ref 0–40)
Albumin/Globulin Ratio: 1.7 (ref 1.1–2.5)
BILIRUBIN TOTAL: 0.7 mg/dL (ref 0.0–1.2)
BUN / CREAT RATIO: 24 (ref 11–26)
BUN: 19 mg/dL (ref 8–27)
CO2: 24 mmol/L (ref 18–29)
Calcium: 9.2 mg/dL (ref 8.7–10.3)
Chloride: 105 mmol/L (ref 97–108)
Creatinine, Ser: 0.79 mg/dL (ref 0.57–1.00)
GFR calc non Af Amer: 76 mL/min/{1.73_m2} (ref >59)
GFR, EST AFRICAN AMERICAN: 87 mL/min/{1.73_m2} (ref >59)
Globulin, Total: 2.5 g/dL (ref 1.5–4.5)
Glucose: 102 mg/dL — ABNORMAL HIGH (ref 65–99)
Potassium: 4.7 mmol/L (ref 3.5–5.2)
Sodium: 145 mmol/L — ABNORMAL HIGH (ref 134–144)
Total Protein: 6.8 g/dL (ref 6.0–8.5)

## 2014-10-09 LAB — LDL CHOLESTEROL, DIRECT: LDL Direct: 93 mg/dL (ref 0–99)

## 2014-10-09 LAB — LIPID PANEL
Chol/HDL Ratio: 3.1 ratio units (ref 0.0–4.4)
Cholesterol, Total: 166 mg/dL (ref 100–199)
HDL: 53 mg/dL (ref >39)
LDL Calculated: 87 mg/dL (ref 0–99)
TRIGLYCERIDES: 128 mg/dL (ref 0–149)
VLDL CHOLESTEROL CAL: 26 mg/dL (ref 5–40)

## 2014-10-09 LAB — VITAMIN D 25 HYDROXY (VIT D DEFICIENCY, FRACTURES): Vit D, 25-Hydroxy: 57.7 ng/mL (ref 30.0–100.0)

## 2014-10-09 NOTE — Telephone Encounter (Signed)
Patient aware of results and aware that we are checking on the hepatitis c antibody

## 2014-10-13 LAB — HEPATITIS C ANTIBODY: Hep C Virus Ab: <0.1 s/co ratio (ref 0.0–0.9)

## 2014-10-13 LAB — SPECIMEN STATUS REPORT

## 2014-10-22 DIAGNOSIS — H903 Sensorineural hearing loss, bilateral: Secondary | ICD-10-CM | POA: Diagnosis not present

## 2014-12-02 ENCOUNTER — Other Ambulatory Visit: Payer: Self-pay | Admitting: Family Medicine

## 2014-12-02 DIAGNOSIS — Z1231 Encounter for screening mammogram for malignant neoplasm of breast: Secondary | ICD-10-CM

## 2014-12-04 ENCOUNTER — Encounter: Payer: Self-pay | Admitting: Family Medicine

## 2014-12-04 ENCOUNTER — Ambulatory Visit (INDEPENDENT_AMBULATORY_CARE_PROVIDER_SITE_OTHER): Payer: Medicare Other | Admitting: Family Medicine

## 2014-12-04 VITALS — BP 127/69 | HR 60 | Temp 97.6°F | Ht 65.75 in | Wt 161.0 lb

## 2014-12-04 DIAGNOSIS — Z0000 Encounter for general adult medical examination without abnormal findings: Secondary | ICD-10-CM

## 2014-12-04 DIAGNOSIS — E785 Hyperlipidemia, unspecified: Secondary | ICD-10-CM

## 2014-12-04 DIAGNOSIS — I6523 Occlusion and stenosis of bilateral carotid arteries: Secondary | ICD-10-CM | POA: Diagnosis not present

## 2014-12-04 DIAGNOSIS — Z23 Encounter for immunization: Secondary | ICD-10-CM | POA: Diagnosis not present

## 2014-12-04 DIAGNOSIS — R7989 Other specified abnormal findings of blood chemistry: Secondary | ICD-10-CM

## 2014-12-04 NOTE — Patient Instructions (Addendum)
Medicare Annual Wellness Visit  [facility] and the medical providers at [facility] strive to bring you the best medical care.  In doing so we not only want to address your current medical conditions and concerns but also to detect new conditions early and prevent illness, disease and health-related problems.    Medicare offers a yearly Wellness Visit which allows our clinical staff to assess your need for preventative services including immunizations, lifestyle education, counseling to decrease risk of preventable diseases and screening for fall risk and other medical concerns.    This visit is provided free of charge (no copay) for all Medicare recipients. The clinical pharmacists at [facility] have begun to conduct these Wellness Visits which will also include a thorough review of all your medications.    As you primary medical provider recommend that you make an appointment for your Annual Wellness Visit if you have not done so already this year.  You may set up this appointment before you leave today or you may call back [phone]) and schedule an appointment.  Please make sure when you call that you mention that you are scheduling your Annual Wellness Visit with the clinical pharmacist so that the appointment may be made for the proper length of time.     Continue current medications. Continue good therapeutic lifestyle changes which include good diet and exercise. Fall precautions discussed with patient. If an FOBT was given today- please return it to our front desk. If you are over 63 years old - you may need Prevnar 66 or the adult Pneumonia vaccine.  **Flu shots will be available soon--- please call and schedule a FLU-CLINIC appointment**  After your visit with [place] today you will receive a survey in the mail or online from [employer] regarding your care with [place]. Please take a moment to fill this out. Your feedback is  very important to [place] as you can help [place] better understand your patient needs as well as improve your experience and satisfaction. WE CARE ABOUT YOU!!!    Continue to exercise regularly and eat healthy Drink plenty water and fluids and stay well hydrated this winter Continue current medications

## 2014-12-04 NOTE — Progress Notes (Signed)
Subjective:    Patient ID: Carol [name], female    DOB: 22-Dec-1945, 69 y.o.   MRN: [id number]  HPI Pt here for follow up and management of chronic medical problems which includes hyperlipidemia. She is taking medications regularly. This patient saw the cardiologist back in May. He really emphasize to her that she needed high-dose statin therapy and that this is important for her to take this medication regularly because of the strong family history of heart disease. She also has bilateral carotid artery stenosis and he plans to get another Doppler one year from the May 2016 visit. The patient's recent lab work will be reviewed with her today. The patient exercises daily and regularly with her sister. She is positive she's gotten hearing aids recently and she is up-to-date on all of her health care parameters. The patient denies chest pain shortness of breath trouble swallowing and heartburn indigestion nausea vomiting diarrhea or blood in the stool. She is passing her water without problems.      Patient Active Problem List   Diagnosis Date Noted  . Carotid stenosis 07/22/2014  . Family history of heart disease 07/28/2013  . Need for prophylactic vaccination and inoculation against influenza 11/26/2012  . Diverticulosis   . Osteopenia 09/11/2012  . Hyperlipidemia LDL goal <70 07/23/2012  . H/O infectious disease 01/02/2012  . Pseudoptosis 11/07/2011   Outpatient Encounter Prescriptions as of 12/04/2014  Medication Sig  . aspirin EC 81 MG tablet Take 81 mg by mouth daily.  . Calcium 500-125 MG-UNIT TABS 1 tablet. 1 tablet daily.  . Multiple Vitamins tablet 1 tablet. 1 tablet daily.  . OMEGA-3 1000 MG CAPS 2 g daily  . rosuvastatin (CRESTOR) 40 MG tablet Take 40 mg by mouth daily.  . Vitamin D, Ergocalciferol, (DRISDOL) 50000 UNITS CAPS capsule Take 50,000 Units by mouth every 7 (seven) days.  . [DISCONTINUED] Cholecalciferol (PA VITAMIN D-3) 2000 UNITS CAPS 1 capsule. 1 capsule  daily.   No facility-administered encounter medications on file as of 12/04/2014.      Review of Systems  Constitutional: Negative.   HENT: Negative.   Eyes: Negative.   Respiratory: Negative.   Cardiovascular: Negative.   Gastrointestinal: Negative.   Endocrine: Negative.   Genitourinary: Negative.   Musculoskeletal: Negative.   Skin: Negative.   Allergic/Immunologic: Negative.   Neurological: Negative.   Hematological: Negative.   Psychiatric/Behavioral: Negative.        Objective:   Physical Exam  Constitutional: She is oriented to person, place, and time. She appears well-developed and well-nourished. No distress.  HENT:  Head: Normocephalic and atraumatic.  Right Ear: External ear normal.  Left Ear: External ear normal.  Nose: Nose normal.  Mouth/Throat: Oropharynx is clear and moist.  Eyes: Conjunctivae and EOM are normal. Pupils are equal, round, and reactive to light. Right eye exhibits no discharge. Left eye exhibits no discharge. No scleral icterus.  Neck: Normal range of motion. Neck supple. No JVD present. No thyromegaly present.  No audible carotid bruits were auscultated today  Cardiovascular: Normal rate, regular rhythm, normal heart sounds and intact distal pulses.   No murmur heard. At 60/m  Pulmonary/Chest: Effort normal and breath sounds normal. No respiratory distress. She has no wheezes. She has no rales. She exhibits no tenderness.  Abdominal: Soft. Bowel sounds are normal. She exhibits no mass. There is no tenderness. There is no rebound and no guarding.  No abdominal tenderness or organ enlargement masses or bruits.  Musculoskeletal: Normal range of motion.  She exhibits no edema.  Lymphadenopathy:    She has no cervical adenopathy.  Neurological: She is alert and oriented to person, place, and time. She has normal reflexes. No cranial nerve deficit.  Skin: Skin is warm and dry. No rash noted.  Psychiatric: She has a normal mood and affect. Her  behavior is normal. Judgment and thought content normal.  Nursing note and vitals reviewed.   BP 127/69 mmHg  Pulse 60  Temp(Src) 97.6 F (36.4 C) (Oral)  Ht 5' 5.75" (1.67 m)  Wt 161 lb (73.029 kg)  BMI 26.19 kg/m2       Assessment & Plan:  1. Hyperlipidemia LDL goal <70 -The patient's recent cholesterol numbers were very good and she will continue with current treatment  2. Low serum vitamin D - the patient is now off of the vitamin D 50,000 units and will continue with the 1000 units daily until she has her next vitamin D level  3. Health care maintenance -She will get her flu shot today  4. Carotid stenosis, bilateral -She will follow-up with the cardiologist and get her next Dopplers of her carotid next spring as planned  Patient Instructions                       Medicare Annual Wellness Visit  [facility] and the medical providers at [facility] strive to bring you the best medical care.  In doing so we not only want to address your current medical conditions and concerns but also to detect new conditions early and prevent illness, disease and health-related problems.    Medicare offers a yearly Wellness Visit which allows our clinical staff to assess your need for preventative services including immunizations, lifestyle education, counseling to decrease risk of preventable diseases and screening for fall risk and other medical concerns.    This visit is provided free of charge (no copay) for all Medicare recipients. The clinical pharmacists at [facility] have begun to conduct these Wellness Visits which will also include a thorough review of all your medications.    As you primary medical provider recommend that you make an appointment for your Annual Wellness Visit if you have not done so already this year.  You may set up this appointment before you leave today or you may call back [phone]) and schedule an appointment.   Please make sure when you call that you mention that you are scheduling your Annual Wellness Visit with the clinical pharmacist so that the appointment may be made for the proper length of time.     Continue current medications. Continue good therapeutic lifestyle changes which include good diet and exercise. Fall precautions discussed with patient. If an FOBT was given today- please return it to our front desk. If you are over 3 years old - you may need Prevnar 73 or the adult Pneumonia vaccine.  **Flu shots will be available soon--- please call and schedule a FLU-CLINIC appointment**  After your visit with [place] today you will receive a survey in the mail or online from [employer] regarding your care with [place]. Please take a moment to fill this out. Your feedback is very important to [place] as you can help [place] better understand your patient needs as well as improve your experience and satisfaction. WE CARE ABOUT YOU!!!    Continue to exercise regularly and eat healthy Drink plenty water and fluids and stay well hydrated this winter Continue current medications  Arrie Senate MD

## 2014-12-14 ENCOUNTER — Ambulatory Visit (HOSPITAL_COMMUNITY)
Admission: RE | Admit: 2014-12-14 | Discharge: 2014-12-14 | Disposition: A | Discharge status: Home / Self Care | Payer: Medicare Other | Source: Ambulatory Visit | Attending: Family Medicine | Admitting: Family Medicine

## 2014-12-14 DIAGNOSIS — Z1231 Encounter for screening mammogram for malignant neoplasm of breast: Secondary | ICD-10-CM | POA: Diagnosis not present

## 2015-02-09 DIAGNOSIS — D239 Other benign neoplasm of skin, unspecified: Secondary | ICD-10-CM | POA: Diagnosis not present

## 2015-02-09 DIAGNOSIS — L739 Follicular disorder, unspecified: Secondary | ICD-10-CM | POA: Diagnosis not present

## 2015-03-05 ENCOUNTER — Encounter: Payer: Self-pay | Admitting: Family Medicine

## 2015-03-05 ENCOUNTER — Ambulatory Visit (INDEPENDENT_AMBULATORY_CARE_PROVIDER_SITE_OTHER): Payer: Medicare Other | Admitting: Family Medicine

## 2015-03-05 VITALS — BP 120/60 | HR 63 | Temp 98.7°F | Ht 65.75 in | Wt 167.0 lb

## 2015-03-05 DIAGNOSIS — H6501 Acute serous otitis media, right ear: Secondary | ICD-10-CM

## 2015-03-05 DIAGNOSIS — J029 Acute pharyngitis, unspecified: Secondary | ICD-10-CM

## 2015-03-05 LAB — POCT RAPID STREP A (OFFICE): RAPID STREP A SCREEN: NEGATIVE

## 2015-03-05 LAB — POCT INFLUENZA A/B
Influenza A, POC: NEGATIVE
Influenza B, POC: NEGATIVE

## 2015-03-05 MED ORDER — AZITHROMYCIN 250 MG PO TABS: ORAL_TABLET | ORAL | Status: DC

## 2015-03-05 NOTE — Progress Notes (Signed)
Subjective:  Patient ID: Carol [name], female    DOB: 01/20/1946  Age: 70 y.o. MRN: [id number]  CC: Sore Throat No cough.  HPI Carol [name] presents for Exposed to illness on a jet last week. Onset now two days ago with ST.  Denies fever, chills. Congested. Right earstopped up.   History Carol [name] has a past medical history of Hyperlipidemia; Osteopenia; Diverticulosis; Carotid artery stenosis; Precancerous skin lesion (2016); and Cataract.   Carol [name] has past surgical history that includes Eye surgery.   Carol [name] family history includes CAD in Carol [name] brother; Cancer in Carol [name] sister, sister, and sister; Diabetes in Carol [name] sister; Heart disease in Carol [name] brother, brother, father, and mother; Hyperlipidemia in Carol [name] sister; Hypertension in Carol [name] brother; Kidney disease in Carol [name] brother and sister.Carol [name] reports that Carol [name] has never smoked. Carol [name] has never used smokeless tobacco. Carol [name] reports that Carol [name] does not drink alcohol or use illicit drugs.  Outpatient Prescriptions Prior to Visit  Medication Sig Dispense Refill  . aspirin EC 81 MG tablet Take 81 mg by mouth daily.    . Calcium 500-125 MG-UNIT TABS 1 tablet. 1 tablet daily.    . Multiple Vitamins tablet 1 tablet. 1 tablet daily.    . OMEGA-3 1000 MG CAPS 2 g daily    . rosuvastatin (CRESTOR) 40 MG tablet Take 40 mg by mouth daily.    . Vitamin D, Ergocalciferol, (DRISDOL) 50000 UNITS CAPS capsule Take 50,000 Units by mouth every 7 (seven) days.     No facility-administered medications prior to visit.    ROS Review of Systems  Constitutional: Negative for fever, chills, activity change and appetite change.  HENT: Positive for congestion, ear pain and rhinorrhea. Negative for ear discharge, hearing loss, nosebleeds, postnasal drip, sinus pressure, sneezing and trouble swallowing.   Respiratory: Negative for chest tightness and shortness of breath.   Cardiovascular: Negative for chest pain and palpitations.  Skin: Negative for rash.    Objective:  BP 120/60  mmHg  Pulse 63  Temp(Src) 98.7 F (37.1 C) (Oral)  Ht 5' 5.75" (1.67 m)  Wt 167 lb (75.751 kg)  BMI 27.16 kg/m2  BP Readings from Last 3 Encounters:  03/05/15 120/60  12/04/14 127/69  10/07/14 138/80    Wt Readings from Last 3 Encounters:  03/05/15 167 lb (75.751 kg)  12/04/14 161 lb (73.029 kg)  10/07/14 168 lb (76.204 kg)     Physical Exam  Constitutional: Carol [name] appears well-developed and well-nourished.  HENT:  Head: Normocephalic and atraumatic.  Right Ear: Tympanic membrane and external ear normal. No decreased hearing is noted.  Left Ear: Tympanic membrane and external ear normal. No decreased hearing is noted.  Nose: Mucosal edema present. Right sinus exhibits no frontal sinus tenderness. Left sinus exhibits no frontal sinus tenderness.  Mouth/Throat: No oropharyngeal exudate or posterior oropharyngeal erythema.  Neck: No Brudzinski's sign noted.  Pulmonary/Chest: Breath sounds normal. No respiratory distress.  Lymphadenopathy:       Head (right side): No preauricular adenopathy present.       Head (left side): No preauricular adenopathy present.       Right cervical: No superficial cervical adenopathy present.      Left cervical: No superficial cervical adenopathy present.     Lab Results  Component Value Date   WBC 6.5 07/21/2014   HGB 12.8 07/21/2014   HCT 42.9 07/21/2014   PLT 180 09/13/2010   GLUCOSE 102* 10/08/2014   CHOL 166 10/08/2014   TRIG 128 10/08/2014   HDL  53 10/08/2014   LDLDIRECT 93 10/08/2014   LDLCALC 87 10/08/2014   ALT 17 10/08/2014   AST 20 10/08/2014   NA 145* 10/08/2014   K 4.7 10/08/2014   CL 105 10/08/2014   CREATININE 0.79 10/08/2014   BUN 19 10/08/2014   CO2 24 10/08/2014    Mm Screening Breast Tomo Bilateral  12/15/2014  CLINICAL DATA:  Screening. EXAM: DIGITAL SCREENING BILATERAL MAMMOGRAM WITH 3D TOMO WITH CAD COMPARISON:  Previous exam(s). ACR Breast Density Category b: There are scattered areas of fibroglandular  density. FINDINGS: There are no findings suspicious for malignancy. Images were processed with CAD. IMPRESSION: No mammographic evidence of malignancy. A result letter of this screening mammogram will be mailed directly to the patient. RECOMMENDATION: Screening mammogram in one year. (Code:SM-B-01Y) BI-RADS CATEGORY  1: Negative. Electronically Signed   By: Margarette Canada M.D.   On: 12/15/2014 15:25    Assessment & Plan:   Carol [name] was seen today for sore throat.  Diagnoses and all orders for this visit:  Sore throat -     POCT Influenza A/B -     POCT rapid strep A  Right acute serous otitis media, recurrence not specified  Other orders -     azithromycin (ZITHROMAX Z-PAK) 250 MG tablet; Take two right away Then one a day for the next 4 days.   I have discontinued Carol [name]'s Vitamin D (Ergocalciferol). I am also having Carol [name] start on azithromycin. Additionally, I am having Carol [name] maintain Carol [name] Calcium, Multiple Vitamins, Omega-3, aspirin EC, rosuvastatin, and Vitamin D3.  Meds ordered this encounter  Medications  . Cholecalciferol (VITAMIN D3) 2000 units capsule    Sig: 1 capsule.  Marland Kitchen azithromycin (ZITHROMAX Z-PAK) 250 MG tablet    Sig: Take two right away Then one a day for the next 4 days.    Dispense:  6 each    Refill:  0     Follow-up: Return if symptoms worsen or fail to improve.  Claretta Fraise, M.D.

## 2015-05-25 ENCOUNTER — Telehealth: Payer: Self-pay | Admitting: Family Medicine

## 2015-05-25 DIAGNOSIS — E785 Hyperlipidemia, unspecified: Secondary | ICD-10-CM

## 2015-05-25 DIAGNOSIS — Z23 Encounter for immunization: Secondary | ICD-10-CM

## 2015-05-25 DIAGNOSIS — E559 Vitamin D deficiency, unspecified: Secondary | ICD-10-CM

## 2015-05-25 DIAGNOSIS — I6523 Occlusion and stenosis of bilateral carotid arteries: Secondary | ICD-10-CM

## 2015-05-25 NOTE — Telephone Encounter (Signed)
Detailed message left for patient that lab orders have been placed.

## 2015-05-27 ENCOUNTER — Other Ambulatory Visit: Payer: Medicare Other

## 2015-05-27 DIAGNOSIS — I6523 Occlusion and stenosis of bilateral carotid arteries: Secondary | ICD-10-CM | POA: Diagnosis not present

## 2015-05-27 DIAGNOSIS — E785 Hyperlipidemia, unspecified: Secondary | ICD-10-CM

## 2015-05-28 LAB — BMP8+EGFR
BUN / CREAT RATIO: 18 (ref 11–26)
BUN: 15 mg/dL (ref 8–27)
CO2: 25 mmol/L (ref 18–29)
CREATININE: 0.84 mg/dL (ref 0.57–1.00)
Calcium: 9.4 mg/dL (ref 8.7–10.3)
Chloride: 102 mmol/L (ref 96–106)
GFR calc Af Amer: 81 mL/min/{1.73_m2} (ref >59)
GFR, EST NON AFRICAN AMERICAN: 70 mL/min/{1.73_m2} (ref >59)
GLUCOSE: 97 mg/dL (ref 65–99)
Potassium: 4.5 mmol/L (ref 3.5–5.2)
SODIUM: 146 mmol/L — AB (ref 134–144)

## 2015-05-28 LAB — CBC WITH DIFFERENTIAL/PLATELET
BASOS: 0 %
Basophils Absolute: 0 10*3/uL (ref 0.0–0.2)
EOS (ABSOLUTE): 0.3 10*3/uL (ref 0.0–0.4)
EOS: 6 %
Hematocrit: 41.3 % (ref 34.0–46.6)
Hemoglobin: 13.5 g/dL (ref 11.1–15.9)
IMMATURE GRANS (ABS): 0 10*3/uL (ref 0.0–0.1)
IMMATURE GRANULOCYTES: 0 %
LYMPHS: 42 %
Lymphocytes Absolute: 2.1 10*3/uL (ref 0.7–3.1)
MCH: 30.1 pg (ref 26.6–33.0)
MCHC: 32.7 g/dL (ref 31.5–35.7)
MCV: 92 fL (ref 79–97)
MONOCYTES: 6 %
Monocytes Absolute: 0.3 10*3/uL (ref 0.1–0.9)
NEUTROS PCT: 46 %
Neutrophils Absolute: 2.4 10*3/uL (ref 1.4–7.0)
PLATELETS: 208 10*3/uL (ref 150–379)
RBC: 4.49 x10E6/uL (ref 3.77–5.28)
RDW: 13.7 % (ref 12.3–15.4)
WBC: 5.1 10*3/uL (ref 3.4–10.8)

## 2015-05-28 LAB — HEPATIC FUNCTION PANEL
ALT: 17 IU/L (ref 0–32)
AST: 20 IU/L (ref 0–40)
Albumin: 4.4 g/dL (ref 3.5–4.8)
Alkaline Phosphatase: 57 IU/L (ref 39–117)
Bilirubin Total: 0.7 mg/dL (ref 0.0–1.2)
Bilirubin, Direct: 0.19 mg/dL (ref 0.00–0.40)
TOTAL PROTEIN: 7 g/dL (ref 6.0–8.5)

## 2015-05-28 LAB — LIPID PANEL
Chol/HDL Ratio: 3.3 ratio units (ref 0.0–4.4)
Cholesterol, Total: 174 mg/dL (ref 100–199)
HDL: 52 mg/dL (ref >39)
LDL Calculated: 82 mg/dL (ref 0–99)
Triglycerides: 199 mg/dL — ABNORMAL HIGH (ref 0–149)
VLDL CHOLESTEROL CAL: 40 mg/dL (ref 5–40)

## 2015-06-04 ENCOUNTER — Encounter: Payer: Self-pay | Admitting: Family Medicine

## 2015-06-04 ENCOUNTER — Encounter: Payer: Self-pay | Admitting: *Deleted

## 2015-06-04 ENCOUNTER — Ambulatory Visit (INDEPENDENT_AMBULATORY_CARE_PROVIDER_SITE_OTHER): Payer: Medicare Other | Admitting: Family Medicine

## 2015-06-04 VITALS — BP 128/52 | HR 55 | Temp 97.3°F | Ht 65.75 in | Wt 167.0 lb

## 2015-06-04 DIAGNOSIS — I6523 Occlusion and stenosis of bilateral carotid arteries: Secondary | ICD-10-CM

## 2015-06-04 DIAGNOSIS — Z1211 Encounter for screening for malignant neoplasm of colon: Secondary | ICD-10-CM

## 2015-06-04 DIAGNOSIS — E559 Vitamin D deficiency, unspecified: Secondary | ICD-10-CM | POA: Diagnosis not present

## 2015-06-04 DIAGNOSIS — E785 Hyperlipidemia, unspecified: Secondary | ICD-10-CM | POA: Diagnosis not present

## 2015-06-04 DIAGNOSIS — Z0000 Encounter for general adult medical examination without abnormal findings: Secondary | ICD-10-CM

## 2015-06-04 MED ORDER — FENOFIBRATE 145 MG PO TABS: 145.0000 mg | ORAL_TABLET | Freq: Every day | ORAL | Status: DC

## 2015-06-04 MED ORDER — ROSUVASTATIN CALCIUM 40 MG PO TABS: 40.0000 mg | ORAL_TABLET | Freq: Every day | ORAL | Status: DC

## 2015-06-04 NOTE — Patient Instructions (Addendum)
Medicare Annual Wellness Visit  [facility] and the medical providers at [facility] strive to bring you the best medical care.  In doing so we not only want to address your current medical conditions and concerns but also to detect new conditions early and prevent illness, disease and health-related problems.    Medicare offers a yearly Wellness Visit which allows our clinical staff to assess your need for preventative services including immunizations, lifestyle education, counseling to decrease risk of preventable diseases and screening for fall risk and other medical concerns.    This visit is provided free of charge (no copay) for all Medicare recipients. The clinical pharmacists at [facility] have begun to conduct these Wellness Visits which will also include a thorough review of all your medications.    As you primary medical provider recommend that you make an appointment for your Annual Wellness Visit if you have not done so already this year.  You may set up this appointment before you leave today or you may call back [phone] and schedule an appointment.  Please make sure when you call that you mention that you are scheduling your Annual Wellness Visit with the clinical pharmacist so that the appointment may be made for the proper length of time.     Continue current medications. Continue good therapeutic lifestyle changes which include good diet and exercise. Fall precautions discussed with patient. If an FOBT was given today- please return it to our front desk. If you are over 49 years old - you may need Prevnar 82 or the adult Pneumonia vaccine.  **Flu shots are available--- please call and schedule a FLU-CLINIC appointment**  After your visit with [place] today you will receive a survey in the mail or online from [employer] regarding your care with [place]. Please take a moment to fill this out. Your feedback is very  important to [place] as you can help [place] better understand your patient needs as well as improve your experience and satisfaction. WE CARE ABOUT YOU!!!   The patient should restart her fenofibrate She should have a repeat lipid liver panel done in 6 weeks fasting. She should continue her aggressive therapeutic lifestyle changes which include diet and exercise She should be careful and do not put herself at risk for falling

## 2015-06-04 NOTE — Progress Notes (Signed)
Subjective:    Patient ID: Carol [name], female    DOB: August 22, 1945, 70 y.o.   MRN: [id number]  HPI Pt here for follow up and management of chronic medical problems which includes hyperlipidemia. She is taking medications regularly.The patient is doing well overall. She sees the cardiologist yearly because of her strong family history of heart disease and he has emphasized aggressive management of her cholesterol numbers. She also has carotid artery stenosis. She will have repeat Dopplers on her yearly visit to the cardiologist. She is asking for refill on the Crestor. Her weight today is stable. She is due to return an FOBT. She has had recent lab work done and this will be reviewed with her during the office and she will be given a copy of this for her records. She had a traditional cholesterol panel and the only abnormality was that her LDL C should be lower than 82 at less than 70. Her triglycerides were also elevated. The CBC was within normal limits. The blood sugar was good and the renal function and electrolytes were good. All liver function tests were normal. The patient notes that she stop her fenofibrate at the recommendation of one of our providers. Because of the elevated triglyceride she will restart this as she has more medication at home. We will recheck a lipid liver panel in 6 weeks fasting. She should follow-up with her cardiologist as planned and get the repeat carotid Dopplers as planned. She denies any chest pain shortness of breath trouble swallowing heartburn indigestion nausea vomiting diarrhea or blood in the stool. She is passing her water without problems. She is in a very positive mood and exercises regularly.      Patient Active Problem List   Diagnosis Date Noted  . Carotid stenosis 07/22/2014  . Family history of heart disease 07/28/2013  . Need for prophylactic vaccination and inoculation against influenza 11/26/2012  . Diverticulosis   . Osteopenia 09/11/2012    . Hyperlipidemia LDL goal <70 07/23/2012  . H/O infectious disease 01/02/2012  . Pseudoptosis 11/07/2011   Outpatient Encounter Prescriptions as of 06/04/2015  Medication Sig  . aspirin EC 81 MG tablet Take 81 mg by mouth daily.  . Calcium 500-125 MG-UNIT TABS 1 tablet. 1 tablet daily.  . Cholecalciferol (VITAMIN D3) 2000 units capsule 1 capsule.  . Multiple Vitamins tablet 1 tablet. 1 tablet daily.  . OMEGA-3 1000 MG CAPS 2 g daily  . rosuvastatin (CRESTOR) 40 MG tablet Take 40 mg by mouth daily.  . [DISCONTINUED] azithromycin (ZITHROMAX Z-PAK) 250 MG tablet Take two right away Then one a day for the next 4 days.   No facility-administered encounter medications on file as of 06/04/2015.     Review of Systems  Constitutional: Negative.   HENT: Negative.   Eyes: Negative.   Respiratory: Negative.   Cardiovascular: Negative.   Gastrointestinal: Negative.   Endocrine: Negative.   Genitourinary: Negative.   Musculoskeletal: Negative.   Skin: Negative.   Allergic/Immunologic: Negative.   Neurological: Negative.   Hematological: Negative.   Psychiatric/Behavioral: Negative.        Objective:   Physical Exam  Constitutional: She is oriented to person, place, and time. She appears well-developed and well-nourished. No distress.  HENT:  Head: Normocephalic and atraumatic.  Right Ear: External ear normal.  Left Ear: External ear normal.  Nose: Nose normal.  Mouth/Throat: Oropharynx is clear and moist.  Eyes: Conjunctivae and EOM are normal. Pupils are equal, round, and reactive to light.  Right eye exhibits no discharge. Left eye exhibits no discharge. No scleral icterus.  Neck: Normal range of motion. Neck supple. No thyromegaly present.  No bruits were auscultated on the exam today.  Cardiovascular: Normal rate, regular rhythm, normal heart sounds and intact distal pulses.   No murmur heard. The heart was regular at 60/m  Pulmonary/Chest: Effort normal and breath sounds normal.  No respiratory distress. She has no wheezes. She has no rales. She exhibits no tenderness.  Clear anteriorly and posteriorly  Abdominal: Soft. Bowel sounds are normal. She exhibits no mass. There is no tenderness. There is no rebound and no guarding.  No liver or spleen enlargement and no abdominal bruits and no epigastric or suprapubic tenderness  Musculoskeletal: Normal range of motion. She exhibits no edema or tenderness.  Lymphadenopathy:    She has no cervical adenopathy.  Neurological: She is alert and oriented to person, place, and time. She has normal reflexes. No cranial nerve deficit.  Skin: Skin is warm and dry. No rash noted.  Psychiatric: She has a normal mood and affect. Her behavior is normal. Judgment and thought content normal.  Nursing note and vitals reviewed.   BP 128/52 mmHg  Pulse 55  Temp(Src) 97.3 F (36.3 C) (Oral)  Ht 5' 5.75" (1.67 m)  Wt 167 lb (75.751 kg)  BMI 27.16 kg/m2       Assessment & Plan:  1. Hyperlipidemia LDL goal <70 -She had not been taking her TriCor. We will add this back to her Crestor and she will have a lipid liver panel repeated in 6 weeks fasting. - fenofibrate (TRICOR) 145 MG tablet; Take 1 tablet (145 mg total) by mouth daily.  Dispense: 90 tablet; Refill: 3 - Lipid panel; Future - Hepatic function panel; Future  2. Vitamin D deficiency -Continue current vitamin D replacement - VITAMIN D 25 Hydroxy (Vit-D Deficiency, Fractures); Future  3. Carotid stenosis, bilateral -Get carotid Dopplers as planned by the cardiologist  4. Health care maintenance -Return the FOBT  5. Special screening for malignant neoplasms, colon -Always keep a check for blood in the stool and urine make [place] aware of any signs of this. - Fecal occult blood, imunochemical; Future  Meds ordered this encounter  Medications  . rosuvastatin (CRESTOR) 40 MG tablet    Sig: Take 1 tablet (40 mg total) by mouth daily.    Dispense:  90 tablet    Refill:  3  .  fenofibrate (TRICOR) 145 MG tablet    Sig: Take 1 tablet (145 mg total) by mouth daily.    Dispense:  90 tablet    Refill:  3   Patient Instructions                       Medicare Annual Wellness Visit  [facility] and the medical providers at [facility] strive to bring you the best medical care.  In doing so we not only want to address your current medical conditions and concerns but also to detect new conditions early and prevent illness, disease and health-related problems.    Medicare offers a yearly Wellness Visit which allows our clinical staff to assess your need for preventative services including immunizations, lifestyle education, counseling to decrease risk of preventable diseases and screening for fall risk and other medical concerns.    This visit is provided free of charge (no copay) for all Medicare recipients. The clinical pharmacists at [facility] have begun to conduct  these Wellness Visits which will also include a thorough review of all your medications.    As you primary medical provider recommend that you make an appointment for your Annual Wellness Visit if you have not done so already this year.  You may set up this appointment before you leave today or you may call back [phone] and schedule an appointment.  Please make sure when you call that you mention that you are scheduling your Annual Wellness Visit with the clinical pharmacist so that the appointment may be made for the proper length of time.     Continue current medications. Continue good therapeutic lifestyle changes which include good diet and exercise. Fall precautions discussed with patient. If an FOBT was given today- please return it to our front desk. If you are over 25 years old - you may need Prevnar 70 or the adult Pneumonia vaccine.  **Flu shots are available--- please call and schedule a FLU-CLINIC appointment**  After your visit with [place] today you  will receive a survey in the mail or online from [employer] regarding your care with [place]. Please take a moment to fill this out. Your feedback is very important to [place] as you can help [place] better understand your patient needs as well as improve your experience and satisfaction. WE CARE ABOUT YOU!!!   The patient should restart her fenofibrate She should have a repeat lipid liver panel done in 6 weeks fasting. She should continue her aggressive therapeutic lifestyle changes which include diet and exercise She should be careful and do not put herself at risk for falling   Arrie Senate MD

## 2015-06-08 ENCOUNTER — Other Ambulatory Visit: Payer: Medicare Other

## 2015-06-08 DIAGNOSIS — Z1211 Encounter for screening for malignant neoplasm of colon: Secondary | ICD-10-CM | POA: Diagnosis not present

## 2015-06-09 ENCOUNTER — Ambulatory Visit (HOSPITAL_COMMUNITY)
Admission: RE | Admit: 2015-06-09 | Discharge: 2015-06-09 | Disposition: A | Discharge status: Home / Self Care | Payer: Medicare Other | Source: Ambulatory Visit | Attending: Cardiology | Admitting: Cardiology

## 2015-06-09 DIAGNOSIS — I6529 Occlusion and stenosis of unspecified carotid artery: Secondary | ICD-10-CM | POA: Diagnosis present

## 2015-06-09 DIAGNOSIS — E785 Hyperlipidemia, unspecified: Secondary | ICD-10-CM | POA: Diagnosis not present

## 2015-06-09 DIAGNOSIS — I6523 Occlusion and stenosis of bilateral carotid arteries: Secondary | ICD-10-CM | POA: Diagnosis not present

## 2015-06-12 LAB — FECAL OCCULT BLOOD, IMMUNOCHEMICAL: Fecal Occult Bld: NEGATIVE

## 2015-06-14 ENCOUNTER — Other Ambulatory Visit: Payer: Self-pay | Admitting: *Deleted

## 2015-06-14 DIAGNOSIS — I739 Peripheral vascular disease, unspecified: Principal | ICD-10-CM

## 2015-06-14 DIAGNOSIS — I779 Disorder of arteries and arterioles, unspecified: Secondary | ICD-10-CM

## 2015-07-01 ENCOUNTER — Other Ambulatory Visit: Payer: Self-pay | Admitting: Internal Medicine

## 2015-07-01 NOTE — Telephone Encounter (Signed)
Rx(s) sent to pharmacy electronically.  

## 2015-07-27 ENCOUNTER — Encounter: Payer: Self-pay | Admitting: Internal Medicine

## 2015-07-27 ENCOUNTER — Ambulatory Visit (INDEPENDENT_AMBULATORY_CARE_PROVIDER_SITE_OTHER): Payer: Medicare Other | Admitting: Internal Medicine

## 2015-07-27 VITALS — BP 140/62 | HR 58 | Ht 60.0 in | Wt 171.0 lb

## 2015-07-27 DIAGNOSIS — E785 Hyperlipidemia, unspecified: Secondary | ICD-10-CM | POA: Diagnosis not present

## 2015-07-27 DIAGNOSIS — Z8249 Family history of ischemic heart disease and other diseases of the circulatory system: Secondary | ICD-10-CM

## 2015-07-27 DIAGNOSIS — I6523 Occlusion and stenosis of bilateral carotid arteries: Secondary | ICD-10-CM

## 2015-07-27 NOTE — Progress Notes (Signed)
OFFICE NOTE  Chief Complaint:  Routine follow-up  Primary Care Physician: Redge Gainer, MD  HPI:  Carol [name] is a pleasant 70 year old female kindly referred to me by Dr. Laurance Flatten for cardiac evaluation. Recently she underwent lifeline screening was found to have carotid stenosis. She had a formal carotid Doppler performed at [facility] which demonstrated mild to moderate bilateral carotid artery disease. She's been adequately managed in fact is on no Vytorin and 5 fenofibrate for elevated cholesterol. Recent labs indicated an LDL-P of 1143, LDL 95, HDL 68 and triglycerides 68. Generally this is good control however given the fact that she has significant carotid artery disease, her targets for cholesterol management are much lower. We should strive for a LDL cholesterol less than 70. She's a symptomatically regards to chest pain or shortness of breath. Her brother recently died with heart attack at age 72 and of course she is appropriately concerned about that. She also has history of murmur which is been followed.  07/27/2015  Carol [name] returns today for follow-up. She seems to be doing quite well. She is physically active and walks about 3 miles several times a week. She recently had repeat carotid Dopplers which show moderate right internal carotid artery stenosis and mild left internal carotid artery stenosis which is stable compared to her studies a year ago. She also had repeat lipid testing which shows very good control at this point although triglycerides were somewhat elevated. She was started on fenofibrate in addition to her Crestor, and seems to be tolerating this well over the past month. She denies any myalgias. She is on daily aspirin. She denies any chest pain or worsening shortness of breath.  PMHx:  Past Medical History  Diagnosis Date  . Hyperlipidemia   . Osteopenia   . Diverticulosis   . Carotid artery stenosis   . Precancerous skin lesion 2016  . Cataract      Past Surgical History  Procedure Laterality Date  . Eye surgery      FAMHx:  Family History  Problem Relation Age of Onset  . Heart disease Mother   . Heart disease Father   . Cancer Sister     breast  . Hyperlipidemia Sister   . Diabetes Sister   . Cancer Sister     bone  . Heart disease Brother   . CAD Brother   . Cancer Sister     breast  . Kidney disease Sister   . Heart disease Brother   . Hypertension Brother   . Kidney disease Brother     SOCHx:   reports that she has never smoked. She has never used smokeless tobacco. She reports that she does not drink alcohol or use illicit drugs.  ALLERGIES:  Allergies  Allergen Reactions  . Penicillins Hives  . Sulfa Antibiotics   . Sulfamethoxazole Itching    ROS: Pertinent items noted in HPI and remainder of comprehensive ROS otherwise negative.  HOME MEDS: Current Outpatient Prescriptions  Medication Sig Dispense Refill  . aspirin EC 81 MG tablet Take 81 mg by mouth daily.    . Calcium 500-125 MG-UNIT TABS 1 tablet. 1 tablet daily.    . Cholecalciferol (VITAMIN D3) 2000 units capsule 1 capsule.    . fenofibrate (TRICOR) 145 MG tablet Take 1 tablet (145 mg total) by mouth daily. 90 tablet 3  . Multiple Vitamins tablet 1 tablet. 1 tablet daily.    . OMEGA-3 1000 MG CAPS 2 g daily    .  rosuvastatin (CRESTOR) 40 MG tablet Take 1 tablet (40 mg total) by mouth daily. <PLEASE MAKE APPOINTMENT FOR REFILLS> 90 tablet 0   No current facility-administered medications for this visit.    LABS/IMAGING: No results found for this or any previous visit (from the past 48 hour(s)). No results found.  WEIGHTS: Wt Readings from Last 3 Encounters:  07/27/15 171 lb (77.565 kg)  06/04/15 167 lb (75.751 kg)  03/05/15 167 lb (75.751 kg)    VITALS: BP 140/62 mmHg  Pulse 58  Ht 5' (1.524 m)  Wt 171 lb (77.565 kg)  BMI 33.40 kg/m2  SpO2 97%  EXAM: General appearance: alert and no distress Neck: no carotid bruit and  no JVD Lungs: clear to auscultation bilaterally Heart: regular rate and rhythm, S1, S2 normal and systolic murmur: early systolic 2/6, blowing at apex Abdomen: soft, non-tender; bowel sounds normal; no masses,  no organomegaly Extremities: extremities normal, atraumatic, no cyanosis or edema Pulses: 2+ and symmetric Skin: Skin color, texture, turgor normal. No rashes or lesions Neurologic: Grossly normal Psych: Pleasant  EKG: Sinus bradycardia at 58  ASSESSMENT: 1. Bilateral carotid artery stenosis 2. Dyslipidemia 3. Family history of premature coronary artery disease  PLAN: 1.   Carol [name] is doing well today. Her carotid Doppler shows stable findings which we will repeat in one year. Her cholesterol is close to goal with elevated triglycerides however was recently placed on fenofibrate. This should make a significant improvement in her numbers. Overall she is doing well and I've encouraged her to continue to be active we'll plan to see her back in a year.  Pixie Casino, MD, [facility] Attending Cardiologist [place] 07/27/2015, 9:11 AM

## 2015-07-27 NOTE — Patient Instructions (Signed)
Your physician has requested that you have a carotid duplex in 1 year. This test is an ultrasound of the carotid arteries in your neck. It looks at blood flow through these arteries that supply the brain with blood. Allow one hour for this exam. There are no restrictions or special instructions.  Your physician wants you to follow-up in: 1 year with Dr. Debara Pickett. You will receive a reminder letter in the mail two months in advance. If you don't receive a letter, please call our office to schedule the follow-up appointment.

## 2015-08-19 ENCOUNTER — Telehealth: Payer: Self-pay | Admitting: Family Medicine

## 2015-08-19 DIAGNOSIS — E785 Hyperlipidemia, unspecified: Secondary | ICD-10-CM

## 2015-08-19 DIAGNOSIS — E559 Vitamin D deficiency, unspecified: Secondary | ICD-10-CM

## 2015-08-19 NOTE — Telephone Encounter (Signed)
Orders placed and patient aware.

## 2015-08-20 ENCOUNTER — Other Ambulatory Visit: Payer: Medicare Other

## 2015-08-20 DIAGNOSIS — E559 Vitamin D deficiency, unspecified: Secondary | ICD-10-CM | POA: Diagnosis not present

## 2015-08-20 DIAGNOSIS — E785 Hyperlipidemia, unspecified: Secondary | ICD-10-CM

## 2015-08-21 LAB — LIPID PANEL
CHOLESTEROL TOTAL: 176 mg/dL (ref 100–199)
Chol/HDL Ratio: 3.4 ratio units (ref 0.0–4.4)
HDL: 52 mg/dL (ref >39)
LDL CALC: 98 mg/dL (ref 0–99)
TRIGLYCERIDES: 130 mg/dL (ref 0–149)
VLDL CHOLESTEROL CAL: 26 mg/dL (ref 5–40)

## 2015-08-21 LAB — BMP8+EGFR
BUN/Creatinine Ratio: 19 (ref 12–28)
BUN: 16 mg/dL (ref 8–27)
CALCIUM: 8.9 mg/dL (ref 8.7–10.3)
CHLORIDE: 105 mmol/L (ref 96–106)
CO2: 24 mmol/L (ref 18–29)
CREATININE: 0.86 mg/dL (ref 0.57–1.00)
GFR calc Af Amer: 79 mL/min/{1.73_m2} (ref >59)
GFR calc non Af Amer: 68 mL/min/{1.73_m2} (ref >59)
GLUCOSE: 89 mg/dL (ref 65–99)
Potassium: 4.2 mmol/L (ref 3.5–5.2)
Sodium: 143 mmol/L (ref 134–144)

## 2015-08-21 LAB — HEPATIC FUNCTION PANEL
ALBUMIN: 4.1 g/dL (ref 3.5–4.8)
ALK PHOS: 55 IU/L (ref 39–117)
ALT: 16 IU/L (ref 0–32)
AST: 19 IU/L (ref 0–40)
BILIRUBIN TOTAL: 0.7 mg/dL (ref 0.0–1.2)
Bilirubin, Direct: 0.19 mg/dL (ref 0.00–0.40)
TOTAL PROTEIN: 6.6 g/dL (ref 6.0–8.5)

## 2015-08-21 LAB — VITAMIN D 25 HYDROXY (VIT D DEFICIENCY, FRACTURES): Vit D, 25-Hydroxy: 33.2 ng/mL (ref 30.0–100.0)

## 2015-09-08 DIAGNOSIS — H524 Presbyopia: Secondary | ICD-10-CM | POA: Diagnosis not present

## 2015-09-08 DIAGNOSIS — Z961 Presence of intraocular lens: Secondary | ICD-10-CM | POA: Diagnosis not present

## 2015-09-30 ENCOUNTER — Other Ambulatory Visit: Payer: Self-pay | Admitting: Internal Medicine

## 2015-10-19 DIAGNOSIS — S233XXA Sprain of ligaments of thoracic spine, initial encounter: Secondary | ICD-10-CM | POA: Diagnosis not present

## 2015-10-19 DIAGNOSIS — S134XXA Sprain of ligaments of cervical spine, initial encounter: Secondary | ICD-10-CM | POA: Diagnosis not present

## 2015-10-21 DIAGNOSIS — S134XXA Sprain of ligaments of cervical spine, initial encounter: Secondary | ICD-10-CM | POA: Diagnosis not present

## 2015-10-21 DIAGNOSIS — S233XXA Sprain of ligaments of thoracic spine, initial encounter: Secondary | ICD-10-CM | POA: Diagnosis not present

## 2015-10-22 DIAGNOSIS — S134XXA Sprain of ligaments of cervical spine, initial encounter: Secondary | ICD-10-CM | POA: Diagnosis not present

## 2015-10-22 DIAGNOSIS — S233XXA Sprain of ligaments of thoracic spine, initial encounter: Secondary | ICD-10-CM | POA: Diagnosis not present

## 2015-10-25 DIAGNOSIS — S134XXA Sprain of ligaments of cervical spine, initial encounter: Secondary | ICD-10-CM | POA: Diagnosis not present

## 2015-10-25 DIAGNOSIS — S233XXA Sprain of ligaments of thoracic spine, initial encounter: Secondary | ICD-10-CM | POA: Diagnosis not present

## 2015-11-03 ENCOUNTER — Telehealth: Payer: Self-pay | Admitting: Internal Medicine

## 2015-11-03 NOTE — Telephone Encounter (Signed)
Advice communicated to patient. She will call back in approx 1-1.5 weeks w status of symptoms and any remaining concerns.

## 2015-11-03 NOTE — Telephone Encounter (Signed)
Patient notes she recently has had swelling in legs. She's also had some tingling in left leg. C/o bilateral knee pain. Notes she's had this in the past but feels now this is different/worse.  She stopped crestor 4-5 days ago due to face and hand swelling. Also notes she had started tricor a few months ago and started to have back pain. stopped taking ~several weeks ago and resolved back concerns. She denies other symptoms.  Aware that crestor could be cause of some of her symptoms. I recommended she give it 2 weeks off this medication to see if these symptoms improve. Will see if Dr. Debara Pickett recommends OV or med changes. Pt agreeable to this plan.  Prefers to be reached by home phone today.

## 2015-11-03 NOTE — Telephone Encounter (Signed)
I'm happy to see her if those interventions do not help.  Dr. Lemmie Evens

## 2015-11-03 NOTE — Telephone Encounter (Signed)
Pt's legs and hands are swelling also tingling in her left leg.Please call to advise,wonder if she need to see Dr Debara Pickett or primary doctor.

## 2015-11-12 ENCOUNTER — Telehealth: Payer: Self-pay | Admitting: Internal Medicine

## 2015-11-12 NOTE — Telephone Encounter (Signed)
Recommendations communicated to patient, who voiced understanding and thanks. She will start BIW on Monday and Thursday. She notes she will call in October before she leaves on a short out of town trip so that she can update [place] on progress. Patient aware she may ask to speak to me.

## 2015-11-12 NOTE — Telephone Encounter (Signed)
She's on a high dose of rosuvastatin (40 mg).  Would she be willing to try 1/2 tablet just 2 days per week?  Her LDL is just under 100, so we might be able to get a drop with just 20 mg biw.

## 2015-11-12 NOTE — Telephone Encounter (Signed)
New message      Calling to give update since pt has been off cholesterol medication for approx 2 weeks----no numbness or tingling in legs, face is no longer swollen.  What is the next step?

## 2015-11-12 NOTE — Telephone Encounter (Signed)
Patient asymptomatic following statin holiday. Will route to pharmD for review.

## 2015-11-30 ENCOUNTER — Other Ambulatory Visit (INDEPENDENT_AMBULATORY_CARE_PROVIDER_SITE_OTHER): Payer: Medicare Other

## 2015-11-30 ENCOUNTER — Telehealth: Payer: Self-pay | Admitting: Family Medicine

## 2015-11-30 DIAGNOSIS — I6523 Occlusion and stenosis of bilateral carotid arteries: Secondary | ICD-10-CM

## 2015-11-30 DIAGNOSIS — E785 Hyperlipidemia, unspecified: Secondary | ICD-10-CM | POA: Diagnosis not present

## 2015-11-30 DIAGNOSIS — Z0000 Encounter for general adult medical examination without abnormal findings: Secondary | ICD-10-CM

## 2015-11-30 DIAGNOSIS — E559 Vitamin D deficiency, unspecified: Secondary | ICD-10-CM | POA: Diagnosis not present

## 2015-12-01 LAB — CBC WITH DIFFERENTIAL/PLATELET
BASOS ABS: 0 10*3/uL (ref 0.0–0.2)
Basos: 1 %
EOS (ABSOLUTE): 0.1 10*3/uL (ref 0.0–0.4)
EOS: 2 %
HEMATOCRIT: 43.9 % (ref 34.0–46.6)
HEMOGLOBIN: 14.7 g/dL (ref 11.1–15.9)
IMMATURE GRANS (ABS): 0 10*3/uL (ref 0.0–0.1)
Immature Granulocytes: 0 %
LYMPHS ABS: 2.5 10*3/uL (ref 0.7–3.1)
LYMPHS: 41 %
MCH: 30.3 pg (ref 26.6–33.0)
MCHC: 33.5 g/dL (ref 31.5–35.7)
MCV: 91 fL (ref 79–97)
MONOCYTES: 6 %
Monocytes Absolute: 0.4 10*3/uL (ref 0.1–0.9)
NEUTROS ABS: 3.1 10*3/uL (ref 1.4–7.0)
Neutrophils: 50 %
Platelets: 212 10*3/uL (ref 150–379)
RBC: 4.85 x10E6/uL (ref 3.77–5.28)
RDW: 14 % (ref 12.3–15.4)
WBC: 6.2 10*3/uL (ref 3.4–10.8)

## 2015-12-01 LAB — NMR, LIPOPROFILE
CHOLESTEROL: 292 mg/dL — AB (ref 100–199)
HDL Cholesterol by NMR: 50 mg/dL (ref >39)
HDL PARTICLE NUMBER: 35.8 umol/L (ref >=30.5)
LDL Particle Number: 2594 nmol/L — ABNORMAL HIGH (ref <1000)
LDL Size: 19.8 nm (ref >20.5)
LDL-C: 191 mg/dL — ABNORMAL HIGH (ref 0–99)
LP-IR SCORE: 56 — AB (ref <=45)
SMALL LDL PARTICLE NUMBER: 1945 nmol/L — AB (ref <=527)
Triglycerides by NMR: 253 mg/dL — ABNORMAL HIGH (ref 0–149)

## 2015-12-01 LAB — BMP8+EGFR
BUN / CREAT RATIO: 19 (ref 12–28)
BUN: 18 mg/dL (ref 8–27)
CALCIUM: 9.7 mg/dL (ref 8.7–10.3)
CHLORIDE: 104 mmol/L (ref 96–106)
CO2: 28 mmol/L (ref 18–29)
CREATININE: 0.97 mg/dL (ref 0.57–1.00)
GFR calc non Af Amer: 59 mL/min/{1.73_m2} — ABNORMAL LOW (ref >59)
GFR, EST AFRICAN AMERICAN: 67 mL/min/{1.73_m2} (ref >59)
Glucose: 92 mg/dL (ref 65–99)
Potassium: 5.3 mmol/L — ABNORMAL HIGH (ref 3.5–5.2)
Sodium: 145 mmol/L — ABNORMAL HIGH (ref 134–144)

## 2015-12-01 LAB — HEPATIC FUNCTION PANEL
ALBUMIN: 4.6 g/dL (ref 3.5–4.8)
ALK PHOS: 63 IU/L (ref 39–117)
ALT: 16 IU/L (ref 0–32)
AST: 20 IU/L (ref 0–40)
BILIRUBIN, DIRECT: 0.13 mg/dL (ref 0.00–0.40)
Bilirubin Total: 0.6 mg/dL (ref 0.0–1.2)
TOTAL PROTEIN: 7.8 g/dL (ref 6.0–8.5)

## 2015-12-01 LAB — VITAMIN D 25 HYDROXY (VIT D DEFICIENCY, FRACTURES): Vit D, 25-Hydroxy: 37 ng/mL (ref 30.0–100.0)

## 2015-12-02 ENCOUNTER — Other Ambulatory Visit: Payer: Self-pay | Admitting: Physician Assistant

## 2015-12-02 ENCOUNTER — Telehealth: Payer: Self-pay | Admitting: Pharmacist

## 2015-12-02 DIAGNOSIS — L570 Actinic keratosis: Secondary | ICD-10-CM | POA: Diagnosis not present

## 2015-12-02 DIAGNOSIS — D0439 Carcinoma in situ of skin of other parts of face: Secondary | ICD-10-CM | POA: Diagnosis not present

## 2015-12-02 DIAGNOSIS — C4492 Squamous cell carcinoma of skin, unspecified: Secondary | ICD-10-CM

## 2015-12-02 HISTORY — DX: Squamous cell carcinoma of skin, unspecified: C44.92

## 2015-12-02 NOTE — Telephone Encounter (Signed)
Pt called about lab results after being off Crestor

## 2015-12-03 NOTE — Telephone Encounter (Signed)
Pt reports being off crestor for 2 months and ldl has elevated since that time.   She states she has an appt on 10/9 with primary care provider.

## 2015-12-06 ENCOUNTER — Encounter: Payer: Self-pay | Admitting: Family Medicine

## 2015-12-06 ENCOUNTER — Ambulatory Visit (INDEPENDENT_AMBULATORY_CARE_PROVIDER_SITE_OTHER): Payer: Medicare Other | Admitting: Family Medicine

## 2015-12-06 ENCOUNTER — Ambulatory Visit (INDEPENDENT_AMBULATORY_CARE_PROVIDER_SITE_OTHER): Payer: Medicare Other

## 2015-12-06 VITALS — BP 152/82 | HR 60 | Temp 97.8°F | Ht 60.0 in | Wt 178.0 lb

## 2015-12-06 DIAGNOSIS — R7989 Other specified abnormal findings of blood chemistry: Secondary | ICD-10-CM | POA: Diagnosis not present

## 2015-12-06 DIAGNOSIS — E785 Hyperlipidemia, unspecified: Secondary | ICD-10-CM | POA: Diagnosis not present

## 2015-12-06 DIAGNOSIS — M79642 Pain in left hand: Secondary | ICD-10-CM

## 2015-12-06 DIAGNOSIS — I6523 Occlusion and stenosis of bilateral carotid arteries: Secondary | ICD-10-CM

## 2015-12-06 DIAGNOSIS — R030 Elevated blood-pressure reading, without diagnosis of hypertension: Secondary | ICD-10-CM

## 2015-12-06 DIAGNOSIS — E559 Vitamin D deficiency, unspecified: Secondary | ICD-10-CM | POA: Diagnosis not present

## 2015-12-06 DIAGNOSIS — Z789 Other specified health status: Secondary | ICD-10-CM | POA: Diagnosis not present

## 2015-12-06 NOTE — Patient Instructions (Addendum)
Medicare Annual Wellness Visit  [facility] and the medical providers at [facility] strive to bring you the best medical care.  In doing so we not only want to address your current medical conditions and concerns but also to detect new conditions early and prevent illness, disease and health-related problems.    Medicare offers a yearly Wellness Visit which allows our clinical staff to assess your need for preventative services including immunizations, lifestyle education, counseling to decrease risk of preventable diseases and screening for fall risk and other medical concerns.    This visit is provided free of charge (no copay) for all Medicare recipients. The clinical pharmacists at [facility] have begun to conduct these Wellness Visits which will also include a thorough review of all your medications.    As you primary medical provider recommend that you make an appointment for your Annual Wellness Visit if you have not done so already this year.  You may set up this appointment before you leave today or you may call back [phone] and schedule an appointment.  Please make sure when you call that you mention that you are scheduling your Annual Wellness Visit with the clinical pharmacist so that the appointment may be made for the proper length of time.     Continue current medications. Continue good therapeutic lifestyle changes which include good diet and exercise. Fall precautions discussed with patient. If an FOBT was given today- please return it to our front desk. If you are over 64 years old - you may need Prevnar 108 or the adult Pneumonia vaccine.  **Flu shots are available--- please call and schedule a FLU-CLINIC appointment**  After your visit with [place] today you will receive a survey in the mail or online from [employer] regarding your care with [place]. Please take a moment to fill this out. Your feedback is very  important to [place] as you can help [place] better understand your patient needs as well as improve your experience and satisfaction. WE CARE ABOUT YOU!!!   Continue to walk and exercise Watch the salt intake in your diet Record blood pressure readings at home and bring these readings in and about 4 weeks for a recheck of the blood pressure Do not forget to get your flu shot We will start you on another statin drug. If you haven't the muscle aches and pains she should stop it immediately. And call us. We will continue to try different statin drugs and didn't consider a PCS K-9 inhibitor treatment.  Take Tylenol for pain. Always walk on a flat surface and wear good shoes

## 2015-12-06 NOTE — Progress Notes (Signed)
Subjective:    Patient ID: Carol [name], female    DOB: Dec 14, 1945, 70 y.o.   MRN: [id number]  HPI Pt here for follow up and management of chronic medical problems which includes hyperlipidemia. She is taking medications regularly. The patient did stop her Crestor because of muscle aches and myalgias. She is due to get a chest x-ray today and has had lab work which we will also review with her today. She does have a significant history of heart disease and her family and she has seen the cardiologist and does have carotid artery stenosis. Her LDL particle number since stopping the statin drug is gone up to 2594 and her LDL C is 191. Triglycerides are elevated at 253. The blood sugar was good and her kidney function tests were good except the potassium was slightly increased. The creatinine was 0.97. The CBC was within normal limits and all liver function tests were normal. The vitamin D level was good at 37. This will be reviewed with her during the visit today and we will have to reapproach getting her cholesterol numbers down because of her carotid stenosis and family history of heart disease. Patient had a lot of aching when she added TriCor to the Crestor. She's waiting a while and added just the Crestor back and she had aching again and had to stop the Crestor. The goal for her LDL is to have it less than 70. During the initial visit today we explained the options. The patient's blood pressures at home have been running in the 120 to 1:30 range over the 58 to mid 60 range. The patient denies any chest pain or shortness of breath. She walks and exercises with her sister regularly several hours a week. She denies any trouble with heartburn indigestion nausea vomiting diarrhea blood in the stool black tarry bowel movements or change in bowel habits. She is passing her water without problems. She does hurt in her hands and the fingers of her hands and she should use her hands at work for years. She also  has been hurting some in her left ankle and the medial malleolus area.    Patient Active Problem List   Diagnosis Date Noted  . Carotid stenosis 07/22/2014  . Family history of heart disease 07/28/2013  . Need for prophylactic vaccination and inoculation against influenza 11/26/2012  . Diverticulosis   . Osteopenia 09/11/2012  . Hyperlipidemia LDL goal <70 07/23/2012  . H/O infectious disease 01/02/2012  . Pseudoptosis 11/07/2011   Outpatient Encounter Prescriptions as of 12/06/2015  Medication Sig  . aspirin EC 81 MG tablet Take 81 mg by mouth daily.  . Calcium 500-125 MG-UNIT TABS 1 tablet. 1 tablet daily.  . Cholecalciferol (VITAMIN D3) 2000 units capsule 1 capsule.  . Multiple Vitamins tablet 1 tablet. 1 tablet daily.  . OMEGA-3 1000 MG CAPS 2 g daily  . [DISCONTINUED] fenofibrate (TRICOR) 145 MG tablet Take 1 tablet (145 mg total) by mouth daily.  . [DISCONTINUED] rosuvastatin (CRESTOR) 40 MG tablet TAKE 1 TABLET DAILY (PLEASE MAKE APPOINTMENT FOR REFILLS)   No facility-administered encounter medications on file as of 12/06/2015.       Review of Systems  Constitutional: Negative.   HENT: Negative.   Eyes: Negative.   Respiratory: Negative.   Cardiovascular: Negative.   Gastrointestinal: Negative.   Endocrine: Negative.   Genitourinary: Negative.   Musculoskeletal: Positive for arthralgias (left hand and left ankle soreness).  Skin: Negative.   Allergic/Immunologic: Negative.   Neurological:  Negative.   Hematological: Negative.   Psychiatric/Behavioral: Negative.        Objective:   Physical Exam  Constitutional: She is oriented to person, place, and time. She appears well-developed and well-nourished. No distress.  HENT:  Head: Normocephalic and atraumatic.  Right Ear: External ear normal.  Left Ear: External ear normal.  Mouth/Throat: No oropharyngeal exudate.  Slight nasal congestion bilaterally  Eyes: Conjunctivae and EOM are normal. Pupils are equal,  round, and reactive to light. Right eye exhibits no discharge. Left eye exhibits no discharge. No scleral icterus.  Neck: Normal range of motion. Neck supple. No thyromegaly present.  No bruits were audible despite having stenoses of greater than 60%.  Cardiovascular: Normal rate, regular rhythm and intact distal pulses.   No murmur heard. The heart is regular at 72/m  Pulmonary/Chest: Effort normal and breath sounds normal. No respiratory distress. She has no wheezes. She has no rales.  Clear anteriorly and posteriorly  Abdominal: Soft. Bowel sounds are normal. She exhibits no mass. There is no tenderness. There is no rebound and no guarding.  No abdominal tenderness masses or bruits  Musculoskeletal: Normal range of motion. She exhibits no edema.  There is minimal swelling of the joints of the fingers of both hands. There is slight tenderness of the left medial malleolus without rubor or erythema.  Lymphadenopathy:    She has no cervical adenopathy.  Neurological: She is alert and oriented to person, place, and time. She has normal reflexes. No cranial nerve deficit.  Skin: Skin is warm and dry. No rash noted.  Psychiatric: She has a normal mood and affect. Her behavior is normal. Judgment and thought content normal.  Nursing note and vitals reviewed.  BP (!) 154/66 (BP Location: Left Arm)   Pulse 60   Temp 97.8 F (36.6 C) (Oral)   Ht 5' (1.524 m)   Wt 178 lb (80.7 kg)   BMI 34.76 kg/m   Chest x-ray with results pending      Assessment & Plan:  1. Hyperlipidemia LDL goal <70 -Continue to watch diet closely and we will try a different statin and if after taking another statin you have muscle aches or myalgias discontinue this immediately and get back in touch with [place] - DG Chest 2 View; Future  2. Vitamin D deficiency -Increase vitamin D3 2000 units 21 daily Monday through Friday and 2 on Saturday and Sunday  3. Pain of left hand -Take Tylenol for pain  4. Carotid  stenosis, bilateral -Continue aggressive therapeutic lifestyle changes and continue to try statin drugs to find out what she can be tolerant to  5. Low serum vitamin D - increase vitamin D3 and take 2000 units Monday through Friday and 4000 units on Saturday and Sunday  6. Elevated blood pressure reading -Record blood pressure readings and bring readings to the office in 4 weeks so that the nurse can recheck the blood pressure and review your home readings at that time -Watch sodium intake closely -Continue to exercise regularly and drink plenty of fluids  7. Statin intolerance -We will add a different statin either Livalo if we have samples are call in a prescription for pravastatin 40 and she will try this medication and if she is intolerant to it she will call us and discontinue it  Patient Instructions                       Medicare Annual Wellness Visit  [facility] and  the medical providers at [facility] strive to bring you the best medical care.  In doing so we not only want to address your current medical conditions and concerns but also to detect new conditions early and prevent illness, disease and health-related problems.    Medicare offers a yearly Wellness Visit which allows our clinical staff to assess your need for preventative services including immunizations, lifestyle education, counseling to decrease risk of preventable diseases and screening for fall risk and other medical concerns.    This visit is provided free of charge (no copay) for all Medicare recipients. The clinical pharmacists at [facility] have begun to conduct these Wellness Visits which will also include a thorough review of all your medications.    As you primary medical provider recommend that you make an appointment for your Annual Wellness Visit if you have not done so already this year.  You may set up this appointment before you leave today or you may call  back [phone]) and schedule an appointment.  Please make sure when you call that you mention that you are scheduling your Annual Wellness Visit with the clinical pharmacist so that the appointment may be made for the proper length of time.     Continue current medications. Continue good therapeutic lifestyle changes which include good diet and exercise. Fall precautions discussed with patient. If an FOBT was given today- please return it to our front desk. If you are over 29 years old - you may need Prevnar 60 or the adult Pneumonia vaccine.  **Flu shots are available--- please call and schedule a FLU-CLINIC appointment**  After your visit with [place] today you will receive a survey in the mail or online from [employer] regarding your care with [place]. Please take a moment to fill this out. Your feedback is very important to [place] as you can help [place] better understand your patient needs as well as improve your experience and satisfaction. WE CARE ABOUT YOU!!!   Continue to walk and exercise Watch the salt intake in your diet Record blood pressure readings at home and bring these readings in and about 4 weeks for a recheck of the blood pressure Do not forget to get your flu shot We will start you on another statin drug. If you haven't the muscle aches and pains she should stop it immediately. And call us. We will continue to try different statin drugs and didn't consider a PCS K-9 inhibitor treatment.  Take Tylenol for pain. Always walk on a flat surface and wear good shoes  Arrie Senate MD

## 2015-12-08 ENCOUNTER — Ambulatory Visit: Payer: Medicare Other | Admitting: Family Medicine

## 2015-12-09 NOTE — Telephone Encounter (Signed)
Spoke to patient about XOL - she reports that primary care doctor is going to try her on another statin medication (Livalo 2mg  taken daily).   I instructed her to call if she had any other concerns regarding lipids or if she was unable to tolerate the new statin.   She stated appreciation and understanding.

## 2015-12-23 DIAGNOSIS — L570 Actinic keratosis: Secondary | ICD-10-CM | POA: Diagnosis not present

## 2015-12-23 DIAGNOSIS — D0439 Carcinoma in situ of skin of other parts of face: Secondary | ICD-10-CM | POA: Diagnosis not present

## 2016-01-03 ENCOUNTER — Telehealth: Payer: Self-pay | Admitting: Family Medicine

## 2016-01-03 NOTE — Telephone Encounter (Signed)
lmtcb jkp 11/6

## 2016-01-03 NOTE — Telephone Encounter (Signed)
Patient wants to let you know that she has been taking the Livalo and is not having any problems with side effects.  She is concerned however that the cost is too high.  She will be signing up for her new Medicare prescription drug coverage very soon and this drug is a Tier 4 for her.  She would like to know what Dr. Laurance Flatten recommends.  Can we give her samples to take, do we have coupons?  Please advise.

## 2016-01-03 NOTE — Telephone Encounter (Signed)
Samples up front - LM - pt aware

## 2016-01-04 ENCOUNTER — Other Ambulatory Visit: Payer: Self-pay | Admitting: Family Medicine

## 2016-01-04 DIAGNOSIS — Z1231 Encounter for screening mammogram for malignant neoplasm of breast: Secondary | ICD-10-CM

## 2016-01-14 ENCOUNTER — Ambulatory Visit (HOSPITAL_COMMUNITY)
Admission: RE | Admit: 2016-01-14 | Discharge: 2016-01-14 | Disposition: A | Discharge status: Home / Self Care | Payer: Medicare Other | Source: Ambulatory Visit | Attending: Family Medicine | Admitting: Family Medicine

## 2016-01-14 DIAGNOSIS — Z1231 Encounter for screening mammogram for malignant neoplasm of breast: Secondary | ICD-10-CM | POA: Diagnosis not present

## 2016-01-27 ENCOUNTER — Telehealth: Payer: Self-pay | Admitting: Family Medicine

## 2016-01-27 NOTE — Telephone Encounter (Signed)
Scheduled

## 2016-02-03 ENCOUNTER — Ambulatory Visit (INDEPENDENT_AMBULATORY_CARE_PROVIDER_SITE_OTHER): Payer: Medicare Other | Admitting: Pharmacist

## 2016-02-03 ENCOUNTER — Encounter: Payer: Self-pay | Admitting: Pharmacist

## 2016-02-03 VITALS — BP 148/80 | HR 72 | Ht 66.0 in | Wt 181.5 lb

## 2016-02-03 DIAGNOSIS — E782 Mixed hyperlipidemia: Secondary | ICD-10-CM

## 2016-02-03 DIAGNOSIS — Z0000 Encounter for general adult medical examination without abnormal findings: Secondary | ICD-10-CM | POA: Diagnosis not present

## 2016-02-03 NOTE — Patient Instructions (Addendum)
  Carol [name] , Thank you for taking time to come for your Medicare Wellness Visit. I appreciate your ongoing commitment to your health goals. Please review the following plan we discussed and let me know if I can assist you in the future.   These are the goals we discussed: Continue to walk daily   Increase non-starchy vegetables - carrots, green bean, squash, zucchini, tomatoes, onions, peppers, spinach and other green leafy vegetables, cabbage, lettuce, cucumbers, asparagus, okra (not fried), eggplant Limit sugar and processed foods (cakes, cookies, ice cream, crackers and chips) Increase fresh fruit but limit serving sizes 1/2 cup or about the size of tennis or baseball Limit red meat to no more than 1-2 times per week (serving size about the size of your palm) Choose whole grains / lean proteins - whole wheat bread, quinoa, whole grain rice (1/2 cup), fish, chicken, [place] Avoid sugar and calorie containing beverages - soda, sweet tea and juice.  Choose water or unsweetened tea instead.    This is a list of the screening recommended for you and due dates:  Health Maintenance  Topic Date Due  . Flu Shot  09/28/2015  . Pneumonia vaccines (1 of 2 - PCV13) 07/20/2016*  . Tetanus Vaccine  02/28/2016  . DEXA scan (bone density measurement)  10/06/2016  . Mammogram  01/13/2017  . Colon Cancer Screening  05/24/2021  . Shingles Vaccine  Completed  .  Hepatitis C: One time screening is recommended by Center for Disease Control  (CDC) for  adults born from [birth year] through 1967.   Completed  *Topic was postponed. The date shown is not the original due date.

## 2016-02-03 NOTE — Progress Notes (Signed)
Patient ID: Carol [name], female   DOB: August 19, 1945, 70 y.o.   MRN: [id number]     Subjective:   Carol [name] is a 70 y.o. female who presents for a subsequent Medicare Annual Wellness Visit.  Carol [name] is widowed.  She retired from working at [employer].  She has 2 children and 2 grandchildren.  She is very active with church and takes several trips during the year.  She walks almost every day with her sister.  She does report that this last year has been hard - her sister Carol [name] died and her brother Carol [name]'s kidney disease has worsened and he will start dialysis soon.   She has no medical complaints herself.  Her last BP in office was noted to be elevated.  She has been checking BP at home and reports that home BP readings are: SBP = 120's to 140 and DBP = 60's to 70's  Current Medications (verified) Outpatient Encounter Prescriptions as of 02/03/2016  Medication Sig  . aspirin EC 81 MG tablet Take 81 mg by mouth daily.  . Calcium 500-125 MG-UNIT TABS 1 tablet. 1 tablet daily.  . Cholecalciferol (VITAMIN D3) 2000 units capsule 1 capsule.  . Multiple Vitamins tablet 1 tablet. 1 tablet daily.  . OMEGA-3 1000 MG CAPS 2 g daily  . Pitavastatin Calcium (LIVALO) 4 MG TABS Take 0.5 tablets by mouth daily.   No facility-administered encounter medications on file as of 02/03/2016.     Allergies (verified) Penicillins; Sulfa antibiotics; Sulfamethoxazole; Crestor [rosuvastatin calcium]; and Tricor [fenofibrate]   History: Past Medical History:  Diagnosis Date  . Carotid artery stenosis   . Cataract   . Diverticulosis   . Fractured hand    right  . Hyperlipidemia   . Osteopenia   . Precancerous skin lesion 2016   Past Surgical History:  Procedure Laterality Date  . CATARACT EXTRACTION W/ INTRAOCULAR LENS IMPLANT Bilateral   . CHOLECYSTECTOMY  2009  . EYE SURGERY     Family History  Problem Relation Age of Onset  . Heart disease Mother   . Heart disease Father     . Cancer Sister     breast  . Hyperlipidemia Sister   . Diabetes Sister   . Cancer Sister     bone  . Alzheimer's disease Sister   . Heart disease Brother   . CAD Brother   . Cancer Sister     breast  . Kidney disease Sister   . Heart disease Brother   . Hypertension Brother   . Alzheimer's disease Brother   . Kidney disease Brother     2018 starting dialysis   Social History   Occupational History  . Not on file.   Social History Main Topics  . Smoking status: Never Smoker  . Smokeless tobacco: Never Used  . Alcohol use No  . Drug use: No  . Sexual activity: No    Do you feel safe at home?  Yes Are there smokers in your home (other than you)? No  Dietary issues and exercise activities: Current Exercise Habits: Home exercise routine, Type of exercise: walking, Time (Minutes): 60 (3 miles), Frequency (Times/Week): 5, Weekly Exercise (Minutes/Week): 300  Current Dietary habits:  Eats a variety of fruit and vegetables.  Last year she was limiting portion sizes more because she was going to [employer] but she has not attended any [employer] meetings lately.   Objective:    Today's Vitals   02/03/16 0908 02/03/16  0917  BP: (!) 158/72 (!) 148/80  Pulse: 72   Weight: 181 lb 8 oz (82.3 kg)   Height: 5\' 6"  (1.676 m)   PainSc: 0-No pain    Body mass index is 29.29 kg/m.  Activities of Daily Living In your present state of health, do you have any difficulty performing the following activities: 02/03/2016  Hearing? Y - pt saw audiologist after our appt last year and now wears hearing aids  Vision? N  Difficulty concentrating or making decisions? N  Walking or climbing stairs? N  Dressing or bathing? N  Doing errands, shopping? N  Preparing Food and eating ? N  Using the Toilet? N  In the past six months, have you accidently leaked urine? N  Do you have problems with loss of bowel control? N  Managing your Medications? N  Managing your Finances? N  Housekeeping or  managing your Housekeeping? N  Some recent data might be hidden     Cardiac Risk Factors include: advanced age (>80men, >59 women);dyslipidemia;family history of premature cardiovascular disease;hypertension;obesity (BMI >30kg/m2)  Depression Screen PHQ 2/9 Scores 02/03/2016 12/06/2015 06/04/2015 03/05/2015  PHQ - 2 Score 0 0 0 0     Fall Risk Fall Risk  02/03/2016 12/06/2015 06/04/2015 03/05/2015 12/04/2014  Falls in the past year? No No No No Yes  Number falls in past yr: - - - - 1  Injury with Fall? - - - - No  Follow up - - - - -    Cognitive Function: MMSE - Mini Mental State Exam 02/03/2016 10/07/2014  Orientation to time 5 5  Orientation to Place 5 5  Registration 3 3  Attention/ Calculation 5 5  Recall 2 3  Language- name 2 objects 2 2  Language- repeat 1 1  Language- follow 3 step command 3 3  Language- read & follow direction 1 1  Write a sentence 1 1  Copy design 1 1  Total score 29 30    Immunizations and Health Maintenance Immunization History  Administered Date(s) Administered  . Influenza,inj,Quad PF,36+ Mos 11/26/2012, 11/26/2013, 12/04/2014  . Tdap 02/27/2006  . Zoster 12/19/2013   There are no preventive care reminders to display for this patient.  Patient Care Team: Chipper Herb, MD as PCP - General (Family Medicine) Lavonna Monarch, MD as Consulting Physician (Dermatology) Luberta Mutter, MD as Consulting Physician (Ophthalmology) Ronald Lobo, MD as Consulting Physician (Gastroenterology) Pixie Casino, MD as Consulting Physician (Cardiology)  Indicate any recent Medical Services you may have received from other than [facility] providers in the past year (date may be approximate).    Assessment:    Annual Wellness Visit  HTN - possibly white coat given normal BP reading at home   Screening Tests Health Maintenance  Topic Date Due  . INFLUENZA VACCINE  05/27/2016 (Originally 09/28/2015)  . PNA vac Low Risk Adult (1 of 2 - PCV13) 07/20/2016 (Originally  09/24/2008)  . TETANUS/TDAP  02/28/2016  . DEXA SCAN  10/06/2016  . MAMMOGRAM  01/13/2017  . COLONOSCOPY  05/24/2021  . ZOSTAVAX  Completed  . Hepatitis C Screening  Completed        Plan:   During the course of the visit Chyla was educated and counseled about the following appropriate screening and preventive services:   Vaccines to include Pneumoccal, Influenza -  Patient declined    Td, Zostavax - UTD  Colorectal cancer screening - colonoscopy and FOBT are both UTD  Cardiovascular disease screening - sees Dr  Hilty regularly; EKG is UTD  Last lipids panel showed elevated LDL since patient stopped rosuvastatin and fenofibrate due to myalgias.  She started Livalo 4mg  1/2 tablet daily about 1 month ago and is due to have lipids rechecked in January 2018 - appt made for labs.  Bring in home BP monitor to check against our office BP monitor.    Diabetes screening - UTD last FBG was 92  Bone Denisty / Osteoporosis Screening - UTD  Mammogram - UTD  PAP - last was 2015; yearly no longer required  Glaucoma screening /  Eye Exam - UTD  Nutrition counseling - discussed increasing whole grains and choosing lean proteins.  Increase non starchy vegetables and limit processed foods  Advanced Directives - UTD, copy requested  Physical Activity - continue to walk daily (weekly goal is 150 minutes)    Patient Instructions (the written plan) were given to the patient.   Cherre Robins, PharmD   02/03/2016

## 2016-03-07 ENCOUNTER — Other Ambulatory Visit: Payer: 59

## 2016-03-07 DIAGNOSIS — E782 Mixed hyperlipidemia: Secondary | ICD-10-CM | POA: Diagnosis not present

## 2016-03-08 LAB — HEPATIC FUNCTION PANEL
ALT: 20 IU/L (ref 0–32)
AST: 22 IU/L (ref 0–40)
Albumin: 4.3 g/dL (ref 3.5–4.8)
Alkaline Phosphatase: 66 IU/L (ref 39–117)
BILIRUBIN TOTAL: 0.6 mg/dL (ref 0.0–1.2)
Bilirubin, Direct: 0.15 mg/dL (ref 0.00–0.40)
Total Protein: 6.9 g/dL (ref 6.0–8.5)

## 2016-03-08 LAB — LIPID PANEL
CHOL/HDL RATIO: 4.4 ratio (ref 0.0–4.4)
CHOLESTEROL TOTAL: 230 mg/dL — AB (ref 100–199)
HDL: 52 mg/dL (ref >39)
LDL CALC: 125 mg/dL — AB (ref 0–99)
TRIGLYCERIDES: 265 mg/dL — AB (ref 0–149)
VLDL CHOLESTEROL CAL: 53 mg/dL — AB (ref 5–40)

## 2016-03-10 ENCOUNTER — Telehealth: Payer: Self-pay | Admitting: Pharmacist

## 2016-03-10 ENCOUNTER — Telehealth: Payer: Self-pay | Admitting: Family Medicine

## 2016-03-10 NOTE — Telephone Encounter (Signed)
Tried to call patient regarding cholesterol results but no answer.  Left message for patient to call office to discuss.

## 2016-03-13 NOTE — Telephone Encounter (Signed)
Left message that LDL is better but not at goal.  Recommended increase Livalo to 4mg  1 tablet daily - recheck lipids and LFTs in 3 months.  Rx sent to pharmacy 03/10/16

## 2016-03-17 ENCOUNTER — Other Ambulatory Visit: Payer: Self-pay | Admitting: Family Medicine

## 2016-03-20 ENCOUNTER — Telehealth: Payer: Self-pay | Admitting: Family Medicine

## 2016-03-20 MED ORDER — PITAVASTATIN CALCIUM 4 MG PO TABS: 1.0000 | ORAL_TABLET | Freq: Every day | ORAL | 1 refills | Status: DC

## 2016-03-20 NOTE — Telephone Encounter (Signed)
No samples avail; -- pt aware  - sent to mail order

## 2016-03-20 NOTE — Telephone Encounter (Signed)
Patient requested her Livalo be sent to [facility] in [place]. RX sent. [facility].

## 2016-03-24 DIAGNOSIS — Z85828 Personal history of other malignant neoplasm of skin: Secondary | ICD-10-CM | POA: Diagnosis not present

## 2016-03-24 DIAGNOSIS — L570 Actinic keratosis: Secondary | ICD-10-CM | POA: Diagnosis not present

## 2016-06-06 ENCOUNTER — Encounter: Payer: Self-pay | Admitting: Family Medicine

## 2016-06-06 ENCOUNTER — Ambulatory Visit (INDEPENDENT_AMBULATORY_CARE_PROVIDER_SITE_OTHER): Payer: Medicare HMO | Admitting: Family Medicine

## 2016-06-06 VITALS — BP 132/68 | HR 56 | Temp 97.2°F | Ht 66.0 in | Wt 173.0 lb

## 2016-06-06 DIAGNOSIS — R030 Elevated blood-pressure reading, without diagnosis of hypertension: Secondary | ICD-10-CM | POA: Diagnosis not present

## 2016-06-06 DIAGNOSIS — I6523 Occlusion and stenosis of bilateral carotid arteries: Secondary | ICD-10-CM | POA: Diagnosis not present

## 2016-06-06 DIAGNOSIS — E559 Vitamin D deficiency, unspecified: Secondary | ICD-10-CM | POA: Diagnosis not present

## 2016-06-06 DIAGNOSIS — E782 Mixed hyperlipidemia: Secondary | ICD-10-CM

## 2016-06-06 NOTE — Progress Notes (Signed)
Subjective:    Patient ID: Carol [name], female    DOB: 1945-04-06, 71 y.o.   MRN: [id number]  HPI Pt here for follow up and management of chronic medical problems which includes hyperlipidemia. She is taking medication regularly.The patient is doing well overall. She is taking half of a Livalo are 2 mg daily. She continues to take her omega-3 fatty acids. She does have a history of carotid stenosis and we will make sure that she follows up regularly for Dopplers of this. She is due to get her pelvic exam and this will be scheduled before she leaves office. She is also due to to return an FOBT and get lab work. The patient is pleasant and alert. Her family history was reviewed again with her in detail after looking at the chart. She has a brother that is on dialysis and she's not sure what caused his kidneys to fail. The family history is very positive for heart disease in her parents and one of her siblings. 2 sisters have had breast cancer. One has had ovarian cancer. 1 sibling has been recently diagnosed with dementia. There is no other family history of that. The patient denies any chest pain or shortness of breath. She denies any trouble with her intestinal tract including nausea vomiting diarrhea or blood in the stool. Her last colonoscopy was in March 2013. She is passing her water without problems. She is currently taking half of a 4 mg Livalo and taking 2 mg daily with no problems with this. She is questioning whether she should try a different statin that would be cheaper. We will await her blood work comes back and discuss this further at that time. The patient indicates that she is due to see the cardiologist in May but would prefer not to have to drive to [facility] if possible. She will call and schedule this with the cardiologist that comes to [facility]. The goal weight for this patient should be about 160 pounds based on a BMI of 25 and her current height.    Patient Active Problem List     Diagnosis Date Noted  . Carotid stenosis 07/22/2014  . Family history of heart disease 07/28/2013  . Need for prophylactic vaccination and inoculation against influenza 11/26/2012  . Diverticulosis   . Osteopenia 09/11/2012  . Hyperlipidemia LDL goal <70 07/23/2012  . H/O infectious disease 01/02/2012  . Pseudoptosis 11/07/2011   Outpatient Encounter Prescriptions as of 06/06/2016  Medication Sig  . aspirin EC 81 MG tablet Take 81 mg by mouth daily.  . Calcium 500-125 MG-UNIT TABS 1 tablet. 1 tablet daily.  . Cholecalciferol (VITAMIN D3) 2000 units capsule 1 capsule.  . Multiple Vitamins tablet 1 tablet. 1 tablet daily.  . OMEGA-3 1000 MG CAPS 2 g daily  . Pitavastatin Calcium (LIVALO) 4 MG TABS Take 1 tablet (4 mg total) by mouth daily.   No facility-administered encounter medications on file as of 06/06/2016.       Review of Systems  Constitutional: Negative.   HENT: Negative.   Eyes: Negative.   Respiratory: Negative.   Cardiovascular: Negative.   Gastrointestinal: Negative.   Endocrine: Negative.   Genitourinary: Negative.   Musculoskeletal: Negative.   Skin: Negative.   Allergic/Immunologic: Negative.   Neurological: Negative.   Hematological: Negative.   Psychiatric/Behavioral: Negative.        Objective:   Physical Exam  Constitutional: She is oriented to person, place, and time. She appears well-developed and well-nourished.  The patient  is pleasant and alert.  HENT:  Head: Normocephalic and atraumatic.  Right Ear: External ear normal.  Left Ear: External ear normal.  Nose: Nose normal.  Mouth/Throat: Oropharynx is clear and moist.  Eyes: Conjunctivae and EOM are normal. Pupils are equal, round, and reactive to light. Right eye exhibits no discharge. Left eye exhibits no discharge. No scleral icterus.  Neck: Normal range of motion. Neck supple. No thyromegaly present.  Today, no bruits were audible by auscultation by me. She does have according to her 68%  blockages in the carotids. She will need to make sure she follows up on this regularly.  Cardiovascular: Normal rate, regular rhythm, normal heart sounds and intact distal pulses.   No murmur heard. The heart is regular at 60/m  Pulmonary/Chest: Effort normal and breath sounds normal. No respiratory distress. She has no wheezes. She has no rales. She exhibits no tenderness.  Clear anteriorly and posteriorly  Abdominal: Soft. Bowel sounds are normal. She exhibits no mass. There is no tenderness. There is no rebound and no guarding.  No abdominal tenderness masses or organ enlargement  Musculoskeletal: Normal range of motion. She exhibits no edema.  Lymphadenopathy:    She has no cervical adenopathy.  Neurological: She is alert and oriented to person, place, and time. She has normal reflexes. No cranial nerve deficit.  Skin: Skin is warm and dry. No rash noted.  Psychiatric: She has a normal mood and affect. Her behavior is normal. Judgment and thought content normal.  Nursing note and vitals reviewed.   BP 132/68 (BP Location: Left Arm)   Pulse (!) 56   Temp 97.2 F (36.2 C) (Oral)   Ht '5\' 6"'$  (1.676 m)   Wt 173 lb (78.5 kg)   BMI 27.92 kg/m        Assessment & Plan:  1. Mixed hyperlipidemia -For now, continue with Livalo one half of 4 mg daily we will discuss changing this in the future. I have a note in the record that she was intolerant to other statin drugs. - CBC with Differential/Platelet - BMP8+EGFR - Hepatic function panel - NMR, lipoprofile  2. Vitamin D deficiency -Continue weightbearing exercise and current vitamin D replacement pending results of lab work - CBC with Differential/Platelet - VITAMIN D 25 Hydroxy (Vit-D Deficiency, Fractures)  3. Elevated blood pressure reading -Continue sodium restriction and regular exercise - CBC with Differential/Platelet - BMP8+EGFR - Hepatic function panel  4. Carotid stenosis, bilateral -Follow-up with carotid Dopplers  soon. The last study was done in April 2017. There was a 40-59% blockage in the right internal carotid artery and a 1-39% on the left. The cardiologist can arrange this.  Patient Instructions                       Medicare Annual Wellness Visit  [facility] and the medical providers at [facility] strive to bring you the best medical care.  In doing so we not only want to address your current medical conditions and concerns but also to detect new conditions early and prevent illness, disease and health-related problems.    Medicare offers a yearly Wellness Visit which allows our clinical staff to assess your need for preventative services including immunizations, lifestyle education, counseling to decrease risk of preventable diseases and screening for fall risk and other medical concerns.    This visit is provided free of charge (no copay) for all Medicare recipients. The clinical pharmacists at [facility] have begun to conduct these Wellness Visits which will also include a thorough review of all your medications.    As you primary medical provider recommend that you make an appointment for your Annual Wellness Visit if you have not done so already this year.  You may set up this appointment before you leave today or you may call back ([phone]) and schedule an appointment.  Please make sure when you call that you mention that you are scheduling your Annual Wellness Visit with the clinical pharmacist so that the appointment may be made for the proper length of time.    Continue current medications. Continue good therapeutic lifestyle changes which include good diet and exercise. Fall precautions discussed with patient. If an FOBT was given today- please return it to our front desk. If you are over 53 years old - you may need Prevnar 61 or the adult Pneumonia vaccine.  **Flu shots are available--- please call and schedule a FLU-CLINIC  appointment**  After your visit with [place] today you will receive a survey in the mail or online from [employer] regarding your care with [place]. Please take a moment to fill this out. Your feedback is very important to [place] as you can help [place] better understand your patient needs as well as improve your experience and satisfaction. WE CARE ABOUT YOU!!!   Continue with Weight Watchers Your goal weight according to your height should be about 160 pounds. This would give you a BMI of 25. Continue with aggressive therapeutic lifestyle changes including diet and exercise Follow-up with cardiology as planned Arrange to get pelvic exam as planned Switch to Barnie Alderman MD

## 2016-06-06 NOTE — Patient Instructions (Addendum)
Medicare Annual Wellness Visit  [facility] and the medical providers at [facility] strive to bring you the best medical care.  In doing so we not only want to address your current medical conditions and concerns but also to detect new conditions early and prevent illness, disease and health-related problems.    Medicare offers a yearly Wellness Visit which allows our clinical staff to assess your need for preventative services including immunizations, lifestyle education, counseling to decrease risk of preventable diseases and screening for fall risk and other medical concerns.    This visit is provided free of charge (no copay) for all Medicare recipients. The clinical pharmacists at [facility] have begun to conduct these Wellness Visits which will also include a thorough review of all your medications.    As you primary medical provider recommend that you make an appointment for your Annual Wellness Visit if you have not done so already this year.  You may set up this appointment before you leave today or you may call back ([phone]) and schedule an appointment.  Please make sure when you call that you mention that you are scheduling your Annual Wellness Visit with the clinical pharmacist so that the appointment may be made for the proper length of time.    Continue current medications. Continue good therapeutic lifestyle changes which include good diet and exercise. Fall precautions discussed with patient. If an FOBT was given today- please return it to our front desk. If you are over 47 years old - you may need Prevnar 31 or the adult Pneumonia vaccine.  **Flu shots are available--- please call and schedule a FLU-CLINIC appointment**  After your visit with [place] today you will receive a survey in the mail or online from [employer] regarding your care with [place]. Please take a moment to fill this out. Your feedback is very  important to [place] as you can help [place] better understand your patient needs as well as improve your experience and satisfaction. WE CARE ABOUT YOU!!!   Continue with Weight Watchers Your goal weight according to your height should be about 160 pounds. This would give you a BMI of 25. Continue with aggressive therapeutic lifestyle changes including diet and exercise Follow-up with cardiology as planned Arrange to get pelvic exam as planned Switch to [facility]

## 2016-06-07 ENCOUNTER — Other Ambulatory Visit: Payer: Medicare HMO

## 2016-06-07 DIAGNOSIS — Z1211 Encounter for screening for malignant neoplasm of colon: Secondary | ICD-10-CM

## 2016-06-07 LAB — CBC WITH DIFFERENTIAL/PLATELET
BASOS: 1 %
Basophils Absolute: 0 10*3/uL (ref 0.0–0.2)
EOS (ABSOLUTE): 0.2 10*3/uL (ref 0.0–0.4)
EOS: 3 %
Hematocrit: 40.8 % (ref 34.0–46.6)
Hemoglobin: 13.6 g/dL (ref 11.1–15.9)
IMMATURE GRANS (ABS): 0 10*3/uL (ref 0.0–0.1)
IMMATURE GRANULOCYTES: 0 %
LYMPHS: 37 %
Lymphocytes Absolute: 2.4 10*3/uL (ref 0.7–3.1)
MCH: 29.9 pg (ref 26.6–33.0)
MCHC: 33.3 g/dL (ref 31.5–35.7)
MCV: 90 fL (ref 79–97)
MONOCYTES: 8 %
Monocytes Absolute: 0.5 10*3/uL (ref 0.1–0.9)
NEUTROS PCT: 51 %
Neutrophils Absolute: 3.3 10*3/uL (ref 1.4–7.0)
PLATELETS: 170 10*3/uL (ref 150–379)
RBC: 4.55 x10E6/uL (ref 3.77–5.28)
RDW: 14.2 % (ref 12.3–15.4)
WBC: 6.4 10*3/uL (ref 3.4–10.8)

## 2016-06-07 LAB — HEPATIC FUNCTION PANEL
ALT: 17 IU/L (ref 0–32)
AST: 17 IU/L (ref 0–40)
Albumin: 4.3 g/dL (ref 3.5–4.8)
Alkaline Phosphatase: 57 IU/L (ref 39–117)
BILIRUBIN TOTAL: 0.7 mg/dL (ref 0.0–1.2)
BILIRUBIN, DIRECT: 0.17 mg/dL (ref 0.00–0.40)
TOTAL PROTEIN: 7.1 g/dL (ref 6.0–8.5)

## 2016-06-07 LAB — NMR, LIPOPROFILE
CHOLESTEROL: 220 mg/dL — AB (ref 100–199)
HDL Cholesterol by NMR: 46 mg/dL (ref >39)
HDL PARTICLE NUMBER: 33.4 umol/L (ref >=30.5)
LDL PARTICLE NUMBER: 1580 nmol/L — AB (ref <1000)
LDL Size: 20.6 nm (ref >20.5)
LDL-C: 115 mg/dL — AB (ref 0–99)
LP-IR Score: 66 — ABNORMAL HIGH (ref <=45)
Small LDL Particle Number: 567 nmol/L — ABNORMAL HIGH (ref <=527)
TRIGLYCERIDES BY NMR: 295 mg/dL — AB (ref 0–149)

## 2016-06-07 LAB — BMP8+EGFR
BUN/Creatinine Ratio: 14 (ref 12–28)
BUN: 13 mg/dL (ref 8–27)
CALCIUM: 9.5 mg/dL (ref 8.7–10.3)
CO2: 28 mmol/L (ref 18–29)
Chloride: 104 mmol/L (ref 96–106)
Creatinine, Ser: 0.92 mg/dL (ref 0.57–1.00)
GFR calc non Af Amer: 62 mL/min/{1.73_m2} (ref >59)
GFR, EST AFRICAN AMERICAN: 72 mL/min/{1.73_m2} (ref >59)
GLUCOSE: 83 mg/dL (ref 65–99)
Potassium: 4.3 mmol/L (ref 3.5–5.2)
Sodium: 144 mmol/L (ref 134–144)

## 2016-06-07 LAB — VITAMIN D 25 HYDROXY (VIT D DEFICIENCY, FRACTURES): Vit D, 25-Hydroxy: 34.6 ng/mL (ref 30.0–100.0)

## 2016-06-08 ENCOUNTER — Other Ambulatory Visit: Payer: Self-pay | Admitting: *Deleted

## 2016-06-08 DIAGNOSIS — E7800 Pure hypercholesterolemia, unspecified: Secondary | ICD-10-CM

## 2016-06-08 LAB — FECAL OCCULT BLOOD, IMMUNOCHEMICAL: Fecal Occult Bld: NEGATIVE

## 2016-07-05 DIAGNOSIS — Z7982 Long term (current) use of aspirin: Secondary | ICD-10-CM | POA: Diagnosis not present

## 2016-07-05 DIAGNOSIS — R011 Cardiac murmur, unspecified: Secondary | ICD-10-CM | POA: Diagnosis not present

## 2016-07-05 DIAGNOSIS — Z0000 Encounter for general adult medical examination without abnormal findings: Secondary | ICD-10-CM | POA: Diagnosis not present

## 2016-07-05 DIAGNOSIS — E785 Hyperlipidemia, unspecified: Secondary | ICD-10-CM | POA: Diagnosis not present

## 2016-07-05 DIAGNOSIS — Z79899 Other long term (current) drug therapy: Secondary | ICD-10-CM | POA: Diagnosis not present

## 2016-07-05 DIAGNOSIS — H9113 Presbycusis, bilateral: Secondary | ICD-10-CM | POA: Diagnosis not present

## 2016-07-05 DIAGNOSIS — Z974 Presence of external hearing-aid: Secondary | ICD-10-CM | POA: Diagnosis not present

## 2016-07-10 ENCOUNTER — Ambulatory Visit (INDEPENDENT_AMBULATORY_CARE_PROVIDER_SITE_OTHER): Payer: Medicare HMO | Admitting: Pharmacist

## 2016-07-10 DIAGNOSIS — E7800 Pure hypercholesterolemia, unspecified: Secondary | ICD-10-CM | POA: Diagnosis not present

## 2016-07-10 NOTE — Progress Notes (Signed)
Subjective:    Carol [name] is a 71 y.o. female who presents for evaluation of dyslipidemia. She saw her PCP, Dr Laurance Flatten 06/06/2016.  At that time labs showed that although LDL has improved greatly with Livalo 2mg  daily she is not at goal os less than 70.  Dr Laurance Flatten increased Livalo to 4mg  daily.  She is due to have labs rechecked today.  The patient does not use medications that may worsen dyslipidemias (corticosteroids, progestins, anabolic steroids, diuretics, beta-blockers, amiodarone, cyclosporine, olanzapine). Exercise: 3-4 times per week.  1 hour per session. Previous history of cardiac disease includes: Coronary Artery Disease - carotid aretery stenosis of mild to moderate severity  Diet: whole wheat bread, limits sweets and potatoes.  Limits red meats - mostly fish or chicken  In th past she has taken other lipid lowering therapies.  Experienced myalgias with atorvastatin (though she cannot recall taking atorvastatin before and mentions with she might be willing to retry).  Also had myalgias with Crester + fenofibrate combination.   She also has taken Vytroin in the past and tolerated well but was changed to Crestor by cardiologist when diagnosed with carotid artery stenosis  - probably due to Higher potency and that crestor has been shown to regress plague.    Cardiac Risk Factors Age > 45-female, > 55-female:  YES  +1  Smoking:   NO  Sig. family hx of CHD*:  YES  +1  Hypertension:   NO  Diabetes:   NO  HDL < 35:   NO  HDL > 59:   NO  Total:  2   *Significant family history of CHD per NCEP = MI or sudden death at less than 61 year old in father or other 1st-degree female relative, or less than 6 year old in mother or  other 1st-degree female relative  The following portions of the patient's history were reviewed and updated as appropriate: allergies, current medications, past family history, past medical history, past social history, past surgical history and problem  list.   Objective:    Lab Review Lab on 06/07/2016  Component Date Value  . Fecal Occult Bld 06/07/2016 Negative   Office Visit on 06/06/2016  Component Date Value  . WBC 06/06/2016 6.4   . RBC 06/06/2016 4.55   . Hemoglobin 06/06/2016 13.6   . Hematocrit 06/06/2016 40.8   . MCV 06/06/2016 90   . [place] 06/06/2016 29.9   . MCHC 06/06/2016 33.3   . RDW 06/06/2016 14.2   . Platelets 06/06/2016 170   . Neutrophils 06/06/2016 51   . Lymphs 06/06/2016 37   . Monocytes 06/06/2016 8   . Eos 06/06/2016 3   . Basos 06/06/2016 1   . Neutrophils Absolute 06/06/2016 3.3   . Lymphocytes Absolute 06/06/2016 2.4   . Monocytes Absolute 06/06/2016 0.5   . EOS (ABSOLUTE) 06/06/2016 0.2   . Basophils Absolute 06/06/2016 0.0   . Immature Granulocytes 06/06/2016 0   . Immature Grans (Abs) 06/06/2016 0.0   . Glucose 06/06/2016 83   . BUN 06/06/2016 13   . Creatinine, Ser 06/06/2016 0.92   . GFR calc non Af Amer 06/06/2016 62   . GFR calc Af Amer 06/06/2016 72   . BUN/Creatinine Ratio 06/06/2016 14   . Sodium 06/06/2016 144   . Potassium 06/06/2016 4.3   . Chloride 06/06/2016 104   . CO2 06/06/2016 28   . Calcium 06/06/2016 9.5   . Total Protein 06/06/2016 7.1   . Albumin 06/06/2016  4.3   . Bilirubin Total 06/06/2016 0.7   . Bilirubin, Direct 06/06/2016 0.17   . Alkaline Phosphatase 06/06/2016 57   . AST 06/06/2016 17   . ALT 06/06/2016 17   . Vit D, 25-Hydroxy 06/06/2016 34.6   . LDL Particle Number 06/06/2016 [id number]*  . LDL-C 06/06/2016 115*  . HDL Cholesterol by NMR 06/06/2016 46   . Triglycerides by NMR 06/06/2016 295*  . Cholesterol 06/06/2016 220*  . HDL Particle Number 06/06/2016 33.4   . Small LDL Particle Number 06/06/2016 567*  . LDL Size 06/06/2016 20.6   . LP-IR Score 06/06/2016 66*  Appointment on 03/07/2016  Component Date Value  . Cholesterol, Total 03/07/2016 230*  . Triglycerides 03/07/2016 265*  . HDL 03/07/2016 52   . VLDL Cholesterol Cal 03/07/2016 53*  . LDL  Calculated 03/07/2016 125*  . Chol/HDL Ratio 03/07/2016 4.4   . Total Protein 03/07/2016 6.9   . Albumin 03/07/2016 4.3   . Bilirubin Total 03/07/2016 0.6   . Bilirubin, Direct 03/07/2016 0.15   . Alkaline Phosphatase 03/07/2016 66   . AST 03/07/2016 22   . ALT 03/07/2016 20       Assessment:    Dyslipidemia - with carotid artery stenosis  Target levels for LDL are: < 70 mg/dl ("very high" risk for CHD)    Plan:    The following changes are planned for the next 3 months, at which time the patient will return for repeat fasting lipids:  1. Dietary changes: Reduce saturated fat, "trans" monounsaturated fatty acids, and cholesterol 2. Exercise changes:  continue to walk 1 hour 3 to 4 times per week 3. Lipid-lowering medications: continue livalo 4mg  qd - labs drawn today and pending.     Will consider cheaper alternative when results available such as atorvastatin 40 to 80mg  or rosuvastatin 20 to 30 mg

## 2016-07-11 LAB — LIPID PANEL
Chol/HDL Ratio: 4 ratio (ref 0.0–4.4)
Cholesterol, Total: 188 mg/dL (ref 100–199)
HDL: 47 mg/dL (ref >39)
LDL Calculated: 93 mg/dL (ref 0–99)
Triglycerides: 241 mg/dL — ABNORMAL HIGH (ref 0–149)
VLDL CHOLESTEROL CAL: 48 mg/dL — AB (ref 5–40)

## 2016-07-11 LAB — HEPATIC FUNCTION PANEL
ALK PHOS: 58 IU/L (ref 39–117)
ALT: 14 IU/L (ref 0–32)
AST: 18 IU/L (ref 0–40)
Albumin: 4.3 g/dL (ref 3.5–4.8)
BILIRUBIN, DIRECT: 0.13 mg/dL (ref 0.00–0.40)
Bilirubin Total: 0.5 mg/dL (ref 0.0–1.2)
Total Protein: 6.9 g/dL (ref 6.0–8.5)

## 2016-07-18 ENCOUNTER — Telehealth: Payer: Self-pay | Admitting: Family Medicine

## 2016-07-19 NOTE — Telephone Encounter (Signed)
Tried to call patient to discuss recent lipid labs and discuss treatment options.  No ans / LM on VM

## 2016-07-20 NOTE — Telephone Encounter (Signed)
Discussed results of lipid panel.  Recommended change Livalo to atorvastatin due to cost of Livalo.  Patient has asked to hold off on chagning until she sees her cardiologist Jul 26, 2016.  Ok to wait since she has at least 1 weeks of Livalo samples.

## 2016-07-26 ENCOUNTER — Ambulatory Visit (INDEPENDENT_AMBULATORY_CARE_PROVIDER_SITE_OTHER): Payer: Medicare HMO | Admitting: Internal Medicine

## 2016-07-26 ENCOUNTER — Ambulatory Visit (HOSPITAL_COMMUNITY)
Admission: RE | Admit: 2016-07-26 | Discharge: 2016-07-26 | Disposition: A | Discharge status: Home / Self Care | Payer: Medicare HMO | Source: Ambulatory Visit | Attending: Internal Medicine | Admitting: Internal Medicine

## 2016-07-26 ENCOUNTER — Encounter: Payer: Self-pay | Admitting: Internal Medicine

## 2016-07-26 VITALS — BP 158/76 | HR 53 | Ht 66.0 in | Wt 170.0 lb

## 2016-07-26 DIAGNOSIS — R011 Cardiac murmur, unspecified: Secondary | ICD-10-CM | POA: Diagnosis not present

## 2016-07-26 DIAGNOSIS — E785 Hyperlipidemia, unspecified: Secondary | ICD-10-CM

## 2016-07-26 DIAGNOSIS — I739 Peripheral vascular disease, unspecified: Secondary | ICD-10-CM

## 2016-07-26 DIAGNOSIS — I6523 Occlusion and stenosis of bilateral carotid arteries: Secondary | ICD-10-CM | POA: Insufficient documentation

## 2016-07-26 DIAGNOSIS — I779 Disorder of arteries and arterioles, unspecified: Secondary | ICD-10-CM

## 2016-07-26 DIAGNOSIS — Z8249 Family history of ischemic heart disease and other diseases of the circulatory system: Secondary | ICD-10-CM

## 2016-07-26 MED ORDER — ATORVASTATIN CALCIUM 40 MG PO TABS: 40.0000 mg | ORAL_TABLET | Freq: Every day | ORAL | 3 refills | Status: DC

## 2016-07-26 NOTE — Patient Instructions (Addendum)
Your physician has recommended you make the following change in your medication:  -- STOP pitavastatin  -- START atorvastatin 40mg   Your physician recommends that you return for lab work in: 3 months - fasting  Your physician has requested that you have a carotid duplex in ONE YEAR - prior to next appointment. This test is an ultrasound of the carotid arteries in your neck. It looks at blood flow through these arteries that supply the brain with blood. Allow one hour for this exam. There are no restrictions or special instructions.   Your physician wants you to follow-up in: ONE YEAR with Dr. Debara Pickett. You will receive a reminder letter in the mail two months in advance. If you don't receive a letter, please call our office to schedule the follow-up appointment.

## 2016-07-26 NOTE — Progress Notes (Signed)
OFFICE NOTE  Chief Complaint:  Routine follow-up  Primary Care Physician: Chipper Herb, MD  HPI:  Carol [name] is a pleasant 71 year old female kindly referred to me by Dr. Laurance Flatten for cardiac evaluation. Recently she underwent lifeline screening was found to have carotid stenosis. She had a formal carotid Doppler performed at [facility] which demonstrated mild to moderate bilateral carotid artery disease. She's been adequately managed in fact is on no Vytorin and 5 fenofibrate for elevated cholesterol. Recent labs indicated an LDL-P of 1143, LDL 95, HDL 68 and triglycerides 68. Generally this is good control however given the fact that she has significant carotid artery disease, her targets for cholesterol management are much lower. We should strive for a LDL cholesterol less than 70. She's a symptomatically regards to chest pain or shortness of breath. Her brother recently died with heart attack at age 2 and of course she is appropriately concerned about that. She also has history of murmur which is been followed.  07/27/2015  Carol [name] returns today for follow-up. She seems to be doing quite well. She is physically active and walks about 3 miles several times a week. She recently had repeat carotid Dopplers which show moderate right internal carotid artery stenosis and mild left internal carotid artery stenosis which is stable compared to her studies a year ago. She also had repeat lipid testing which shows very good control at this point although triglycerides were somewhat elevated. She was started on fenofibrate in addition to her Crestor, and seems to be tolerating this well over the past month. She denies any myalgias. She is on daily aspirin. She denies any chest pain or worsening shortness of breath.  07/26/2016  Carol [name] returns for follow-up. She underwent carotid Dopplers this morning which preliminarily looks stable with a 40-59% R ICA stenosis and a 5-57% LICA stenosis.  She is asymptomatic denies any chest pain or worsening shortness of breath. There is a strong family history of carotid artery disease as well as a coronary artery disease in the family. Her PCP has been adjusting her medications as she's had side effects with the statins in the past. She was intolerant to Crestor however this was in addition of fenofibrate. She was also previously on Vytorin 10/20 mg in addition to fenofibrate 148 mg and had side effects. She also states that she felt she had side effects off of fenofibrate. Is not clear if it was just a combination of medications or the statin alone. Currently she is on pitavastatin, but she reports her medications very expensive. Her recent lipid profile 2 weeks ago showed total cholesterol 188, triglycerides 241, HDL-C 47 and LDL-C 93. Goal LDL-C less than 70. Her PCP recommended ezetimibe however she was hesitant to take it as she said it did not work in the past. She did not realize though that Vytorin contained ezetimibe.  PMHx:  Past Medical History:  Diagnosis Date  . Carotid artery stenosis   . Cataract   . Diverticulosis   . Fractured hand    right  . Hyperlipidemia   . Osteopenia   . Precancerous skin lesion 2016    Past Surgical History:  Procedure Laterality Date  . CATARACT EXTRACTION W/ INTRAOCULAR LENS IMPLANT Bilateral   . CHOLECYSTECTOMY  2009  . EYE SURGERY      FAMHx:  Family History  Problem Relation Age of Onset  . Heart disease Mother   . Heart disease Father   . Cancer Sister  breast  . Hyperlipidemia Sister   . Diabetes Sister   . Cancer Sister        bone  . Alzheimer's disease Sister   . Heart disease Brother   . CAD Brother   . Cancer Sister        breast  . Kidney disease Sister   . Heart disease Brother   . Hypertension Brother   . Alzheimer's disease Brother   . Kidney disease Brother        2018 starting dialysis    SOCHx:   reports that she has never smoked. She has never used  smokeless tobacco. She reports that she does not drink alcohol or use drugs.  ALLERGIES:  Allergies  Allergen Reactions  . Penicillins Hives  . Sulfa Antibiotics   . Sulfamethoxazole Itching  . Crestor [Rosuvastatin Calcium] Other (See Comments)    Myalgias   . Tricor [Fenofibrate]     swelling    ROS: Pertinent items noted in HPI and remainder of comprehensive ROS otherwise negative.  HOME MEDS: Current Outpatient Prescriptions  Medication Sig Dispense Refill  . aspirin EC 81 MG tablet Take 81 mg by mouth daily.    . Calcium 500-125 MG-UNIT TABS 1 tablet. 1 tablet daily.    . Cholecalciferol (VITAMIN D3) 2000 units capsule 1 capsule.    . Multiple Vitamins tablet 1 tablet. 1 tablet daily.    . OMEGA-3 1000 MG CAPS Take 3 capsules by mouth daily. 2 g daily    . Pitavastatin Calcium (LIVALO) 4 MG TABS Take 1 tablet (4 mg total) by mouth daily. 90 tablet 1   No current facility-administered medications for this visit.     LABS/IMAGING: No results found for this or any previous visit (from the past 48 hour(s)). No results found.  WEIGHTS: Wt Readings from Last 3 Encounters:  07/26/16 170 lb (77.1 kg)  06/06/16 173 lb (78.5 kg)  02/03/16 181 lb 8 oz (82.3 kg)    VITALS: BP (!) 158/76   Pulse (!) 53   Ht 5\' 6"  (1.676 m)   Wt 170 lb (77.1 kg)   BMI 27.44 kg/m   EXAM: General appearance: alert and no distress Neck: no carotid bruit and no JVD Lungs: clear to auscultation bilaterally Heart: regular rate and rhythm, S1, S2 normal and systolic murmur: early systolic 2/6, blowing at apex Abdomen: soft, non-tender; bowel sounds normal; no masses,  no organomegaly Extremities: extremities normal, atraumatic, no cyanosis or edema Pulses: 2+ and symmetric Skin: Skin color, texture, turgor normal. No rashes or lesions Neurologic: Grossly normal Psych: Pleasant  EKG: Sinus bradycardia 53  ASSESSMENT: 1. Bilateral carotid artery stenosis 2. Dyslipidemia 3. Family  history of premature coronary artery disease  PLAN: 1.   Carol [name] has stable carotid Dopplers preliminarily with a full report coming out. Her early systolic murmur is stable. Cholesterol is still not at goal. She is currently on a moderate dose statin however it is too expensive for her and she is not at goal. She will need to be on an high intensity statin in order to reach her goal. She has never tried atorvastatin in the past. I like to start her on 40 mg daily. Will discontinue pitavastatin. I would avoid 5 rates in addition to this as she's had significant myalgias in the past. If she does not tolerate this or reach goal then she's a good candidate for PCSK9 inhibitor although she is somewhat afraid of giving herself injections. We may be  able to administer those here in our lipid clinic. I will follow-up her lipid profile in 3 months.  Pixie Casino, MD, [facility] Attending Cardiologist [place] Apphia Cropley 07/26/2016, 9:43 AM

## 2016-08-23 DIAGNOSIS — R69 Illness, unspecified: Secondary | ICD-10-CM | POA: Diagnosis not present

## 2016-08-25 ENCOUNTER — Ambulatory Visit: Payer: Medicare HMO | Admitting: Physical Therapy

## 2016-09-08 DIAGNOSIS — H524 Presbyopia: Secondary | ICD-10-CM | POA: Diagnosis not present

## 2016-09-08 DIAGNOSIS — Z961 Presence of intraocular lens: Secondary | ICD-10-CM | POA: Diagnosis not present

## 2016-09-13 NOTE — Telephone Encounter (Signed)
na

## 2016-09-15 ENCOUNTER — Ambulatory Visit: Payer: Medicare HMO | Admitting: Physical Therapy

## 2016-10-24 DIAGNOSIS — E785 Hyperlipidemia, unspecified: Secondary | ICD-10-CM | POA: Diagnosis not present

## 2016-10-24 LAB — LIPID PANEL
CHOL/HDL RATIO: 3.7 ratio (ref 0.0–4.4)
CHOLESTEROL TOTAL: 179 mg/dL (ref 100–199)
HDL: 48 mg/dL (ref >39)
LDL CALC: 107 mg/dL — AB (ref 0–99)
TRIGLYCERIDES: 120 mg/dL (ref 0–149)
VLDL CHOLESTEROL CAL: 24 mg/dL (ref 5–40)

## 2016-10-25 ENCOUNTER — Telehealth: Payer: Self-pay | Admitting: *Deleted

## 2016-10-25 ENCOUNTER — Telehealth: Payer: Self-pay

## 2016-10-25 NOTE — Telephone Encounter (Signed)
Received walk in sheet from yesterday.Patient had lab work done and requested a call.Patient was called she stated since she started taking Atorvastatin 40 mg daily she has been having loose foul smelling stools.Advised I will send message to Dr.Hilty for advice.

## 2016-10-25 NOTE — Telephone Encounter (Signed)
Lipid clinic appt made for tomorrow am.

## 2016-10-25 NOTE — Telephone Encounter (Signed)
That could be a side effect, but is uncommon. I would advise she stop taking the medicine for 2 weeks to see if it resolves and then restart it. If the symptoms come back - we will need to switch.  DR. Lemmie Evens

## 2016-10-25 NOTE — Telephone Encounter (Signed)
Left message for patient to call and schedule Lipid Clinic appointment ordered by Dr. Debara Pickett

## 2016-10-25 NOTE — Progress Notes (Signed)
Patient ID: TUNISIA LANDGREBE                 DOB: June 27, 1945                    MRN: [id number]     [id number] C Witthuhn is a 71 y.o. female patient referred to lipid clinic by [facility]. PMH is significant for carotid stenosis with 40-59% RICA stenosis and 2-87% LICA stenosis , diverticulosis and hyperlipidemia.  Patient has a strong family history of coronary artery disease. Noted, previous intolerance to statins and fibrates. Patient presents today for potential PCSK9i initiation.  Patient expressed concerns about multiple medication changes in the last few months and recent adverse reactions developed with new therapy. She don't understand reasons to change her therapy from Vytorin 10/20 plus Tricor to rosuvastatin, pitavastatin ,then atorvastatin.   Current Medications:  Omega-3 fatty acids 3 grams daily - not taking at this time  Intolerances:  Atorvastatin 40mg  daily - diarrhea Vytorin 10/20 daily - lack therapeutic reposonse Pitavastatin 4mg  daily - unable to afford (last therapeutic response) Rosuvastatin 40mg  - severe muscle pain  LDL goal:  70mg /dL  Diet: weight watchers - since February; lost of fish   Exercise: 3-5 days per week; yard work; gardening  Family History: brother and sister had carotic stenting; grandparents and mother MI  Social History: denies alcohol intake, never smoked  Labs: CHO 179; TG 120; HDL 48; LDL-c 107 (atorvastatin 40mg  daily) 10/25/2016            CHO 188; TG 241; HDL 47; LDL-c 93 (livalo 4mg  daily) 07/10/2016  Past Medical History:  Diagnosis Date  . Carotid artery stenosis   . Cataract   . Diverticulosis   . Fractured hand    right  . Hyperlipidemia   . Osteopenia   . Precancerous skin lesion 2016    Current Outpatient Prescriptions on File Prior to Visit  Medication Sig Dispense Refill  . aspirin EC 81 MG tablet Take 81 mg by mouth daily.    . Calcium 500-125 MG-UNIT TABS 1 tablet. 1 tablet daily.    . Cholecalciferol (VITAMIN D3) 2000  units capsule 1 capsule.    . Multiple Vitamins tablet 1 tablet. 1 tablet daily.    . OMEGA-3 1000 MG CAPS Take 3 capsules by mouth daily. 2 g daily     No current facility-administered medications on file prior to visit.     Allergies  Allergen Reactions  . Penicillins Hives  . Sulfa Antibiotics   . Sulfamethoxazole Itching  . Lipitor [Atorvastatin] Diarrhea  . Crestor [Rosuvastatin Calcium] Other (See Comments)    Myalgias     Hyperlipidemia LDL goal <70 LDL remains above goal of 70mg /dL fir patient with ASCVD. Significant amount of time was spent explaining to patient the need to reduce triglycerides and LDL with special emphasis on LDL goal of < 70mg /dL due to 40-59% RICA stenosis.   Indication, administration, storage, common side effect, cost and available patient assistance program for Repatha and Praleunt were discussed with patient during this office visit. She refuses PCSK9i for now due to needle phobia, and remains reluctant to trial with rosuvastatin low dose or Vytorin 10/80mg .  Patient agreeable to initiate Vytorin 10/40 daily and to resume omega-3 fish oil at 2grams per day. Plan to repeat fasting blood work in 6 weeks to re-assess lipids and LFTs. Will continue to monitor and discuss PCSK9i again if LDL remains above goal after 2 months of Vytorin 10/40  plus Omega-3.    Kimberlyann Hollar Rodriguez-Guzman PharmD, BCPS, [place] [phone] 10/26/2016 8:48 PM

## 2016-10-25 NOTE — Telephone Encounter (Signed)
Tricor/fenofibrate is used to lower triglycerides.  Hers are WNL, so not reasonable to restart.  Go ahead and schedule lipid clinic appointmetn

## 2016-10-25 NOTE — Telephone Encounter (Signed)
Returned call to patient Dr.Hilty's recommendations given.Advised to call back if needed. 

## 2016-10-25 NOTE — Addendum Note (Signed)
Addended by: Theodore Demark on: 10/25/2016 11:44 AM   Modules accepted: Orders

## 2016-10-25 NOTE — Telephone Encounter (Signed)
Spoke w patient, as we got results back of labwork.  "Notes recorded by Pixie Casino, MD on 10/25/2016 at 10:42 AM EDT Cholesterol is not much improved on atorvastatin. She reported loose stools. Will likely discontinue it and refer for PCSK9 therapy.  Dr. Lemmie Evens --------------------------------- Discussed Dr. Lysbeth Penner recommendations. Pt amenable to this plan, aware to discontinue lipitor & that I will contact schedulers to arrange lipid clinic appt.  Of note, she also states she is NOT allergic to tricor/fenofibrate so would like to start this medication if reasonable. Will cc pharmD for FYI.

## 2016-10-26 ENCOUNTER — Ambulatory Visit (INDEPENDENT_AMBULATORY_CARE_PROVIDER_SITE_OTHER): Payer: Medicare HMO | Admitting: Pharmacist

## 2016-10-26 DIAGNOSIS — E785 Hyperlipidemia, unspecified: Secondary | ICD-10-CM

## 2016-10-26 MED ORDER — EZETIMIBE-SIMVASTATIN 10-40 MG PO TABS: 1.0000 | ORAL_TABLET | Freq: Every day | ORAL | 1 refills | Status: DC

## 2016-10-26 NOTE — Patient Instructions (Addendum)
Lipid Clinic [phone] Carol [name]/Kristin  *START Vytorin 10/40 daily *Resume fish-oil 2 gram daily *Repeat Blood work in 6 weeks  Cholesterol Cholesterol is a fat. Your body needs a small amount of cholesterol. Cholesterol (plaque) may build up in your blood vessels (arteries). That makes you more likely to have a heart attack or stroke. You cannot feel your cholesterol level. Having a blood test is the only way to find out if your level is high. Keep your test results. Work with your doctor to keep your cholesterol at a good level. What do the results mean?  Total cholesterol is how much cholesterol is in your blood.  LDL is bad cholesterol. This is the type that can build up. Try to have low LDL.  HDL is good cholesterol. It cleans your blood vessels and carries LDL away. Try to have high HDL.  Triglycerides are fat that the body can store or burn for energy. What are good levels of cholesterol?  Total cholesterol below 200.  LDL below 100 is good for people who have health risks. LDL below 70 is good for people who have very high risks.  HDL above 40 is good. It is best to have HDL of 60 or higher.  Triglycerides below 150. How can I lower my cholesterol? Diet Follow your diet program as told by your doctor.  Choose fish, white meat chicken, or [place] that is roasted or baked. Try not to eat red meat, fried foods, sausage, or lunch meats.  Eat lots of fresh fruits and vegetables.  Choose whole grains, beans, pasta, potatoes, and cereals.  Choose olive oil, corn oil, or canola oil. Only use small amounts.  Try not to eat butter, mayonnaise, shortening, or palm kernel oils.  Try not to eat foods with trans fats.  Choose low-fat or nonfat dairy foods. ? Drink skim or nonfat milk. ? Eat low-fat or nonfat yogurt and cheeses. ? Try not to drink whole milk or cream. ? Try not to eat ice cream, egg yolks, or full-fat cheeses.  Healthy desserts include angel food cake,  ginger snaps, animal crackers, hard candy, popsicles, and low-fat or nonfat frozen yogurt. Try not to eat pastries, cakes, pies, and cookies.  Exercise Follow your exercise program as told by your doctor.  Be more active. Try gardening, walking, and taking the stairs.  Ask your doctor about ways that you can be more active.  Medicine  Take over-the-counter and prescription medicines only as told by your doctor.  This information is not intended to replace advice given to you by your health care provider. Make sure you discuss any questions you have with your health care provider. Document Released: 05/12/2008 Document Revised: 09/15/2015 Document Reviewed: 08/26/2015 Elsevier Interactive Patient Education  2017 [employer].

## 2016-10-26 NOTE — Assessment & Plan Note (Addendum)
LDL remains above goal of 70mg /dL fir patient with ASCVD. Significant amount of time was spent explaining to patient the need to reduce triglycerides and LDL with special emphasis on LDL goal of < 70mg /dL due to 40-59% RICA stenosis.   Indication, administration, storage, common side effect, cost and available patient assistance program for Repatha and Praleunt were discussed with patient during this office visit. She refuses PCSK9i for now due to needle phobia, and remains reluctant to trial with rosuvastatin low dose or Vytorin 10/80mg .  Patient agreeable to initiate Vytorin 10/40 daily and to resume omega-3 fish oil at 2grams per day. Plan to repeat fasting blood work in 6 weeks to re-assess lipids and LFTs. Will continue to monitor and discuss PCSK9i again if LDL remains above goal after 2 months of Vytorin 10/40 plus Omega-3.

## 2016-11-06 ENCOUNTER — Telehealth: Payer: Self-pay | Admitting: Pharmacist

## 2016-11-06 NOTE — Telephone Encounter (Signed)
Patient having problems with insurance at the moment and unable to afford Vytorin for now. Will provide 3 weeks samples of Livalo, then patient should be able to try Vytorin 10/40. Will call clinic if additional problems.   Medication Samples have been provided to the patient.  Drug name: Livalo     Strength: 4mg  Qty: 21   LOT: [id number]  Exp.Date: 07/2018  Dosing instructions: 1 tablet daily  The patient has been instructed regarding the correct time, dose, and frequency of taking this medication, including desired effects and most common side effects.   Holiday Mcmenamin Rodriguez-Guzman PharmD, BCPS, [place] [id number] 11/06/2016 10:18 AM

## 2016-11-17 ENCOUNTER — Encounter: Payer: Self-pay | Admitting: *Deleted

## 2016-11-22 ENCOUNTER — Telehealth: Payer: Self-pay | Admitting: Pharmacist

## 2016-11-22 DIAGNOSIS — E785 Hyperlipidemia, unspecified: Secondary | ICD-10-CM

## 2016-11-22 NOTE — Telephone Encounter (Signed)
LMOM; taking Vytorin 10/40 ?? .  Need to repeat lipid panel in 2 weeks if taking Vytorin  Lipid and LFTs order in Epic.

## 2016-11-24 ENCOUNTER — Encounter: Payer: Self-pay | Admitting: *Deleted

## 2016-11-27 NOTE — Telephone Encounter (Signed)
LMOM for patient to call back.

## 2016-11-28 ENCOUNTER — Telehealth: Payer: Self-pay | Admitting: Internal Medicine

## 2016-11-28 NOTE — Telephone Encounter (Signed)
New Message ° ° pt verbalized that she is returning call for rn °

## 2016-11-29 NOTE — Telephone Encounter (Signed)
Tried livalo but worsen GI symptoms.  Tolerating Vytorin 10-40 daily for 3 days so far.  Will repeat lipid panel in November with PCP.

## 2016-12-04 ENCOUNTER — Other Ambulatory Visit: Payer: Self-pay

## 2016-12-04 MED ORDER — EZETIMIBE-SIMVASTATIN 10-40 MG PO TABS: 1.0000 | ORAL_TABLET | Freq: Every day | ORAL | 3 refills | Status: DC

## 2016-12-06 ENCOUNTER — Ambulatory Visit: Payer: Medicare HMO | Admitting: Family Medicine

## 2016-12-21 ENCOUNTER — Other Ambulatory Visit: Payer: Medicare HMO

## 2016-12-21 DIAGNOSIS — E782 Mixed hyperlipidemia: Secondary | ICD-10-CM

## 2016-12-22 LAB — CBC WITH DIFFERENTIAL/PLATELET
BASOS ABS: 0 10*3/uL (ref 0.0–0.2)
Basos: 1 %
EOS (ABSOLUTE): 0.3 10*3/uL (ref 0.0–0.4)
EOS: 4 %
HEMATOCRIT: 40.7 % (ref 34.0–46.6)
Hemoglobin: 13.2 g/dL (ref 11.1–15.9)
Immature Grans (Abs): 0 10*3/uL (ref 0.0–0.1)
Immature Granulocytes: 0 %
LYMPHS ABS: 2.6 10*3/uL (ref 0.7–3.1)
Lymphs: 40 %
MCH: 29.8 pg (ref 26.6–33.0)
MCHC: 32.4 g/dL (ref 31.5–35.7)
MCV: 92 fL (ref 79–97)
MONOS ABS: 0.4 10*3/uL (ref 0.1–0.9)
Monocytes: 7 %
Neutrophils Absolute: 3.2 10*3/uL (ref 1.4–7.0)
Neutrophils: 48 %
PLATELETS: 191 10*3/uL (ref 150–379)
RBC: 4.43 x10E6/uL (ref 3.77–5.28)
RDW: 13.5 % (ref 12.3–15.4)
WBC: 6.6 10*3/uL (ref 3.4–10.8)

## 2016-12-22 LAB — NMR, LIPOPROFILE
Cholesterol: 199 mg/dL (ref 100–199)
HDL Cholesterol by NMR: 47 mg/dL (ref >39)
HDL PARTICLE NUMBER: 36.2 umol/L (ref >=30.5)
LDL Particle Number: 1455 nmol/L — ABNORMAL HIGH (ref <1000)
LDL SIZE: 21 nm (ref >20.5)
LDL-C: 100 mg/dL — ABNORMAL HIGH (ref 0–99)
LP-IR Score: 56 — ABNORMAL HIGH (ref <=45)
Small LDL Particle Number: 673 nmol/L — ABNORMAL HIGH (ref <=527)
Triglycerides by NMR: 260 mg/dL — ABNORMAL HIGH (ref 0–149)

## 2016-12-22 LAB — BMP8+EGFR
BUN/Creatinine Ratio: 16 (ref 12–28)
BUN: 14 mg/dL (ref 8–27)
CALCIUM: 9.1 mg/dL (ref 8.7–10.3)
CO2: 28 mmol/L (ref 20–29)
CREATININE: 0.89 mg/dL (ref 0.57–1.00)
Chloride: 107 mmol/L — ABNORMAL HIGH (ref 96–106)
GFR, EST AFRICAN AMERICAN: 74 mL/min/{1.73_m2} (ref >59)
GFR, EST NON AFRICAN AMERICAN: 65 mL/min/{1.73_m2} (ref >59)
Glucose: 83 mg/dL (ref 65–99)
Potassium: 4.6 mmol/L (ref 3.5–5.2)
Sodium: 148 mmol/L — ABNORMAL HIGH (ref 134–144)

## 2016-12-22 LAB — HEPATIC FUNCTION PANEL
ALBUMIN: 4.3 g/dL (ref 3.5–4.8)
ALK PHOS: 69 IU/L (ref 39–117)
ALT: 18 IU/L (ref 0–32)
AST: 20 IU/L (ref 0–40)
Bilirubin Total: 0.4 mg/dL (ref 0.0–1.2)
Bilirubin, Direct: 0.11 mg/dL (ref 0.00–0.40)
Total Protein: 6.9 g/dL (ref 6.0–8.5)

## 2016-12-29 ENCOUNTER — Encounter: Payer: Self-pay | Admitting: Family Medicine

## 2016-12-29 ENCOUNTER — Ambulatory Visit (INDEPENDENT_AMBULATORY_CARE_PROVIDER_SITE_OTHER): Payer: Medicare HMO

## 2016-12-29 ENCOUNTER — Ambulatory Visit (INDEPENDENT_AMBULATORY_CARE_PROVIDER_SITE_OTHER): Payer: Medicare HMO | Admitting: Family Medicine

## 2016-12-29 ENCOUNTER — Other Ambulatory Visit: Payer: Self-pay | Admitting: Family Medicine

## 2016-12-29 VITALS — BP 128/64 | HR 54 | Temp 97.0°F | Ht 66.0 in | Wt 170.0 lb

## 2016-12-29 DIAGNOSIS — Z1231 Encounter for screening mammogram for malignant neoplasm of breast: Secondary | ICD-10-CM

## 2016-12-29 DIAGNOSIS — Z13820 Encounter for screening for osteoporosis: Secondary | ICD-10-CM | POA: Diagnosis not present

## 2016-12-29 DIAGNOSIS — Z789 Other specified health status: Secondary | ICD-10-CM

## 2016-12-29 DIAGNOSIS — Z780 Asymptomatic menopausal state: Secondary | ICD-10-CM

## 2016-12-29 DIAGNOSIS — I6523 Occlusion and stenosis of bilateral carotid arteries: Secondary | ICD-10-CM | POA: Diagnosis not present

## 2016-12-29 DIAGNOSIS — E559 Vitamin D deficiency, unspecified: Secondary | ICD-10-CM

## 2016-12-29 DIAGNOSIS — E7800 Pure hypercholesterolemia, unspecified: Secondary | ICD-10-CM

## 2016-12-29 DIAGNOSIS — E785 Hyperlipidemia, unspecified: Secondary | ICD-10-CM | POA: Diagnosis not present

## 2016-12-29 DIAGNOSIS — Z0000 Encounter for general adult medical examination without abnormal findings: Secondary | ICD-10-CM

## 2016-12-29 NOTE — Progress Notes (Signed)
Subjective:    Patient ID: Carol [name], female    DOB: [birth year], 71 y.o.   MRN: [id number]  HPI Pt here for follow up and management of chronic medical problems which includes hyperlipidemia. She is taking medication regularly.  The patient complains of myalgias in her arms and had to stop the Vytorin.  She is going to schedule for her Pap smear with 1 of the mid levels in the office and will get a DEXA scan today.  She has had lab work done and we will review this with her during office visit today.  She stopped the Vytorin 1 week before having her lab work done.  All liver function tests were normal.  The blood sugar was good at 83.  The sodium and chloride were slightly elevated and this is been the case in the past and we will continue to monitor this.  The creatinine was good.  Cholesterol numbers with advanced lipid testing were elevated with a total LDL particle number being 1455 and an LDL C being 100 and with her family history we need to get the LDL see down below 70.  Triglycerides were elevated at 260.  Good cholesterol was good at 36.2.  CBC was within normal limits with a normal 13.2.  Platelet count was adequate.  I did send a copy of her blood work results to her cardiologist and he agreed with trying the vascepa.  Patient denies any chest pain shortness of breath trouble with swallowing heartburn indigestion nausea vomiting diarrhea or blood in the stool or change in bowel habits.  She denies any trouble with passing her water.  About a week before she had her blood work the pain and muscle aches in both arms especially forearms became so bad that she felt like she had to stop the Vytorin.  At one time she did tolerate Vytorin 20 mg but when it was up to 40 she had a much more problem with this.  She is taken multiple other statins and been intolerant of those.  All of this is being done primarily because of her strong family history of heart disease.  The last vitamin D was 34.6 in  April of this year.      Patient Active Problem List   Diagnosis Date Noted  . Murmur 07/26/2016  . Carotid stenosis 07/22/2014  . Family history of heart disease 07/28/2013  . Need for prophylactic vaccination and inoculation against influenza 11/26/2012  . Diverticulosis   . Osteopenia 09/11/2012  . Hyperlipidemia LDL goal <70 07/23/2012  . H/O infectious disease 01/02/2012  . Pseudoptosis 11/07/2011   Outpatient Encounter Prescriptions as of 12/29/2016  Medication Sig  . aspirin EC 81 MG tablet Take 81 mg by mouth daily.  . Calcium 500-125 MG-UNIT TABS 1 tablet. 1 tablet daily.  . Cholecalciferol (VITAMIN D3) 2000 units capsule 1 capsule.  . Multiple Vitamins tablet 1 tablet. 1 tablet daily.  . OMEGA-3 1000 MG CAPS Take 3 capsules by mouth daily. 2 g daily  . [DISCONTINUED] ezetimibe-simvastatin (VYTORIN) 10-40 MG tablet Take 1 tablet by mouth daily.   No facility-administered encounter medications on file as of 12/29/2016.      Review of Systems  Constitutional: Negative.   HENT: Negative.   Eyes: Negative.   Respiratory: Negative.   Cardiovascular: Negative.   Gastrointestinal: Negative.   Endocrine: Negative.   Genitourinary: Negative.   Musculoskeletal: Positive for myalgias (arms ).  Skin: Negative.   Allergic/Immunologic: Negative.  Neurological: Negative.   Hematological: Negative.   Psychiatric/Behavioral: Negative.        Objective:   Physical Exam  Constitutional: She is oriented to person, place, and time. She appears well-developed and well-nourished. No distress.  The patient is alert and in good spirits today.  HENT:  Head: Normocephalic and atraumatic.  Right Ear: External ear normal.  Left Ear: External ear normal.  Nose: Nose normal.  Mouth/Throat: Oropharynx is clear and moist. No oropharyngeal exudate.  Eyes: Pupils are equal, round, and reactive to light. Conjunctivae and EOM are normal. Right eye exhibits no discharge. Left eye exhibits  no discharge. No scleral icterus.  Neck: Normal range of motion. Neck supple. No thyromegaly present.  No bruits thyromegaly or anterior cervical adenopathy  Cardiovascular: Normal rate, regular rhythm, normal heart sounds and intact distal pulses.   No murmur heard. Heart is regular at 60/min with no murmurs heard today.  Pulmonary/Chest: Effort normal and breath sounds normal. No respiratory distress. She has no wheezes. She has no rales.  Lungs are clear anteriorly and posteriorly  Abdominal: Soft. Bowel sounds are normal. She exhibits no mass. There is no tenderness. There is no rebound and no guarding.  No abdominal tenderness masses bruits or organ enlargement and no suprapubic tenderness.  Musculoskeletal: Normal range of motion. She exhibits no edema.  Lymphadenopathy:    She has no cervical adenopathy.  Neurological: She is alert and oriented to person, place, and time. She has normal reflexes. No cranial nerve deficit.  Skin: Skin is warm and dry. No rash noted.  Psychiatric: She has a normal mood and affect. Her behavior is normal. Judgment and thought content normal.  Nursing note and vitals reviewed.  BP 128/64 (BP Location: Left Arm)   Pulse (!) 54   Temp (!) 97 F (36.1 C) (Oral)   Ht 5\' 6"  (1.676 m)   Wt 170 lb (77.1 kg)   BMI 27.44 kg/m         Assessment & Plan:  1. Vitamin D deficiency -Vitamin D level in 4 weeks after taking vascepa - DG [facility] DEXA; Future  2. Pure hypercholesterolemia -The patient will take the prescription fish oil until her next visit and continue to follow aggressive therapeutic lifestyle changes  3. Health care maintenance -DEXA scan today  4. Screening for osteoporosis - DG [facility] DEXA; Future  5. Postmenopausal - DG [facility] DEXA; Future  6. Statin intolerance -Discontinue Vytorin and start vascepa  7. Hyperlipidemia LDL goal <70 -Aggressive therapeutic lifestyle changes  8. Carotid stenosis, bilateral -Periodic ultrasounds  as determined by cardiology  No orders of the defined types were placed in this encounter.  Samples of vascepa will be given to patient to take two twice daily  Patient Instructions                       Medicare Annual Wellness Visit  [facility] and the medical providers at [facility] strive to bring you the best medical care.  In doing so we not only want to address your current medical conditions and concerns but also to detect new conditions early and prevent illness, disease and health-related problems.    Medicare offers a yearly Wellness Visit which allows our clinical staff to assess your need for preventative services including immunizations, lifestyle education, counseling to decrease risk of preventable diseases and screening for fall risk and other medical concerns.    This visit is provided free of charge (  no copay) for all Medicare recipients. The clinical pharmacists at [facility] have begun to conduct these Wellness Visits which will also include a thorough review of all your medications.    As you primary medical provider recommend that you make an appointment for your Annual Wellness Visit if you have not done so already this year.  You may set up this appointment before you leave today or you may call back ([phone]) and schedule an appointment.  Please make sure when you call that you mention that you are scheduling your Annual Wellness Visit with the clinical pharmacist so that the appointment may be made for the proper length of time.     Continue current medications. Continue good therapeutic lifestyle changes which include good diet and exercise. Fall precautions discussed with patient. If an FOBT was given today- please return it to our front desk. If you are over 78 years old - you may need Prevnar 63 or the adult Pneumonia vaccine.  **Flu shots are available--- please call and schedule a FLU-CLINIC  appointment**  After your visit with [place] today you will receive a survey in the mail or online from [employer] regarding your care with [place]. Please take a moment to fill this out. Your feedback is very important to [place] as you can help [place] better understand your patient needs as well as improve your experience and satisfaction. WE CARE ABOUT YOU!!!  Continue to hold the Vytorin 10/40 Take the vascepa 2 twice daily Recheck a lipid liver panel and BMP and vitamin D level in 4 weeks fasting for follow-up and because of the elevated sodium and chloride on the recent blood work Continue to stay active and drink plenty of fluids Follow-up with cardiologist as planned Eat healthy   Arrie Senate MD

## 2016-12-29 NOTE — Patient Instructions (Addendum)
Medicare Annual Wellness Visit  [facility] and the medical providers at [facility] strive to bring you the best medical care.  In doing so we not only want to address your current medical conditions and concerns but also to detect new conditions early and prevent illness, disease and health-related problems.    Medicare offers a yearly Wellness Visit which allows our clinical staff to assess your need for preventative services including immunizations, lifestyle education, counseling to decrease risk of preventable diseases and screening for fall risk and other medical concerns.    This visit is provided free of charge (no copay) for all Medicare recipients. The clinical pharmacists at [facility] have begun to conduct these Wellness Visits which will also include a thorough review of all your medications.    As you primary medical provider recommend that you make an appointment for your Annual Wellness Visit if you have not done so already this year.  You may set up this appointment before you leave today or you may call back ([phone]) and schedule an appointment.  Please make sure when you call that you mention that you are scheduling your Annual Wellness Visit with the clinical pharmacist so that the appointment may be made for the proper length of time.     Continue current medications. Continue good therapeutic lifestyle changes which include good diet and exercise. Fall precautions discussed with patient. If an FOBT was given today- please return it to our front desk. If you are over 73 years old - you may need Prevnar 73 or the adult Pneumonia vaccine.  **Flu shots are available--- please call and schedule a FLU-CLINIC appointment**  After your visit with [place] today you will receive a survey in the mail or online from [employer] regarding your care with [place]. Please take a moment to fill this out. Your feedback is very  important to [place] as you can help [place] better understand your patient needs as well as improve your experience and satisfaction. WE CARE ABOUT YOU!!!  Continue to hold the Vytorin 10/40 Take the vascepa 2 twice daily Recheck a lipid liver panel and BMP and vitamin D level in 4 weeks fasting for follow-up and because of the elevated sodium and chloride on the recent blood work Continue to stay active and drink plenty of fluids Follow-up with cardiologist as planned Eat healthy

## 2016-12-29 NOTE — Addendum Note (Signed)
Addended by: Nigel Berthold C on: 12/29/2016 04:04 PM   Modules accepted: Orders

## 2017-01-08 ENCOUNTER — Telehealth: Payer: Self-pay | Admitting: Pharmacist

## 2017-01-08 ENCOUNTER — Other Ambulatory Visit: Payer: Self-pay | Admitting: Family Medicine

## 2017-01-08 NOTE — Telephone Encounter (Signed)
Pt notified samples are ready for pick-up.  

## 2017-01-08 NOTE — Telephone Encounter (Signed)
LMOM; patient to call back.  Need to verify if patient taking (and tolerating) Vytorin and omega-3 at this time.  We discussed options for Lovaza, Vascepa and PCSK9i in the past.  Will initiate insurance approval process if patient agreeable

## 2017-01-08 NOTE — Telephone Encounter (Signed)
-----   Message from Pixie Casino, MD sent at 12/26/2016  5:18 PM EDT ----- Agree with the plan to start Vascepa - goal dose would be 2G BID - this could be significantly helpful to her lipid profile. Her LDL, will not likely reach goal, however. We cannot increase vytorin to 10/80 due to FDA limitations on the dose of simvastatin to <80 mg. Her only other option (which is a good one), would be a PCSK9 inhibitor. I understand she is afraid of needles, however, she may tolerate the Repatha infusion pump.  -Dr. Debara Pickett  ----- Message ----- From: Chipper Herb, MD Sent: 12/24/2016   8:46 PM To: Pixie Casino, MD, [facility] Clinical Pool A  Cholesterol numbers with advanced lipid testing have a total LDL particle number that remains increased at 1455.  The LDL C is elevated at 100.  Triglycerides are very elevated at 260.  Good cholesterol or the HDL particle number is good.  She is intolerant to Crestor and Lipitor.  She also has a strong family history of heart disease.  We have a couple of options here.  We could increase the Vytorin to 10/80 or we could try her on Vascepa 1 g, 2 twice daily.  I would prefer the second option first and then recheck a lipid liver panel in 6 weeks.  Continue with as aggressive therapeutic lifestyle changes as possible which include diet and exercise Please call the patient with these results and recommendations as well as with results with recommendations that have already been reviewed

## 2017-01-10 NOTE — Telephone Encounter (Signed)
LMOM; patient to call back 

## 2017-01-10 NOTE — Telephone Encounter (Signed)
LMOM (2nd attempt);  Patient to call back. Need to discuss recent Lipid panel results and alternative therapy for cholesterol management.

## 2017-01-11 NOTE — Telephone Encounter (Signed)
Talked to patient today.  She was unable to tolerate Vytorin, Crestor or Lipitor.  Currently on Vascepa 2 grams twice daily as recommended by Dr Debara Pickett. Patient is NOT ready to start any injectable (auto-injector or infusion pump).  Carol [name] plans to follow up with DR Laurance Flatten in December to assess response to Vascepa, then contact lipid clinic for injectable if she "feels" ready for it.

## 2017-01-15 ENCOUNTER — Ambulatory Visit (HOSPITAL_COMMUNITY)
Admission: RE | Admit: 2017-01-15 | Discharge: 2017-01-15 | Disposition: A | Discharge status: Home / Self Care | Payer: Medicare HMO | Source: Ambulatory Visit | Attending: Family Medicine | Admitting: Family Medicine

## 2017-01-15 DIAGNOSIS — Z1231 Encounter for screening mammogram for malignant neoplasm of breast: Secondary | ICD-10-CM | POA: Insufficient documentation

## 2017-01-22 ENCOUNTER — Telehealth: Payer: Self-pay | Admitting: Family Medicine

## 2017-01-22 NOTE — Telephone Encounter (Signed)
PAtient would like samples of vascepa 1 mg

## 2017-01-22 NOTE — Telephone Encounter (Signed)
Pt aware samples up front and ready to pick up.

## 2017-02-01 ENCOUNTER — Other Ambulatory Visit: Payer: Medicare HMO

## 2017-02-01 DIAGNOSIS — Z0000 Encounter for general adult medical examination without abnormal findings: Secondary | ICD-10-CM | POA: Diagnosis not present

## 2017-02-01 DIAGNOSIS — E559 Vitamin D deficiency, unspecified: Secondary | ICD-10-CM

## 2017-02-01 DIAGNOSIS — E7800 Pure hypercholesterolemia, unspecified: Secondary | ICD-10-CM | POA: Diagnosis not present

## 2017-02-01 DIAGNOSIS — E785 Hyperlipidemia, unspecified: Secondary | ICD-10-CM | POA: Diagnosis not present

## 2017-02-01 DIAGNOSIS — I6523 Occlusion and stenosis of bilateral carotid arteries: Secondary | ICD-10-CM | POA: Diagnosis not present

## 2017-02-02 LAB — BMP8+EGFR
BUN / CREAT RATIO: 17 (ref 12–28)
BUN: 17 mg/dL (ref 8–27)
CHLORIDE: 106 mmol/L (ref 96–106)
CO2: 24 mmol/L (ref 20–29)
Calcium: 8.9 mg/dL (ref 8.7–10.3)
Creatinine, Ser: 0.98 mg/dL (ref 0.57–1.00)
GFR, EST AFRICAN AMERICAN: 66 mL/min/{1.73_m2} (ref >59)
GFR, EST NON AFRICAN AMERICAN: 57 mL/min/{1.73_m2} — AB (ref >59)
Glucose: 97 mg/dL (ref 65–99)
POTASSIUM: 4.1 mmol/L (ref 3.5–5.2)
Sodium: 144 mmol/L (ref 134–144)

## 2017-02-02 LAB — HEPATIC FUNCTION PANEL
ALT: 10 IU/L (ref 0–32)
AST: 14 IU/L (ref 0–40)
Albumin: 4.2 g/dL (ref 3.5–4.8)
Alkaline Phosphatase: 61 IU/L (ref 39–117)
BILIRUBIN TOTAL: 0.7 mg/dL (ref 0.0–1.2)
BILIRUBIN, DIRECT: 0.15 mg/dL (ref 0.00–0.40)
Total Protein: 6.9 g/dL (ref 6.0–8.5)

## 2017-02-02 LAB — LIPID PANEL
CHOL/HDL RATIO: 5.8 ratio — AB (ref 0.0–4.4)
CHOLESTEROL TOTAL: 272 mg/dL — AB (ref 100–199)
HDL: 47 mg/dL (ref >39)
LDL Calculated: 192 mg/dL — ABNORMAL HIGH (ref 0–99)
Triglycerides: 163 mg/dL — ABNORMAL HIGH (ref 0–149)
VLDL CHOLESTEROL CAL: 33 mg/dL (ref 5–40)

## 2017-02-02 LAB — VITAMIN D 25 HYDROXY (VIT D DEFICIENCY, FRACTURES): VIT D 25 HYDROXY: 34.5 ng/mL (ref 30.0–100.0)

## 2017-02-06 ENCOUNTER — Ambulatory Visit: Payer: 59 | Admitting: *Deleted

## 2017-02-07 ENCOUNTER — Encounter: Payer: Self-pay | Admitting: Family Medicine

## 2017-02-07 ENCOUNTER — Other Ambulatory Visit: Payer: Self-pay | Admitting: *Deleted

## 2017-02-07 ENCOUNTER — Ambulatory Visit (INDEPENDENT_AMBULATORY_CARE_PROVIDER_SITE_OTHER): Payer: Medicare HMO | Admitting: Family Medicine

## 2017-02-07 VITALS — BP 133/82 | HR 70 | Temp 97.9°F | Ht 66.0 in | Wt 173.0 lb

## 2017-02-07 DIAGNOSIS — J00 Acute nasopharyngitis [common cold]: Secondary | ICD-10-CM | POA: Diagnosis not present

## 2017-02-07 MED ORDER — ROSUVASTATIN CALCIUM 5 MG PO TABS: 2.5000 mg | ORAL_TABLET | ORAL | 1 refills | Status: DC

## 2017-02-07 MED ORDER — LORATADINE 10 MG PO TABS: 10.0000 mg | ORAL_TABLET | Freq: Every day | ORAL | 0 refills | Status: DC

## 2017-02-07 NOTE — Progress Notes (Signed)
Subjective: CC: Sinus concern PCP: Chipper Herb, MD WJX:BJYNWG C Bresee is a 71 y.o. female presenting to clinic today for:  1. Sinus symptoms  Patient reports rhinorrhea that started yesterday evening after being without power/ heat x2 days. Denies cough, hemoptysis, congestion, sinus pressure, headache, SOB, dizziness, rash, nausea, vomiting, diarrhea, fevers, chills, myalgia, sick contacts, recent travel.  Patient has used Tylenol 500mg  with little relief of symptoms.  Denies history of COPD or asthma.  Denies tobacco use/ exposure.  She notes that she wanted to be checked out to make sure that she was not infectious, as she is planning on going to reunion dinner tonight and has plans to go to a graduation on Saturday.   ROS: Per HPI  Allergies  Allergen Reactions  . Penicillins Hives  . Sulfa Antibiotics   . Sulfamethoxazole Itching  . Lipitor [Atorvastatin] Diarrhea  . Crestor [Rosuvastatin Calcium] Other (See Comments)    Myalgias    Past Medical History:  Diagnosis Date  . Carotid artery stenosis   . Cataract   . Diverticulosis   . Fractured hand    right  . Hyperlipidemia   . Osteopenia   . Precancerous skin lesion 2016    Current Outpatient Medications:  .  aspirin EC 81 MG tablet, Take 81 mg by mouth daily., Disp: , Rfl:  .  Calcium 500-125 MG-UNIT TABS, 1 tablet. 1 tablet daily., Disp: , Rfl:  .  Cholecalciferol (VITAMIN D3) 2000 units capsule, 1 capsule., Disp: , Rfl:  .  Icosapent Ethyl 1 g CAPS, Take 2 g 2 (two) times daily by mouth., Disp: , Rfl:  .  Multiple Vitamins tablet, 1 tablet. 1 tablet daily., Disp: , Rfl:  Social History   Socioeconomic History  . Marital status: Widowed    Spouse name: Not on file  . Number of children: Not on file  . Years of education: Not on file  . Highest education level: Not on file  Social Needs  . Financial resource strain: Not on file  . Food insecurity - worry: Not on file  . Food insecurity - inability: Not  on file  . Transportation needs - medical: Not on file  . Transportation needs - non-medical: Not on file  Occupational History  . Not on file  Tobacco Use  . Smoking status: Never Smoker  . Smokeless tobacco: Never Used  Substance and Sexual Activity  . Alcohol use: No  . Drug use: No  . Sexual activity: No  Other Topics Concern  . Not on file  Social History Narrative  . Not on file   Family History  Problem Relation Age of Onset  . Heart disease Mother   . Heart disease Father   . Cancer Sister        breast  . Hyperlipidemia Sister   . Diabetes Sister   . Cancer Sister        bone  . Alzheimer's disease Sister   . Heart disease Brother   . CAD Brother   . Cancer Sister        breast  . Kidney disease Sister   . Heart disease Brother   . Hypertension Brother   . Alzheimer's disease Brother   . Kidney disease Brother        2018 starting dialysis    Objective: Office vital signs reviewed. BP 133/82   Pulse 70   Temp 97.9 F (36.6 C) (Oral)   Ht 5\' 6"  (1.676 m)  Wt 173 lb (78.5 kg)   BMI 27.92 kg/m   Physical Examination:  General: Awake, alert, well nourished, well appearing female, No acute distress HEENT: Normal    Neck: No masses palpated. No lymphadenopathy    Ears: Tympanic membranes intact, normal light reflex, no erythema, no bulging (wears hearing aids)    Eyes: PERRLA, extraocular membranes intact, sclera white    Nose: nasal turbinates moist, no nasal discharge    Throat: moist mucus membranes, no erythema, no tonsillar exudate.  Airway is patent Cardio: regular rate and rhythm, S1S2 heard, no murmurs appreciated Pulm: clear to auscultation bilaterally, no wheezes, rhonchi or rales; normal work of breathing on room air  Assessment/ Plan: 71 y.o. female   1. Acute rhinitis No evidence of infection on today's exam.  Patient is afebrile with normal vital signs.  I suspect that she is having rhinorrhea secondary to vasoconstriction in the  naris after having been exposed to cold weather.  I recommended that she consider Claritin 10 mg p.o. daily as needed rhinorrhea.  Home care instructions were reviewed with the patient.  Return precautions and reasons for emergent evaluation in the emergency department review with patient.  They voiced understanding and will follow-up as needed.  Meds ordered this encounter  Medications  . loratadine (CLARITIN) 10 MG tablet    Sig: Take 1 tablet (10 mg total) by mouth daily.    Dispense:  30 tablet    Refill:  [place], DO [place] [phone]

## 2017-02-07 NOTE — Patient Instructions (Signed)
Your sinus drainage does not appear to be infectious.  I suspect that you are having drainage because of recent cold air exposure.  I have sent in Claritin 10 mg free to take 1 tablet daily.  This should start drying things up for you.  Use it as long as you need it.  It does not cause sedation nor should interact with any of your out of their medicines.  Make sure to drink plenty of water.  If you develop any of the signs and symptoms that we discussed of infection, please contact our office.  Otherwise, enjoy your plans as scheduled.

## 2017-03-21 DIAGNOSIS — R69 Illness, unspecified: Secondary | ICD-10-CM | POA: Diagnosis not present

## 2017-04-12 DIAGNOSIS — Z803 Family history of malignant neoplasm of breast: Secondary | ICD-10-CM | POA: Diagnosis not present

## 2017-04-12 DIAGNOSIS — Z8249 Family history of ischemic heart disease and other diseases of the circulatory system: Secondary | ICD-10-CM | POA: Diagnosis not present

## 2017-04-12 DIAGNOSIS — E785 Hyperlipidemia, unspecified: Secondary | ICD-10-CM | POA: Diagnosis not present

## 2017-04-12 DIAGNOSIS — Z7982 Long term (current) use of aspirin: Secondary | ICD-10-CM | POA: Diagnosis not present

## 2017-04-12 DIAGNOSIS — Z833 Family history of diabetes mellitus: Secondary | ICD-10-CM | POA: Diagnosis not present

## 2017-04-12 DIAGNOSIS — R030 Elevated blood-pressure reading, without diagnosis of hypertension: Secondary | ICD-10-CM | POA: Diagnosis not present

## 2017-04-12 DIAGNOSIS — Z880 Allergy status to penicillin: Secondary | ICD-10-CM | POA: Diagnosis not present

## 2017-05-01 ENCOUNTER — Other Ambulatory Visit: Payer: Medicare HMO

## 2017-05-01 DIAGNOSIS — E785 Hyperlipidemia, unspecified: Secondary | ICD-10-CM | POA: Diagnosis not present

## 2017-05-01 DIAGNOSIS — E7800 Pure hypercholesterolemia, unspecified: Secondary | ICD-10-CM | POA: Diagnosis not present

## 2017-05-01 DIAGNOSIS — E559 Vitamin D deficiency, unspecified: Secondary | ICD-10-CM | POA: Diagnosis not present

## 2017-05-02 ENCOUNTER — Ambulatory Visit (INDEPENDENT_AMBULATORY_CARE_PROVIDER_SITE_OTHER): Payer: Medicare HMO | Admitting: Family Medicine

## 2017-05-02 ENCOUNTER — Encounter: Payer: Self-pay | Admitting: Family Medicine

## 2017-05-02 VITALS — BP 129/67 | HR 58 | Temp 98.4°F | Ht 66.0 in | Wt 173.0 lb

## 2017-05-02 DIAGNOSIS — E7800 Pure hypercholesterolemia, unspecified: Secondary | ICD-10-CM

## 2017-05-02 DIAGNOSIS — H6123 Impacted cerumen, bilateral: Secondary | ICD-10-CM | POA: Diagnosis not present

## 2017-05-02 DIAGNOSIS — Z8249 Family history of ischemic heart disease and other diseases of the circulatory system: Secondary | ICD-10-CM | POA: Diagnosis not present

## 2017-05-02 DIAGNOSIS — E559 Vitamin D deficiency, unspecified: Secondary | ICD-10-CM | POA: Diagnosis not present

## 2017-05-02 DIAGNOSIS — I6523 Occlusion and stenosis of bilateral carotid arteries: Secondary | ICD-10-CM | POA: Diagnosis not present

## 2017-05-02 DIAGNOSIS — Z789 Other specified health status: Secondary | ICD-10-CM

## 2017-05-02 LAB — NMR, LIPOPROFILE
Cholesterol, Total: 230 mg/dL — ABNORMAL HIGH (ref 100–199)
HDL PARTICLE NUMBER: 33.2 umol/L (ref >=30.5)
HDL-C: 54 mg/dL (ref >39)
LDL Particle Number: 1634 nmol/L — ABNORMAL HIGH (ref <1000)
LDL Size: 20.9 nm (ref >20.5)
LDL-C: 148 mg/dL — AB (ref 0–99)
LP-IR Score: 31 (ref <=45)
SMALL LDL PARTICLE NUMBER: 487 nmol/L (ref <=527)
Triglycerides: 140 mg/dL (ref 0–149)

## 2017-05-02 LAB — BMP8+EGFR
BUN / CREAT RATIO: 23 (ref 12–28)
BUN: 22 mg/dL (ref 8–27)
CO2: 27 mmol/L (ref 20–29)
CREATININE: 0.94 mg/dL (ref 0.57–1.00)
Calcium: 9.7 mg/dL (ref 8.7–10.3)
Chloride: 107 mmol/L — ABNORMAL HIGH (ref 96–106)
GFR calc Af Amer: 70 mL/min/{1.73_m2} (ref >59)
GFR calc non Af Amer: 60 mL/min/{1.73_m2} (ref >59)
GLUCOSE: 92 mg/dL (ref 65–99)
POTASSIUM: 5.1 mmol/L (ref 3.5–5.2)
SODIUM: 147 mmol/L — AB (ref 134–144)

## 2017-05-02 LAB — CBC WITH DIFFERENTIAL/PLATELET
Basophils Absolute: 0 10*3/uL (ref 0.0–0.2)
Basos: 0 %
EOS (ABSOLUTE): 0.2 10*3/uL (ref 0.0–0.4)
EOS: 3 %
HEMATOCRIT: 40.5 % (ref 34.0–46.6)
Hemoglobin: 13.7 g/dL (ref 11.1–15.9)
Immature Grans (Abs): 0 10*3/uL (ref 0.0–0.1)
Immature Granulocytes: 0 %
LYMPHS ABS: 2.6 10*3/uL (ref 0.7–3.1)
Lymphs: 43 %
MCH: 29.9 pg (ref 26.6–33.0)
MCHC: 33.8 g/dL (ref 31.5–35.7)
MCV: 88 fL (ref 79–97)
MONOS ABS: 0.4 10*3/uL (ref 0.1–0.9)
Monocytes: 6 %
Neutrophils Absolute: 2.8 10*3/uL (ref 1.4–7.0)
Neutrophils: 48 %
Platelets: 183 10*3/uL (ref 150–379)
RBC: 4.58 x10E6/uL (ref 3.77–5.28)
RDW: 14 % (ref 12.3–15.4)
WBC: 5.9 10*3/uL (ref 3.4–10.8)

## 2017-05-02 LAB — HEPATIC FUNCTION PANEL
ALT: 11 IU/L (ref 0–32)
AST: 12 IU/L (ref 0–40)
Albumin: 4.5 g/dL (ref 3.5–4.8)
Alkaline Phosphatase: 59 IU/L (ref 39–117)
Bilirubin Total: 1 mg/dL (ref 0.0–1.2)
Bilirubin, Direct: 0.22 mg/dL (ref 0.00–0.40)
TOTAL PROTEIN: 7.2 g/dL (ref 6.0–8.5)

## 2017-05-02 LAB — VITAMIN D 25 HYDROXY (VIT D DEFICIENCY, FRACTURES): VIT D 25 HYDROXY: 36.2 ng/mL (ref 30.0–100.0)

## 2017-05-02 MED ORDER — ICOSAPENT ETHYL 1 G PO CAPS: 2.0000 g | ORAL_CAPSULE | Freq: Two times a day (BID) | ORAL | 3 refills | Status: DC

## 2017-05-02 NOTE — Progress Notes (Signed)
Subjective:    Patient ID: Carol [name], female    DOB: [birth year], 72 y.o.   MRN: [id number]  HPI Pt here for follow up and management of chronic medical problems which includes hyperlipidemia. She is taking medication regularly.  This patient is doing well overall.  Her initial blood pressure was elevated but on the repeat it was 129/67.  She has no complaints today.  She unfortunately has a strong family history of heart disease and we are having problems getting her cholesterol numbers down.  She has had lab work done and we will will review this with her during the visit today.  Vitamin D level was 36.2.  Her blood sugar was good at 92 and the creatinine, was good at 0.94 with a slightly elevated sodium and a normal potassium and a slightly elevated chloride.  CBC was entirely within normal limits and stable.  Cholesterol numbers were more elevated with a total LDL particle number being 1634 and previously was 1455.  LDL C was elevated at 148.  Triglycerides were good this time at 140.  Good cholesterol was within normal limits.  All liver function tests were good.  She also had carotid Dopplers done in April 2017 which did reveal a 40-59% right internal carotid stenosis and a 1-39% left internal carotid stenosis.  The studies are going to be repeated in 1 year.  The patient denies any chest pain pressure tightness or shortness of breath.  She denies any trouble with her intestinal tract including heartburn indigestion nausea vomiting diarrhea blood in the stool or black tarry bowel movements.  She is passing her water well.  She is active.  She is not sure if she is taking any vitamin D3 or not and she will check and let us know what she is taking with the vitamin D3 that she has not taking any vascepa and we will call this in and if she can afford to take it she will take 2 twice a day and discontinue her current omega-3 fatty acid.  She is only taking Crestor 5 mg 1/2 pill on Monday Wednesday and  Friday currently and will try to increase this to 1/2 pill daily and will get back in touch with [place] if there is any problem with doing this.  Based on her cholesterol numbers are strong family history of heart disease she needs to get more statin on board if possible.     Patient Active Problem List   Diagnosis Date Noted  . Murmur 07/26/2016  . Carotid stenosis 07/22/2014  . Family history of heart disease 07/28/2013  . Need for prophylactic vaccination and inoculation against influenza 11/26/2012  . Diverticulosis   . Osteopenia 09/11/2012  . Hyperlipidemia LDL goal <70 07/23/2012  . H/O infectious disease 01/02/2012  . Pseudoptosis 11/07/2011   Outpatient Encounter Medications as of 05/02/2017  Medication Sig  . aspirin EC 81 MG tablet Take 81 mg by mouth daily.  . Calcium 500-125 MG-UNIT TABS 1 tablet. 1 tablet daily.  . Cholecalciferol (VITAMIN D3) 2000 units capsule 1 capsule.  . Multiple Vitamins tablet 1 tablet. 1 tablet daily.  . rosuvastatin (CRESTOR) 5 MG tablet Take 0.5 tablets (2.5 mg total) by mouth 3 (three) times a week.  . [DISCONTINUED] loratadine (CLARITIN) 10 MG tablet Take 1 tablet (10 mg total) by mouth daily.  . [DISCONTINUED] Icosapent Ethyl 1 g CAPS Take 2 g 2 (two) times daily by mouth.   No facility-administered encounter medications on file  as of 05/02/2017.      Review of Systems  Constitutional: Negative.   HENT: Negative.   Eyes: Negative.   Respiratory: Negative.   Cardiovascular: Negative.   Gastrointestinal: Negative.   Endocrine: Negative.   Genitourinary: Negative.   Musculoskeletal: Negative.   Skin: Negative.   Allergic/Immunologic: Negative.   Neurological: Negative.   Hematological: Negative.   Psychiatric/Behavioral: Negative.        Objective:   Physical Exam  Constitutional: She is oriented to person, place, and time. She appears well-developed and well-nourished. No distress.  The patient is pleasant and alert and in good  spirits  HENT:  Head: Normocephalic and atraumatic.  Mouth/Throat: No oropharyngeal exudate.  Nasal turbinate congestion bilaterally.  Cerumen partially obstructing both auditory canals and an irrigation will be performed to remove this as she wears hearing aids so that it does not interfere with her hearing.  Eyes: Conjunctivae and EOM are normal. Pupils are equal, round, and reactive to light. Right eye exhibits no discharge. Left eye exhibits no discharge. No scleral icterus.  Neck: Normal range of motion. Neck supple. No thyromegaly present.  No bruits thyromegaly or anterior cervical adenopathy  Cardiovascular: Normal rate, regular rhythm, normal heart sounds and intact distal pulses.  No murmur heard. Heart is regular at 60/min  Pulmonary/Chest: Effort normal and breath sounds normal. No respiratory distress. She has no wheezes. She has no rales.  Clear anteriorly and posteriorly  Abdominal: Soft. Bowel sounds are normal. She exhibits no mass. There is no tenderness. There is no rebound and no guarding.  No liver or spleen enlargement, no epigastric tenderness no masses and no inguinal adenopathy with good inguinal pulses.  Musculoskeletal: Normal range of motion. She exhibits no edema.  Lymphadenopathy:    She has no cervical adenopathy.  Neurological: She is alert and oriented to person, place, and time. She has normal reflexes. No cranial nerve deficit.  Skin: Skin is warm and dry. No rash noted.  Psychiatric: She has a normal mood and affect. Her behavior is normal. Judgment and thought content normal.  Nursing note and vitals reviewed.  BP (!) 143/63 (BP Location: Left Arm)   Pulse (!) 58   Temp 98.4 F (36.9 C) (Oral)   Ht 5\' 6"  (1.676 m)   Wt 173 lb (78.5 kg)   BMI 27.92 kg/m         Assessment & Plan:  1. Pure hypercholesterolemia -Cholesterol numbers are elevated with advanced lipid testing.  The patient is only taking half of a Crestor 5 on Monday Wednesday and  Friday.  She will increase this to one half daily and if she does not tolerate this increase she will get back in touch with [place].  2. Vitamin D deficiency -She is not sure how much vitamin D 3 she is currently taking.  She will call us back with that dosage and if necessary we will increase the dosage and get more vitamin D on board to try to get her range up to between 50 and 60.  3. Statin intolerance -She has been intolerant to higher doses of Crestor she is only taken 2-1/2 mg 3 times weekly currently.  She will increase this to 2-1/2 mg daily.  She will call us if she has trouble with taking this medication.  4. Carotid stenosis, bilateral -Continue to follow-up and get yearly Dopplers with a cardiologist  5. Family history of heart disease -Continue to follow-up with cardiology and pursue as aggressive therapeutic lifestyle changes  as possible  6. Excessive ear wax, bilateral -Ear irrigation to remove cerumen from each ear today.  Patient tolerated procedure well.  Meds ordered this encounter  Medications  . Icosapent Ethyl (VASCEPA) 1 g CAPS    Sig: Take 2 capsules (2 g total) by mouth 2 (two) times daily.    Dispense:  120 capsule    Refill:  3   Patient Instructions                       Medicare Annual Wellness Visit  [facility] and the medical providers at [facility] strive to bring you the best medical care.  In doing so we not only want to address your current medical conditions and concerns but also to detect new conditions early and prevent illness, disease and health-related problems.    Medicare offers a yearly Wellness Visit which allows our clinical staff to assess your need for preventative services including immunizations, lifestyle education, counseling to decrease risk of preventable diseases and screening for fall risk and other medical concerns.    This visit is provided free of charge (no copay) for all Medicare recipients. The clinical  pharmacists at [facility] have begun to conduct these Wellness Visits which will also include a thorough review of all your medications.    As you primary medical provider recommend that you make an appointment for your Annual Wellness Visit if you have not done so already this year.  You may set up this appointment before you leave today or you may call back ([phone]) and schedule an appointment.  Please make sure when you call that you mention that you are scheduling your Annual Wellness Visit with the clinical pharmacist so that the appointment may be made for the proper length of time.      Continue current medications. Continue good therapeutic lifestyle changes which include good diet and exercise. Fall precautions discussed with patient. If an FOBT was given today- please return it to our front desk. If you are over 61 years old - you may need Prevnar 87 or the adult Pneumonia vaccine.  **Flu shots are available--- please call and schedule a FLU-CLINIC appointment**  After your visit with [place] today you will receive a survey in the mail or online from [employer] regarding your care with [place]. Please take a moment to fill this out. Your feedback is very important to [place] as you can help [place] better understand your patient needs as well as improve your experience and satisfaction. WE CARE ABOUT YOU!!!   Continue to follow-up with cardiology and get carotid Dopplers repeated as planned Check the dose of vitamin D3 that you are currently taking at home and give [place] a call with that amount as we may like to increase this Check with the pharmacy regarding the vascepa and if it is affordable change to this and get off of the fish oil that you are currently taking. You can purchase Debrox over-the-counter and use 3-4 drops nightly for 3 nights to each ear canal to help keep the earwax softened up so that it does not obstruct the ear canal and helps to enhance your hearing Increase  the Crestor and if tolerated stay on 1/2 pill daily instead of 3 times weekly Continue with aggressive therapeutic lifestyle changes  Arrie Senate MD

## 2017-05-02 NOTE — Patient Instructions (Addendum)
Medicare Annual Wellness Visit  [facility] and the medical providers at [facility] strive to bring you the best medical care.  In doing so we not only want to address your current medical conditions and concerns but also to detect new conditions early and prevent illness, disease and health-related problems.    Medicare offers a yearly Wellness Visit which allows our clinical staff to assess your need for preventative services including immunizations, lifestyle education, counseling to decrease risk of preventable diseases and screening for fall risk and other medical concerns.    This visit is provided free of charge (no copay) for all Medicare recipients. The clinical pharmacists at [facility] have begun to conduct these Wellness Visits which will also include a thorough review of all your medications.    As you primary medical provider recommend that you make an appointment for your Annual Wellness Visit if you have not done so already this year.  You may set up this appointment before you leave today or you may call back ([phone]) and schedule an appointment.  Please make sure when you call that you mention that you are scheduling your Annual Wellness Visit with the clinical pharmacist so that the appointment may be made for the proper length of time.      Continue current medications. Continue good therapeutic lifestyle changes which include good diet and exercise. Fall precautions discussed with patient. If an FOBT was given today- please return it to our front desk. If you are over 81 years old - you may need Prevnar 6 or the adult Pneumonia vaccine.  **Flu shots are available--- please call and schedule a FLU-CLINIC appointment**  After your visit with [place] today you will receive a survey in the mail or online from [employer] regarding your care with [place]. Please take a moment to fill this out. Your feedback is very  important to [place] as you can help [place] better understand your patient needs as well as improve your experience and satisfaction. WE CARE ABOUT YOU!!!   Continue to follow-up with cardiology and get carotid Dopplers repeated as planned Check the dose of vitamin D3 that you are currently taking at home and give [place] a call with that amount as we may like to increase this Check with the pharmacy regarding the vascepa and if it is affordable change to this and get off of the fish oil that you are currently taking. You can purchase Debrox over-the-counter and use 3-4 drops nightly for 3 nights to each ear canal to help keep the earwax softened up so that it does not obstruct the ear canal and helps to enhance your hearing Increase the Crestor and if tolerated stay on 1/2 pill daily instead of 3 times weekly Continue with aggressive therapeutic lifestyle changes

## 2017-05-08 ENCOUNTER — Other Ambulatory Visit: Payer: Self-pay | Admitting: *Deleted

## 2017-05-08 MED ORDER — ROSUVASTATIN CALCIUM 5 MG PO TABS: 2.5000 mg | ORAL_TABLET | Freq: Every day | ORAL | 3 refills | Status: DC

## 2017-05-29 ENCOUNTER — Ambulatory Visit (INDEPENDENT_AMBULATORY_CARE_PROVIDER_SITE_OTHER): Payer: Medicare HMO | Admitting: Family

## 2017-05-29 ENCOUNTER — Encounter: Payer: Self-pay | Admitting: Family

## 2017-05-29 VITALS — BP 123/66 | HR 60 | Temp 97.0°F | Ht 66.0 in | Wt 176.2 lb

## 2017-05-29 DIAGNOSIS — Z01419 Encounter for gynecological examination (general) (routine) without abnormal findings: Secondary | ICD-10-CM | POA: Diagnosis not present

## 2017-05-29 NOTE — Patient Instructions (Signed)

## 2017-05-29 NOTE — Progress Notes (Signed)
   Subjective:    Patient ID: Carol [name], female    DOB: Jan 29, 1946, 72 y.o.   MRN: [id number]  Pt presents to the office today for pelvic/pap exam. She is followed by Dr. Laurance Flatten every 6 months who manages her chronic conditions. States this is stable and denies any complaints today  Gynecologic Exam  The patient's pertinent negatives include no genital itching, genital lesions, genital odor, pelvic pain or vaginal discharge. The patient is experiencing no pain. Pertinent negatives include no back pain, constipation, diarrhea, frequency, headaches, hematuria, painful intercourse, sore throat or urgency. She is not sexually active.        Review of Systems  HENT: Negative for sore throat.   Gastrointestinal: Negative for constipation and diarrhea.  Genitourinary: Negative for frequency, hematuria, pelvic pain, urgency and vaginal discharge.  Musculoskeletal: Negative for back pain.  Neurological: Negative for headaches.  All other systems reviewed and are negative.      Objective:   Physical Exam  Constitutional: She is oriented to person, place, and time. She appears well-developed and well-nourished. No distress.  HENT:  Head: Normocephalic and atraumatic.  Right Ear: External ear normal.  Mouth/Throat: Oropharynx is clear and moist.  Eyes: Pupils are equal, round, and reactive to light.  Neck: Normal range of motion. Neck supple. No thyromegaly present.  Cardiovascular: Normal rate, regular rhythm, normal heart sounds and intact distal pulses.  No murmur heard. Pulmonary/Chest: Effort normal and breath sounds normal. No respiratory distress. She has no wheezes. Right breast exhibits no inverted nipple, no mass, no nipple discharge, no skin change and no tenderness. Left breast exhibits no inverted nipple, no mass, no nipple discharge, no skin change and no tenderness. Breasts are symmetrical.  Abdominal: Soft. Bowel sounds are normal. She exhibits no distension. There is no  tenderness.  Genitourinary: Vagina normal.  Genitourinary Comments: Bimanual exam- no adnexal masses or tenderness, ovaries nonpalpable   Cervix parous and pink- No discharge   Musculoskeletal: Normal range of motion. She exhibits no edema or tenderness.  Neurological: She is alert and oriented to person, place, and time.  Skin: Skin is warm and dry.  Psychiatric: She has a normal mood and affect. Her behavior is normal. Judgment and thought content normal.  Vitals reviewed.     BP 123/66   Pulse 60   Temp (!) 97 F (36.1 C) (Oral)   Ht 5\' 6"  (1.676 m)   Wt 176 lb 3.2 oz (79.9 kg)   BMI 28.44 kg/m      Assessment & Plan:  1. Gynecologic exam normal - Pap IG (Image Guided)   Continue all meds Health Maintenance reviewed Diet and exercise encouraged RTO as needed and keep chronic follow up with PCP  Evelina Dun, FNP

## 2017-05-30 LAB — PAP IG (IMAGE GUIDED): PAP Smear Comment: 0

## 2017-06-07 ENCOUNTER — Other Ambulatory Visit: Payer: Self-pay | Admitting: Internal Medicine

## 2017-06-07 DIAGNOSIS — I6523 Occlusion and stenosis of bilateral carotid arteries: Secondary | ICD-10-CM

## 2017-06-12 DIAGNOSIS — M47816 Spondylosis without myelopathy or radiculopathy, lumbar region: Secondary | ICD-10-CM | POA: Diagnosis not present

## 2017-06-12 DIAGNOSIS — M9903 Segmental and somatic dysfunction of lumbar region: Secondary | ICD-10-CM | POA: Diagnosis not present

## 2017-06-12 DIAGNOSIS — S335XXA Sprain of ligaments of lumbar spine, initial encounter: Secondary | ICD-10-CM | POA: Diagnosis not present

## 2017-06-13 DIAGNOSIS — M9903 Segmental and somatic dysfunction of lumbar region: Secondary | ICD-10-CM | POA: Diagnosis not present

## 2017-06-13 DIAGNOSIS — S335XXA Sprain of ligaments of lumbar spine, initial encounter: Secondary | ICD-10-CM | POA: Diagnosis not present

## 2017-06-13 DIAGNOSIS — M47816 Spondylosis without myelopathy or radiculopathy, lumbar region: Secondary | ICD-10-CM | POA: Diagnosis not present

## 2017-06-14 DIAGNOSIS — M47816 Spondylosis without myelopathy or radiculopathy, lumbar region: Secondary | ICD-10-CM | POA: Diagnosis not present

## 2017-06-14 DIAGNOSIS — S335XXA Sprain of ligaments of lumbar spine, initial encounter: Secondary | ICD-10-CM | POA: Diagnosis not present

## 2017-06-14 DIAGNOSIS — M9903 Segmental and somatic dysfunction of lumbar region: Secondary | ICD-10-CM | POA: Diagnosis not present

## 2017-06-18 DIAGNOSIS — M47816 Spondylosis without myelopathy or radiculopathy, lumbar region: Secondary | ICD-10-CM | POA: Diagnosis not present

## 2017-06-18 DIAGNOSIS — M9903 Segmental and somatic dysfunction of lumbar region: Secondary | ICD-10-CM | POA: Diagnosis not present

## 2017-06-18 DIAGNOSIS — S335XXA Sprain of ligaments of lumbar spine, initial encounter: Secondary | ICD-10-CM | POA: Diagnosis not present

## 2017-06-27 ENCOUNTER — Ambulatory Visit: Payer: 59 | Admitting: Internal Medicine

## 2017-07-13 ENCOUNTER — Other Ambulatory Visit: Payer: Self-pay | Admitting: Physician Assistant

## 2017-07-13 DIAGNOSIS — L570 Actinic keratosis: Secondary | ICD-10-CM | POA: Diagnosis not present

## 2017-07-13 DIAGNOSIS — D0439 Carcinoma in situ of skin of other parts of face: Secondary | ICD-10-CM | POA: Diagnosis not present

## 2017-07-27 ENCOUNTER — Encounter: Payer: Self-pay | Admitting: Internal Medicine

## 2017-07-27 ENCOUNTER — Ambulatory Visit (INDEPENDENT_AMBULATORY_CARE_PROVIDER_SITE_OTHER): Payer: 59 | Admitting: Internal Medicine

## 2017-07-27 ENCOUNTER — Ambulatory Visit (HOSPITAL_COMMUNITY)
Admission: RE | Admit: 2017-07-27 | Discharge: 2017-07-27 | Disposition: A | Discharge status: Home / Self Care | Payer: Medicare HMO | Source: Ambulatory Visit | Attending: Internal Medicine | Admitting: Internal Medicine

## 2017-07-27 VITALS — BP 130/72 | HR 49 | Ht 66.0 in | Wt 177.8 lb

## 2017-07-27 DIAGNOSIS — E785 Hyperlipidemia, unspecified: Secondary | ICD-10-CM

## 2017-07-27 DIAGNOSIS — Z8249 Family history of ischemic heart disease and other diseases of the circulatory system: Secondary | ICD-10-CM

## 2017-07-27 DIAGNOSIS — I6523 Occlusion and stenosis of bilateral carotid arteries: Secondary | ICD-10-CM | POA: Insufficient documentation

## 2017-07-27 MED ORDER — EZETIMIBE 10 MG PO TABS: 10.0000 mg | ORAL_TABLET | Freq: Every day | ORAL | 3 refills | Status: DC

## 2017-07-27 NOTE — Patient Instructions (Addendum)
Your physician has recommended you make the following change in your medication: START zetia 10mg  daily  Dr. Debara Pickett recommends Repatha Pushtronex (PCSK9). This is an injectable cholesterol medication given once every 28 days. This medication will need prior approval with your insurance company, which we will work on. If the medication is not approved initially, we may need to do an appeal with your insurance. We will keep you updated on this process. This medication can be provided at some local pharmacies or be shipped to your from a specialty pharmacy.   Your physician recommends that you return for lab work FASTING in 3 months to check cholesterol  Your physician recommends that you schedule an appointment in 3 months for lipid clinic follow up with Dr. Debara Pickett

## 2017-07-27 NOTE — Progress Notes (Signed)
OFFICE NOTE  Chief Complaint:  Routine follow-up  Primary Care Physician: Chipper Herb, MD  HPI:  Carol [name] is a pleasant 72 year old female kindly referred to me by Dr. Laurance Flatten for cardiac evaluation. Recently she underwent lifeline screening was found to have carotid stenosis. She had a formal carotid Doppler performed at [facility] which demonstrated mild to moderate bilateral carotid artery disease. She's been adequately managed in fact is on no Vytorin and 5 fenofibrate for elevated cholesterol. Recent labs indicated an LDL-P of 1143, LDL 95, HDL 68 and triglycerides 68. Generally this is good control however given the fact that she has significant carotid artery disease, her targets for cholesterol management are much lower. We should strive for a LDL cholesterol less than 70. She's a symptomatically regards to chest pain or shortness of breath. Her brother recently died with heart attack at age 3 and of course she is appropriately concerned about that. She also has history of murmur which is been followed.  07/27/2015  Carol [name] returns today for follow-up. She seems to be doing quite well. She is physically active and walks about 3 miles several times a week. She recently had repeat carotid Dopplers which show moderate right internal carotid artery stenosis and mild left internal carotid artery stenosis which is stable compared to her studies a year ago. She also had repeat lipid testing which shows very good control at this point although triglycerides were somewhat elevated. She was started on fenofibrate in addition to her Crestor, and seems to be tolerating this well over the past month. She denies any myalgias. She is on daily aspirin. She denies any chest pain or worsening shortness of breath.  07/26/2016  Carol [name] returns for follow-up. She underwent carotid Dopplers this morning which preliminarily looks stable with a 40-59% R ICA stenosis and a 0-94% LICA stenosis.  She is asymptomatic denies any chest pain or worsening shortness of breath. There is a strong family history of carotid artery disease as well as a coronary artery disease in the family. Her PCP has been adjusting her medications as she's had side effects with the statins in the past. She was intolerant to Crestor however this was in addition of fenofibrate. She was also previously on Vytorin 10/20 mg in addition to fenofibrate 148 mg and had side effects. She also states that she felt she had side effects off of fenofibrate. Is not clear if it was just a combination of medications or the statin alone. Currently she is on pitavastatin, but she reports her medications very expensive. Her recent lipid profile 2 weeks ago showed total cholesterol 188, triglycerides 241, HDL-C 47 and LDL-C 93. Goal LDL-C less than 70. Her PCP recommended ezetimibe however she was hesitant to take it as she said it did not work in the past. She did not realize though that Vytorin contained ezetimibe.  07/27/2017  Carol [name] was seen today in follow-up.  She underwent carotid Dopplers today and the official read is not yet performed.  This demonstrated 40 to 59% stenosis in the right and less than 39% stenosis on the left.  This is consistent with her prior values and it is recommended she have follow-up Dopplers in a year.  In the past however we have had difficulty managing her cholesterol.  She is currently only taking 2.5 mg of rosuvastatin daily.  This is about the most that she can manage.  She had significant intolerance to both atorvastatin and rosuvastatin at  doses of 20 and 40 mg.  Labs in March 2019 showed total LDL-PA 1634, LDL-C of 148, indicating significant residual risk.  Her goal LDL should be less than 70.  We previously discussed other management options including ezetimibe and possible PCSK9 inhibitor therapy.  PMHx:  Past Medical History:  Diagnosis Date  . Carotid artery stenosis   . Cataract   .  Diverticulosis   . Fractured hand    right  . Hyperlipidemia   . Osteopenia   . Precancerous skin lesion 2016    Past Surgical History:  Procedure Laterality Date  . CATARACT EXTRACTION W/ INTRAOCULAR LENS IMPLANT Bilateral   . CHOLECYSTECTOMY  2009  . EYE SURGERY      FAMHx:  Family History  Problem Relation Age of Onset  . Heart disease Mother   . Heart disease Father   . Cancer Sister        breast  . Hyperlipidemia Sister   . Diabetes Sister   . Cancer Sister        bone  . Alzheimer's disease Sister   . Heart disease Brother   . CAD Brother   . Cancer Sister        breast  . Kidney disease Sister   . Heart disease Brother   . Hypertension Brother   . Alzheimer's disease Brother   . Kidney disease Brother        2018 starting dialysis    SOCHx:   reports that she has never smoked. She has never used smokeless tobacco. She reports that she does not drink alcohol or use drugs.  ALLERGIES:  Allergies  Allergen Reactions  . Penicillins Hives  . Sulfa Antibiotics   . Sulfamethoxazole Itching  . Lipitor [Atorvastatin] Diarrhea  . Crestor [Rosuvastatin Calcium] Other (See Comments)    Myalgias     ROS: Pertinent items noted in HPI and remainder of comprehensive ROS otherwise negative.  HOME MEDS: Current Outpatient Medications  Medication Sig Dispense Refill  . aspirin EC 81 MG tablet Take 81 mg by mouth daily.    . Calcium 500-125 MG-UNIT TABS 1 tablet. 1 tablet daily.    . Cholecalciferol (VITAMIN D3) 2000 units capsule 1 capsule.    . Multiple Vitamins tablet 1 tablet. 1 tablet daily.    . Omega-3 Fatty Acids ([employer] FISH OIL) 1200 MG CAPS Take 4 capsules by mouth every morning.    . rosuvastatin (CRESTOR) 5 MG tablet Take 2.5 mg by mouth daily.     No current facility-administered medications for this visit.     LABS/IMAGING: No results found for this or any previous visit (from the past 48 hour(s)). No results found.  WEIGHTS: Wt Readings from  Last 3 Encounters:  07/27/17 177 lb 12.8 oz (80.6 kg)  05/29/17 176 lb 3.2 oz (79.9 kg)  05/02/17 173 lb (78.5 kg)    VITALS: BP 130/72   Pulse (!) 49   Ht 5\' 6"  (1.676 m)   Wt 177 lb 12.8 oz (80.6 kg)   BMI 28.70 kg/m   EXAM: General appearance: alert and no distress Neck: no carotid bruit and no JVD Lungs: clear to auscultation bilaterally Heart: regular rate and rhythm, S1, S2 normal and systolic murmur: early systolic 2/6, blowing at apex Abdomen: soft, non-tender; bowel sounds normal; no masses,  no organomegaly Extremities: extremities normal, atraumatic, no cyanosis or edema Pulses: 2+ and symmetric Skin: Skin color, texture, turgor normal. No rashes or lesions Neurologic: Grossly normal Psych: Pleasant  EKG: Sinus bradycardia 49-personally reviewed  ASSESSMENT: 1. Bilateral carotid artery stenosis 2. Dyslipidemia 3. Family history of premature coronary artery disease  PLAN: 1.   Carol [name] has stable bilateral carotid artery stenosis but marked dyslipidemia and well above goal LDL cholesterol less than 70.  She can only tolerate very low-dose rosuvastatin 2.5 mg daily.  I think she would benefit from the addition of ezetimibe 10 mg daily.  In addition she will likely remain above target LDL and require additional therapy such as a PCSK9 inhibitor.  I think she is a good candidate for twice monthly Repatha.  We will go ahead and try to do approved prior authorization for that and demonstrate the injection today as she is agreeable.  Follow-up with me in 3 to 4 months.  Pixie Casino, MD, [facility], [place] Director of the Advanced Lipid Disorders &  Cardiovascular Risk Reduction Clinic Diplomate of the American Board of Clinical Lipidology Attending Cardiologist  Direct Dial: [phone]  Fax: [phone]  Website:  [email or web address] 07/27/2017, 9:25 AM

## 2017-07-30 ENCOUNTER — Other Ambulatory Visit: Payer: Self-pay | Admitting: *Deleted

## 2017-07-30 DIAGNOSIS — I6523 Occlusion and stenosis of bilateral carotid arteries: Secondary | ICD-10-CM

## 2017-07-31 ENCOUNTER — Telehealth: Payer: Self-pay | Admitting: Internal Medicine

## 2017-07-31 NOTE — Telephone Encounter (Signed)
PA for repatha submitted via [email or web address] [employer] PA Case ID: [id number]

## 2017-08-01 NOTE — Telephone Encounter (Signed)
Repatha [employer] has been approved from 02/25/2017 - 02/26/2018 Referral #: [id number]

## 2017-08-01 NOTE — Telephone Encounter (Signed)
Spoke with patient. She was notified by her insurance yesterday that her Repatha was approved. She would like to start this in July as she has vacation this month and would like to come by the office for her first injection.   She reports her cost will be $100/month for Repatha  Her zetia will cost her $188/90 day supply. She picked this up yesterday.   Will check with her insurance company on which pharmacy Rx will need to be sent to.

## 2017-08-03 MED ORDER — EVOLOCUMAB 140 MG/ML ~~LOC~~ SOAJ: 1.0000 | SUBCUTANEOUS | 3 refills | Status: DC

## 2017-08-03 NOTE — Addendum Note (Signed)
Addended by: Fidel Levy on: 08/03/2017 01:39 PM   Modules accepted: Orders

## 2017-08-03 NOTE — Telephone Encounter (Signed)
Per patient's insurance, she will need to use [employer]. Rx(s) sent to pharmacy electronically. Patient notified via MyChart message.

## 2017-08-07 ENCOUNTER — Telehealth: Payer: Self-pay | Admitting: Internal Medicine

## 2017-08-07 NOTE — Telephone Encounter (Signed)
New message    Patient calling with questions about Evolocumab (REPATHA SURECLICK) 224 MG/ML SOAJ

## 2017-08-07 NOTE — Telephone Encounter (Signed)
Spoke with patient, she was concerned because Repatha cost went from $100 to $300 when the pharmacy called her.  She later realized this was for 3 months supply.  Would prefer to get one month at a time.  Also wanted to know how to schedule time to come in office for first injection.  Advised patient that she can call pharmacy ([facility]) and tell them to only ship one month of medication, for cost reasons.  Once she has the medication in hand she can call and arrange a time with Eliezer Lofts or PharmD for injection training.  Patient agreeable to this.  Will probably wait a few weeks as is going to [place] and would prefer to start after she gets home.

## 2017-08-23 NOTE — Telephone Encounter (Signed)
Patient had first dose of Repatha in office today 08/23/17

## 2017-08-24 NOTE — Telephone Encounter (Signed)
Patient had first Repathta injection 08/23/17 and has MD follow up on 11/13/17

## 2017-09-10 DIAGNOSIS — H04123 Dry eye syndrome of bilateral lacrimal glands: Secondary | ICD-10-CM | POA: Diagnosis not present

## 2017-09-10 DIAGNOSIS — H26491 Other secondary cataract, right eye: Secondary | ICD-10-CM | POA: Diagnosis not present

## 2017-10-10 DIAGNOSIS — R69 Illness, unspecified: Secondary | ICD-10-CM | POA: Diagnosis not present

## 2017-11-02 ENCOUNTER — Other Ambulatory Visit: Payer: Medicare HMO

## 2017-11-02 DIAGNOSIS — I6523 Occlusion and stenosis of bilateral carotid arteries: Secondary | ICD-10-CM

## 2017-11-02 DIAGNOSIS — E559 Vitamin D deficiency, unspecified: Secondary | ICD-10-CM | POA: Diagnosis not present

## 2017-11-02 DIAGNOSIS — Z8249 Family history of ischemic heart disease and other diseases of the circulatory system: Secondary | ICD-10-CM | POA: Diagnosis not present

## 2017-11-02 DIAGNOSIS — E7800 Pure hypercholesterolemia, unspecified: Secondary | ICD-10-CM

## 2017-11-03 LAB — CBC WITH DIFFERENTIAL/PLATELET
Basophils Absolute: 0 10*3/uL (ref 0.0–0.2)
Basos: 1 %
EOS (ABSOLUTE): 0.2 10*3/uL (ref 0.0–0.4)
Eos: 3 %
HEMOGLOBIN: 13.8 g/dL (ref 11.1–15.9)
Hematocrit: 42.5 % (ref 34.0–46.6)
Immature Grans (Abs): 0 10*3/uL (ref 0.0–0.1)
Immature Granulocytes: 0 %
LYMPHS ABS: 2.5 10*3/uL (ref 0.7–3.1)
Lymphs: 42 %
MCH: 29.7 pg (ref 26.6–33.0)
MCHC: 32.5 g/dL (ref 31.5–35.7)
MCV: 91 fL (ref 79–97)
MONOCYTES: 9 %
Monocytes Absolute: 0.5 10*3/uL (ref 0.1–0.9)
Neutrophils Absolute: 2.8 10*3/uL (ref 1.4–7.0)
Neutrophils: 45 %
Platelets: 204 10*3/uL (ref 150–450)
RBC: 4.65 x10E6/uL (ref 3.77–5.28)
RDW: 13 % (ref 12.3–15.4)
WBC: 6.1 10*3/uL (ref 3.4–10.8)

## 2017-11-03 LAB — LIPID PANEL
Chol/HDL Ratio: 2.4 ratio (ref 0.0–4.4)
Cholesterol, Total: 124 mg/dL (ref 100–199)
HDL: 52 mg/dL (ref >39)
LDL Calculated: 49 mg/dL (ref 0–99)
TRIGLYCERIDES: 114 mg/dL (ref 0–149)
VLDL Cholesterol Cal: 23 mg/dL (ref 5–40)

## 2017-11-03 LAB — BMP8+EGFR
BUN / CREAT RATIO: 17 (ref 12–28)
BUN: 17 mg/dL (ref 8–27)
CO2: 25 mmol/L (ref 20–29)
Calcium: 9.4 mg/dL (ref 8.7–10.3)
Chloride: 102 mmol/L (ref 96–106)
Creatinine, Ser: 1.01 mg/dL — ABNORMAL HIGH (ref 0.57–1.00)
GFR calc non Af Amer: 55 mL/min/{1.73_m2} — ABNORMAL LOW (ref >59)
GFR, EST AFRICAN AMERICAN: 63 mL/min/{1.73_m2} (ref >59)
Glucose: 89 mg/dL (ref 65–99)
Potassium: 4.3 mmol/L (ref 3.5–5.2)
Sodium: 142 mmol/L (ref 134–144)

## 2017-11-03 LAB — HEPATIC FUNCTION PANEL
ALBUMIN: 4.6 g/dL (ref 3.5–4.8)
ALT: 15 IU/L (ref 0–32)
AST: 18 IU/L (ref 0–40)
Alkaline Phosphatase: 62 IU/L (ref 39–117)
Bilirubin Total: 0.8 mg/dL (ref 0.0–1.2)
Bilirubin, Direct: 0.19 mg/dL (ref 0.00–0.40)
Total Protein: 7.2 g/dL (ref 6.0–8.5)

## 2017-11-03 LAB — VITAMIN D 25 HYDROXY (VIT D DEFICIENCY, FRACTURES): Vit D, 25-Hydroxy: 50.7 ng/mL (ref 30.0–100.0)

## 2017-11-06 ENCOUNTER — Ambulatory Visit (INDEPENDENT_AMBULATORY_CARE_PROVIDER_SITE_OTHER): Payer: Medicare HMO | Admitting: Family Medicine

## 2017-11-06 ENCOUNTER — Encounter: Payer: Self-pay | Admitting: Family Medicine

## 2017-11-06 VITALS — BP 136/63 | HR 53 | Temp 97.5°F | Ht 66.0 in | Wt 177.0 lb

## 2017-11-06 DIAGNOSIS — Z8249 Family history of ischemic heart disease and other diseases of the circulatory system: Secondary | ICD-10-CM | POA: Diagnosis not present

## 2017-11-06 DIAGNOSIS — E7800 Pure hypercholesterolemia, unspecified: Secondary | ICD-10-CM | POA: Diagnosis not present

## 2017-11-06 DIAGNOSIS — E559 Vitamin D deficiency, unspecified: Secondary | ICD-10-CM | POA: Diagnosis not present

## 2017-11-06 NOTE — Patient Instructions (Addendum)
Medicare Annual Wellness Visit  [facility] and the medical providers at [facility] strive to bring you the best medical care.  In doing so we not only want to address your current medical conditions and concerns but also to detect new conditions early and prevent illness, disease and health-related problems.    Medicare offers a yearly Wellness Visit which allows our clinical staff to assess your need for preventative services including immunizations, lifestyle education, counseling to decrease risk of preventable diseases and screening for fall risk and other medical concerns.    This visit is provided free of charge (no copay) for all Medicare recipients. The clinical pharmacists at [facility] have begun to conduct these Wellness Visits which will also include a thorough review of all your medications.    As you primary medical provider recommend that you make an appointment for your Annual Wellness Visit if you have not done so already this year.  You may set up this appointment before you leave today or you may call back ([phone]) and schedule an appointment.  Please make sure when you call that you mention that you are scheduling your Annual Wellness Visit with the clinical pharmacist so that the appointment may be made for the proper length of time.     Continue current medications. Continue good therapeutic lifestyle changes which include good diet and exercise. Fall precautions discussed with patient. If an FOBT was given today- please return it to our front desk. If you are over 40 years old - you may need Prevnar 36 or the adult Pneumonia vaccine.  **Flu shots are available--- please call and schedule a FLU-CLINIC appointment**  After your visit with [place] today you will receive a survey in the mail or online from [employer] regarding your care with [place]. Please take a moment to fill this out. Your feedback is very  important to [place] as you can help [place] better understand your patient needs as well as improve your experience and satisfaction. WE CARE ABOUT YOU!!!   Continue with current treatment regimen Continue to drink plenty of water and fluids Continue with Repatha Recheck BMP and lipid liver panel in 3 months Stay active physically Remember that next colonoscopy would be due in 2023 if the gastroenterologist decides to do this. You can purchase Debrox eardrops over-the-counter to help soften earwax.  The way these are used to apply 3 to 4 drops nightly for 3 nights in a row wait a week and reapply and this will help soften the earwax so that it comes out more easily.  Follow-up with Dr. Debara Pickett, your cardiologist as he scheduled with you.

## 2017-11-06 NOTE — Progress Notes (Signed)
Subjective:    Patient ID: Carol [name], female    DOB: [birth year], 72 y.o.   MRN: [id number]  HPI Pt here for follow up and management of chronic medical problems which includes hyperlipidemia. She is taking medication regularly.  Patient is pleasant and has a strong family history of heart disease with elevated cholesterol numbers and intolerance to statin drugs.  She is recently been started on Repatha and has had an excellent result with an LDL C going from 192 down to 89.  We are very proud of her for trying this and being able to take this medicine and using it regularly.  She has had lab work done and her recent cholesterol numbers with traditional lipid testing had an LDL C of 49.  The good cholesterol is improved at 52 and triglycerides are better at 114.  The blood sugar was good at 89 and the creatinine, the most important kidney function test was minimally elevated we will continue to monitor this.  All of the electrolytes including potassium are good.  The CBC was within normal limits with a normal white count good hemoglobin and adequate platelet count.  Vitamin D level was excellent at 50.7 all liver function tests were normal.  He has no complaints today.  She is due to return in FOBT.  She will continue to be followed by her cardiologist, Dr. Debara Pickett.  Her last colonoscopy was in 2013.  He was told by the gastroenterologist that she would not need another colonoscopy for 10 years.  The patient has been walking 3-5 times weekly.  She is doing her Repatha regularly.  She denies any chest pain pressure tightness or palpitations.  She denies any trouble with shortness of breath.  She denies any trouble with swallowing heartburn indigestion nausea vomiting diarrhea blood in the stool black tarry bowel movements or change in bowel habits.  She is passing her water without problems and trying to drink plenty of water daily.  The patient just informed me that her son who is 32 years old had his  first heart attack in July of this year.  So this family history of heart disease is truly very strong in this patient.  She has one other daughter and I encouraged her to get her daughter to see the cardiologist to get a work-up for any heart disease in her.   Patient Active Problem List   Diagnosis Date Noted  . Bilateral carotid artery stenosis 07/27/2017  . Murmur 07/26/2016  . Carotid stenosis 07/22/2014  . Family history of heart disease 07/28/2013  . Need for prophylactic vaccination and inoculation against influenza 11/26/2012  . Diverticulosis   . Osteopenia 09/11/2012  . Hyperlipidemia LDL goal <70 07/23/2012  . H/O infectious disease 01/02/2012  . Pseudoptosis 11/07/2011   Outpatient Encounter Medications as of 11/06/2017  Medication Sig  . aspirin EC 81 MG tablet Take 81 mg by mouth daily.  . Calcium 500-125 MG-UNIT TABS 1 tablet. 1 tablet daily.  . Cholecalciferol (VITAMIN D3) 2000 units capsule 1 capsule.  . Evolocumab (REPATHA SURECLICK) 174 MG/ML SOAJ Inject 1 Dose into the skin every 14 (fourteen) days.  . Multiple Vitamins tablet 1 tablet. 1 tablet daily.  . Omega-3 Fatty Acids ([employer]S FISH OIL) 1200 MG CAPS Take 4 capsules by mouth every morning.  . rosuvastatin (CRESTOR) 5 MG tablet Take 2.5 mg by mouth daily.  . [DISCONTINUED] ezetimibe (ZETIA) 10 MG tablet Take 1 tablet (10 mg total) by mouth daily.  No facility-administered encounter medications on file as of 11/06/2017.       Review of Systems  Constitutional: Negative.   HENT: Negative.   Eyes: Negative.   Respiratory: Negative.   Cardiovascular: Negative.   Gastrointestinal: Negative.   Endocrine: Negative.   Genitourinary: Negative.   Musculoskeletal: Negative.   Skin: Negative.   Allergic/Immunologic: Negative.   Neurological: Negative.   Hematological: Negative.   Psychiatric/Behavioral: Negative.        Objective:   Physical Exam  Constitutional: She is oriented to person, place, and time.  She appears well-developed and well-nourished. No distress.  The patient is pleasant and relaxed and smiling and feeling well with no specific complaints.  HENT:  Head: Normocephalic and atraumatic.  Right Ear: External ear normal.  Left Ear: External ear normal.  Nose: Nose normal.  Mouth/Throat: Oropharynx is clear and moist. No oropharyngeal exudate.  Slight nasal turbinate congestion bilaterally and minimal earwax bilaterally and she wears hearing aids.  Eyes: Pupils are equal, round, and reactive to light. Conjunctivae and EOM are normal. Right eye exhibits no discharge. Left eye exhibits no discharge. No scleral icterus.  Just saw the ophthalmologist and everything was stable and is followed yearly by the ophthalmologist.  Neck: Normal range of motion. Neck supple. No thyromegaly present.  No bruits thyromegaly or anterior cervical adenopathy  Cardiovascular: Normal rate, normal heart sounds and intact distal pulses.  No murmur heard. The heart is slightly irregular at 60/min with no murmurs  Pulmonary/Chest: Effort normal and breath sounds normal. She has no wheezes. She has no rales.  Clear anteriorly and posteriorly  Abdominal: Soft. Bowel sounds are normal. She exhibits no mass. There is no tenderness.  No liver or spleen enlargement no epigastric or suprapubic tenderness no masses.  Musculoskeletal: Normal range of motion. She exhibits no edema.  Lymphadenopathy:    She has no cervical adenopathy.  Neurological: She is alert and oriented to person, place, and time. She has normal reflexes. No cranial nerve deficit.  Reflexes are 2+ and equal bilaterally in lower extremities.  Skin: Skin is warm and dry. No rash noted.  No rash, no specific worrisome lesions on the back.  Psychiatric: She has a normal mood and affect. Her behavior is normal. Judgment and thought content normal.  The patient's mood affect and behavior were all normal.  Nursing note and vitals reviewed.  BP  136/63 (BP Location: Left Arm)   Pulse (!) 53   Temp (!) 97.5 F (36.4 C) (Oral)   Ht 5\' 6"  (1.676 m)   Wt 177 lb (80.3 kg)   BMI 28.57 kg/m         Assessment & Plan:  1. Pure hypercholesterolemia -Patient has had an excellent response to Repatha plus taking low-dose Crestor.  She will continue with her therapeutic lifestyle changes and continue with the Repatha and we will ask her to repeat the lipid liver panel in about 3 months along with a BMP.  2. Vitamin D deficiency -Continue with current vitamin D replacement  3. Family history of heart disease -The patient's son who is 58 years old recently had a heart attack the summer in July.  This is a strong family history trait.  She has one other child who is a daughter and I encouraged her to get the daughter to see the cardiologist.  Patient Instructions                       Medicare  Annual Wellness Visit  [facility] and the medical providers at [facility] strive to bring you the best medical care.  In doing so we not only want to address your current medical conditions and concerns but also to detect new conditions early and prevent illness, disease and health-related problems.    Medicare offers a yearly Wellness Visit which allows our clinical staff to assess your need for preventative services including immunizations, lifestyle education, counseling to decrease risk of preventable diseases and screening for fall risk and other medical concerns.    This visit is provided free of charge (no copay) for all Medicare recipients. The clinical pharmacists at [facility] have begun to conduct these Wellness Visits which will also include a thorough review of all your medications.    As you primary medical provider recommend that you make an appointment for your Annual Wellness Visit if you have not done so already this year.  You may set up this appointment before you leave today or you  may call back ([phone]) and schedule an appointment.  Please make sure when you call that you mention that you are scheduling your Annual Wellness Visit with the clinical pharmacist so that the appointment may be made for the proper length of time.     Continue current medications. Continue good therapeutic lifestyle changes which include good diet and exercise. Fall precautions discussed with patient. If an FOBT was given today- please return it to our front desk. If you are over 12 years old - you may need Prevnar 60 or the adult Pneumonia vaccine.  **Flu shots are available--- please call and schedule a FLU-CLINIC appointment**  After your visit with [place] today you will receive a survey in the mail or online from [employer] regarding your care with [place]. Please take a moment to fill this out. Your feedback is very important to [place] as you can help [place] better understand your patient needs as well as improve your experience and satisfaction. WE CARE ABOUT YOU!!!   Continue with current treatment regimen Continue to drink plenty of water and fluids Continue with Repatha Recheck BMP and lipid liver panel in 3 months Stay active physically Remember that next colonoscopy would be due in 2023 if the gastroenterologist decides to do this. You can purchase Debrox eardrops over-the-counter to help soften earwax.  The way these are used to apply 3 to 4 drops nightly for 3 nights in a row wait a week and reapply and this will help soften the earwax so that it comes out more easily.  Follow-up with Dr. Debara Pickett, your cardiologist as he scheduled with you.   Arrie Senate MD

## 2017-11-13 ENCOUNTER — Ambulatory Visit (INDEPENDENT_AMBULATORY_CARE_PROVIDER_SITE_OTHER): Payer: Medicare HMO | Admitting: Internal Medicine

## 2017-11-13 ENCOUNTER — Other Ambulatory Visit: Payer: Self-pay | Admitting: Physician Assistant

## 2017-11-13 ENCOUNTER — Encounter: Payer: Self-pay | Admitting: Internal Medicine

## 2017-11-13 VITALS — BP 158/72 | HR 70 | Ht 66.0 in | Wt 178.0 lb

## 2017-11-13 DIAGNOSIS — L219 Seborrheic dermatitis, unspecified: Secondary | ICD-10-CM | POA: Diagnosis not present

## 2017-11-13 DIAGNOSIS — I6523 Occlusion and stenosis of bilateral carotid arteries: Secondary | ICD-10-CM | POA: Diagnosis not present

## 2017-11-13 DIAGNOSIS — E785 Hyperlipidemia, unspecified: Secondary | ICD-10-CM

## 2017-11-13 DIAGNOSIS — D0439 Carcinoma in situ of skin of other parts of face: Secondary | ICD-10-CM | POA: Diagnosis not present

## 2017-11-13 DIAGNOSIS — L570 Actinic keratosis: Secondary | ICD-10-CM | POA: Diagnosis not present

## 2017-11-13 NOTE — Progress Notes (Addendum)
OFFICE NOTE  Chief Complaint:  No complaints  Primary Care Physician: Chipper Herb, MD  HPI:  Carol [name] is a pleasant 72 year old female kindly referred to me by Dr. Laurance Flatten for cardiac evaluation. Recently she underwent lifeline screening was found to have carotid stenosis. She had a formal carotid Doppler performed at [facility] which demonstrated mild to moderate bilateral carotid artery disease. She's been adequately managed in fact is on no Vytorin and 5 fenofibrate for elevated cholesterol. Recent labs indicated an LDL-P of 1143, LDL 95, HDL 68 and triglycerides 68. Generally this is good control however given the fact that she has significant carotid artery disease, her targets for cholesterol management are much lower. We should strive for a LDL cholesterol less than 70. She's a symptomatically regards to chest pain or shortness of breath. Her brother recently died with heart attack at age 65 and of course she is appropriately concerned about that. She also has history of murmur which is been followed.  07/27/2015  Carol [name] returns today for follow-up. She seems to be doing quite well. She is physically active and walks about 3 miles several times a week. She recently had repeat carotid Dopplers which show moderate right internal carotid artery stenosis and mild left internal carotid artery stenosis which is stable compared to her studies a year ago. She also had repeat lipid testing which shows very good control at this point although triglycerides were somewhat elevated. She was started on fenofibrate in addition to her Crestor, and seems to be tolerating this well over the past month. She denies any myalgias. She is on daily aspirin. She denies any chest pain or worsening shortness of breath.  07/26/2016  Carol [name] returns for follow-up. She underwent carotid Dopplers this morning which preliminarily looks stable with a 40-59% R ICA stenosis and a 9-56% LICA stenosis. She  is asymptomatic denies any chest pain or worsening shortness of breath. There is a strong family history of carotid artery disease as well as a coronary artery disease in the family. Her PCP has been adjusting her medications as she's had side effects with the statins in the past. She was intolerant to Crestor however this was in addition of fenofibrate. She was also previously on Vytorin 10/20 mg in addition to fenofibrate 148 mg and had side effects. She also states that she felt she had side effects off of fenofibrate. Is not clear if it was just a combination of medications or the statin alone. Currently she is on pitavastatin, but she reports her medications very expensive. Her recent lipid profile 2 weeks ago showed total cholesterol 188, triglycerides 241, HDL-C 47 and LDL-C 93. Goal LDL-C less than 70. Her PCP recommended ezetimibe however she was hesitant to take it as she said it did not work in the past. She did not realize though that Vytorin contained ezetimibe.  07/27/2017  Carol [name] was seen today in follow-up.  She underwent carotid Dopplers today and the official read is not yet performed.  This demonstrated 40 to 59% stenosis in the right and less than 39% stenosis on the left.  This is consistent with her prior values and it is recommended she have follow-up Dopplers in a year.  In the past however we have had difficulty managing her cholesterol.  She is currently only taking 2.5 mg of rosuvastatin daily.  This is about the most that she can manage.  She had significant intolerance to both atorvastatin and rosuvastatin at  doses of 20 and 40 mg.  Labs in March 2019 showed total LDL-PA 1634, LDL-C of 148, indicating significant residual risk.  Her goal LDL should be less than 70.  We previously discussed other management options including ezetimibe and possible PCSK9 inhibitor therapy.  11/13/2017  Carol [name] is seen today in follow-up.  Overall she is doing well.  I recently had lab work  from her PCP.  She seems to be responding to PCSK9 inhibitor.  Her total cholesterol is decreased remarkably from 2 72-1 24, triglycerides went up from 114-163, HDL increased from 47-52 and LDL-C is down from 192-49.  She is also tolerating this well without side effects.  PMHx:  Past Medical History:  Diagnosis Date  . Carotid artery stenosis   . Cataract   . Diverticulosis   . Fractured hand    right  . Hyperlipidemia   . Osteopenia   . Precancerous skin lesion 2016    Past Surgical History:  Procedure Laterality Date  . CATARACT EXTRACTION W/ INTRAOCULAR LENS IMPLANT Bilateral   . CHOLECYSTECTOMY  2009  . EYE SURGERY      FAMHx:  Family History  Problem Relation Age of Onset  . Heart disease Mother   . Heart disease Father   . Cancer Sister        breast  . Hyperlipidemia Sister   . Diabetes Sister   . Cancer Sister        bone  . Alzheimer's disease Sister   . Heart disease Brother   . CAD Brother   . Cancer Sister        breast  . Kidney disease Sister   . Heart disease Brother   . Hypertension Brother   . Alzheimer's disease Brother   . Kidney disease Brother        2018 starting dialysis    SOCHx:   reports that she has never smoked. She has never used smokeless tobacco. She reports that she does not drink alcohol or use drugs.  ALLERGIES:  Allergies  Allergen Reactions  . Penicillins Hives  . Sulfa Antibiotics   . Sulfamethoxazole Itching  . Lipitor [Atorvastatin] Diarrhea  . Crestor [Rosuvastatin Calcium] Other (See Comments)    Myalgias     ROS: Pertinent items noted in HPI and remainder of comprehensive ROS otherwise negative.  HOME MEDS: Current Outpatient Medications  Medication Sig Dispense Refill  . aspirin EC 81 MG tablet Take 81 mg by mouth daily.    . Calcium 500-125 MG-UNIT TABS 1 tablet. 1 tablet daily.    . Cholecalciferol (VITAMIN D3) 2000 units capsule 1 capsule.    . Evolocumab (REPATHA SURECLICK) 973 MG/ML SOAJ Inject 1  Dose into the skin every 14 (fourteen) days. 6 pen 3  . Multiple Vitamins tablet 1 tablet. 1 tablet daily.    . Omega-3 Fatty Acids ([facility] FISH OIL) 1200 MG CAPS Take 4 capsules by mouth every morning.    . rosuvastatin (CRESTOR) 5 MG tablet Take 2.5 mg by mouth daily.     No current facility-administered medications for this visit.     LABS/IMAGING: No results found for this or any previous visit (from the past 48 hour(s)). No results found.  WEIGHTS: Wt Readings from Last 3 Encounters:  11/13/17 178 lb (80.7 kg)  11/06/17 177 lb (80.3 kg)  07/27/17 177 lb 12.8 oz (80.6 kg)    VITALS: BP (!) 158/72   Pulse 70   Ht 5\' 6"  (1.676 m)  Wt 178 lb (80.7 kg)   SpO2 96%   BMI 28.73 kg/m   EXAM: Deferred  EKG: Deferred  ASSESSMENT: 1. Bilateral carotid artery stenosis 2. Dyslipidemia 3. Family history of premature coronary artery disease  PLAN: 1.   Carol [name] has had a great response to PCSK9 inhibitor therapy. Hopefully this will slow the progression of her carotid artery disease. Will continue with her current medication.  Follow-up with me in 6 months.  Pixie Casino, MD, [facility], [place] Director of the Advanced Lipid Disorders &  Cardiovascular Risk Reduction Clinic Diplomate of the American Board of Clinical Lipidology Attending Cardiologist  Direct Dial: [phone]  Fax: [phone]  Website:  [name] 11/13/2017, 10:42 AM

## 2017-11-13 NOTE — Patient Instructions (Signed)
Your physician wants you to follow-up in: 6 months with Dr. Hilty. You will receive a reminder letter in the mail two months in advance. If you don't receive a letter, please call our office to schedule the follow-up appointment.    

## 2017-11-14 ENCOUNTER — Other Ambulatory Visit: Payer: Medicare HMO

## 2017-11-14 DIAGNOSIS — Z1211 Encounter for screening for malignant neoplasm of colon: Secondary | ICD-10-CM | POA: Diagnosis not present

## 2017-11-16 ENCOUNTER — Encounter: Payer: Self-pay | Admitting: Internal Medicine

## 2017-11-16 LAB — FECAL OCCULT BLOOD, IMMUNOCHEMICAL: FECAL OCCULT BLD: NEGATIVE

## 2017-11-22 ENCOUNTER — Encounter: Payer: Self-pay | Admitting: Pediatrics

## 2017-11-22 ENCOUNTER — Ambulatory Visit (INDEPENDENT_AMBULATORY_CARE_PROVIDER_SITE_OTHER): Payer: Medicare HMO | Admitting: Pediatrics

## 2017-11-22 VITALS — BP 181/76 | HR 50 | Temp 97.0°F | Ht 66.0 in | Wt 180.2 lb

## 2017-11-22 DIAGNOSIS — R030 Elevated blood-pressure reading, without diagnosis of hypertension: Secondary | ICD-10-CM | POA: Diagnosis not present

## 2017-11-22 DIAGNOSIS — H65111 Acute and subacute allergic otitis media (mucoid) (sanguinous) (serous), right ear: Secondary | ICD-10-CM

## 2017-11-22 MED ORDER — AZITHROMYCIN 250 MG PO TABS
ORAL_TABLET | ORAL | 0 refills | Status: DC
Start: 2017-11-22 — End: 2018-05-07

## 2017-11-22 NOTE — Progress Notes (Signed)
  Subjective:   Patient ID: Carol [name], female    DOB: Aug 30, 1945, 72 y.o.   MRN: [id number] CC: Ear Pain (Right)  HPI: Carol [name] is a 72 y.o. female   Right ear started bothering her yesterday.  She is had upper respiratory symptoms for last 2 to 3 days.  Generally very healthy, stays very active, outside regularly.  Elevated blood pressure: Blood pressures at home she checks regularly, systolics usually in the 165V over diastolics of 37S.  No headaches, vision changes.  Relevant past medical, surgical, family and social history reviewed. Allergies and medications reviewed and updated. Social History   Tobacco Use  Smoking Status Never Smoker  Smokeless Tobacco Never Used   ROS: Per HPI   Objective:    BP (!) 181/76   Pulse (!) 50   Temp (!) 97 F (36.1 C) (Oral)   Ht 5\' 6"  (1.676 m)   Wt 180 lb 3.2 oz (81.7 kg)   BMI 29.09 kg/m   Wt Readings from Last 3 Encounters:  11/22/17 180 lb 3.2 oz (81.7 kg)  11/13/17 178 lb (80.7 kg)  11/06/17 177 lb (80.3 kg)    Gen: NAD, alert, cooperative with exam, NCAT EYES: EOMI, no conjunctival injection, or no icterus ENT: Right TM pink, large layering effusion, left TM normal, OP with mild erythema LYMPH: no cervical LAD CV: NRRR, normal S1/S2, no murmur, distal pulses 2+ b/l Resp: CTABL, no wheezes, normal WOB Abd: +BS, soft, NTND. no guarding or organomegaly Ext: No edema, warm Neuro: Alert and oriented, strength equal b/l UE and LE, coordination grossly normal  Assessment & Plan:  Carol [name] was seen today for ear pain.  Diagnoses and all orders for this visit:  Acute mucoid otitis media of right ear -     azithromycin (ZITHROMAX) 250 MG tablet; Take 2 the first day and then one each day after.  Elevated blood pressure reading Check blood pressures at home, let us know if regularly greater than 140 on top or greater than 90 on bottom.  Follow up plan: Return if symptoms worsen or fail to improve. Assunta Found,  MD [place]

## 2017-11-27 ENCOUNTER — Encounter: Payer: Self-pay | Admitting: *Deleted

## 2017-11-27 ENCOUNTER — Ambulatory Visit (INDEPENDENT_AMBULATORY_CARE_PROVIDER_SITE_OTHER): Payer: Medicare HMO | Admitting: *Deleted

## 2017-11-27 VITALS — BP 147/70 | HR 58 | Ht 66.0 in | Wt 181.0 lb

## 2017-11-27 DIAGNOSIS — Z0000 Encounter for general adult medical examination without abnormal findings: Secondary | ICD-10-CM

## 2017-11-27 NOTE — Progress Notes (Signed)
Subjective:   Carol [name] is a 72 y.o. female who presents for Medicare Annual (Subsequent) preventive examination.  Ms. Carol [name] retired from [employer] in 2008.  She enjoys watching golf, walking, yard work, and traveling.  She lives alone, her husband is deceased.  Carol [name] has 2 children who live locally, she also has 2 grand children who are in college.  She is neighbors with several family members who live on their family's property including a sister and brother in [place].  She states her family still grows many of its own vegetables and cans or freezes them each year.  Carol [name] feels her health is unchanged from last year.  She reports no hospitalizations or surgeries in the past year.   Review of Systems:  Musculoskeletal - back pain All other systems negative        Objective:     Vitals: BP (!) 147/70   Pulse (!) 58   Ht 5\' 6"  (1.676 m)   Wt 181 lb (82.1 kg)   BMI 29.21 kg/m   Body mass index is 29.21 kg/m.  Advanced Directives 11/27/2017 02/03/2016 10/07/2014  Does Patient Have a Medical Advance Directive? Yes Yes Yes  Type of Paramedic of [place];Living will [place];Living will [place];Living will  Does patient want to make changes to medical advance directive? No - Patient declined No - Patient declined Yes - information given  Copy of [place] in Chart? No - copy requested No - copy requested No - copy requested  Would patient like information on creating a medical advance directive? No - Patient declined - -    Tobacco Social History   Tobacco Use  Smoking Status Never Smoker  Smokeless Tobacco Never Used     Counseling given: No   Clinical Intake:     Pain Score: 2                  Past Medical History:  Diagnosis Date  . Carotid artery stenosis   . Cataract   . Diverticulosis   . Fractured hand    right  . Hyperlipidemia   .  Osteopenia   . Precancerous skin lesion 2016   Past Surgical History:  Procedure Laterality Date  . CATARACT EXTRACTION W/ INTRAOCULAR LENS IMPLANT Bilateral   . CHOLECYSTECTOMY  2009  . EYE SURGERY     Family History  Problem Relation Age of Onset  . Heart disease Mother   . Heart disease Father   . Cancer Sister        breast  . Hyperlipidemia Sister   . Diabetes Sister   . Cancer Sister        bone  . Alzheimer's disease Sister   . Heart disease Brother   . CAD Brother   . Cancer Sister        breast  . Kidney disease Sister   . Heart disease Brother   . Hypertension Brother   . Alzheimer's disease Brother   . Kidney disease Brother        2018 starting dialysis   Social History   Socioeconomic History  . Marital status: Widowed    Spouse name: Not on file  . Number of children: Not on file  . Years of education: Not on file  . Highest education level: Not on file  Occupational History  . Not on file  Social Needs  . Emergency planning/management officer  strain: Not on file  . Food insecurity:    Worry: Not on file    Inability: Not on file  . Transportation needs:    Medical: Not on file    Non-medical: Not on file  Tobacco Use  . Smoking status: Never Smoker  . Smokeless tobacco: Never Used  Substance and Sexual Activity  . Alcohol use: No  . Drug use: No  . Sexual activity: Not on file  Lifestyle  . Physical activity:    Days per week: Not on file    Minutes per session: Not on file  . Stress: Not on file  Relationships  . Social connections:    Talks on phone: Not on file    Gets together: Not on file    Attends religious service: Not on file    Active member of club or organization: Not on file    Attends meetings of clubs or organizations: Not on file    Relationship status: Not on file  Other Topics Concern  . Not on file  Social History Narrative  . Not on file    Outpatient Encounter Medications as of 11/27/2017  Medication Sig  . aspirin EC 81 MG  tablet Take 81 mg by mouth daily.  Marland Kitchen azithromycin (ZITHROMAX) 250 MG tablet Take 2 the first day and then one each day after.  . Calcium 500-125 MG-UNIT TABS 1 tablet. 1 tablet daily.  . Cholecalciferol (VITAMIN D3) 2000 units capsule 1 capsule.  . Evolocumab (REPATHA SURECLICK) 213 MG/ML SOAJ Inject 1 Dose into the skin every 14 (fourteen) days.  . Multiple Vitamins tablet 1 tablet. 1 tablet daily.  . rosuvastatin (CRESTOR) 5 MG tablet Take 2.5 mg by mouth daily.  . [DISCONTINUED] Omega-3 Fatty Acids ([facility]S FISH OIL) 1200 MG CAPS Take 4 capsules by mouth every morning.   No facility-administered encounter medications on file as of 11/27/2017.     Activities of Daily Living In your present state of health, do you have any difficulty performing the following activities: 11/27/2017  Hearing? Y  Comment Wears bilateral hearing aids  Vision? N  Difficulty concentrating or making decisions? N  Walking or climbing stairs? N  Dressing or bathing? N  Doing errands, shopping? N  Preparing Food and eating ? N  Using the Toilet? N  In the past six months, have you accidently leaked urine? N  Do you have problems with loss of bowel control? N  Managing your Medications? N  Managing your Finances? N  Housekeeping or managing your Housekeeping? N  Some recent data might be hidden    Patient Care Team: Carol Herb, MD as PCP - General (Family Medicine) Carol Monarch, MD as Consulting Physician (Dermatology) Carol Mutter, MD as Consulting Physician (Ophthalmology) Carol Lobo, MD as Consulting Physician (Gastroenterology) Carol Pickett Nadean Corwin, MD as Consulting Physician (Cardiology)    Assessment:   This is a routine wellness examination for Carol [name].  Exercise Activities and Dietary recommendations  Ms. Shadd states she usually has cheerios, milk and coffee for breakfast, a sandwich for lunch, and meat and vegetables for supper.  She states she drinks several Pepsis per week, and  would like to decrease her intake of these to help lose weight. Recommended diet of mostly vegetables, fruits, lean proteins, and whole grains.     Current Exercise Habits: Home exercise routine, Type of exercise: walking, Time (Minutes): 60, Frequency (Times/Week): 3, Weekly Exercise (Minutes/Week): 180, Intensity: Moderate  Goals    . Weight (lb) <  170 lb (77.1 kg)     Reduce soda consumption to one per week.        Fall Risk Fall Risk  11/27/2017 11/22/2017 11/06/2017 05/02/2017 12/29/2016  Falls in the past year? No No No No No  Number falls in past yr: - - - - -  Injury with Fall? - - - - -  Comment - - - - -  Follow up - - - - -   Is the patient's home free of loose throw rugs in walkways, pet beds, electrical cords, etc?   yes      Grab bars in the bathroom? yes      Handrails on the stairs?   yes      Adequate lighting?   yes   Depression Screen PHQ 2/9 Scores 11/27/2017 11/22/2017 11/06/2017 05/29/2017  PHQ - 2 Score 0 0 0 0     Cognitive Function MMSE - Mini Mental State Exam 11/27/2017 02/03/2016 02/03/2016 10/07/2014  Orientation to time 5 5 5 5   Orientation to Place 5 5 5 5   Registration 3 3 - 3  Attention/ Calculation 5 5 5 5   Recall 3 2 - 3  Language- name 2 objects 2 2 - 2  Language- repeat 1 1 - 1  Language- follow 3 step command 3 3 - 3  Language- read & follow direction 1 1 - 1  Write a sentence 1 1 - 1  Copy design 1 1 - 1  Total score 30 29 - 30        Immunization History  Administered Date(s) Administered  . Influenza,inj,Quad PF,6+ Mos 11/26/2012, 11/26/2013, 12/04/2014  . Tdap 02/27/2006  . Zoster 12/19/2013    Qualifies for Shingles Vaccine? Yes, declined today  Screening Tests Health Maintenance  Topic Date Due  . INFLUENZA VACCINE  12/29/2017 (Originally 09/27/2017)  . TETANUS/TDAP  05/03/2018 (Originally 02/28/2016)  . PNA vac Low Risk Adult (1 of 2 - PCV13) 05/03/2018 (Originally 09/24/2008)  . MAMMOGRAM  01/15/2018  . DEXA SCAN  12/30/2018    . COLONOSCOPY  05/24/2021  . Hepatitis C Screening  Completed   Patient declined flu vaccine, Tdap and prevnar 13 today She has her mammograms done at [facility].  She plans to call and schedule her mammogram for November.    Cancer Screenings: Lung: Low Dose CT Chest recommended if Age 10-80 years, 30 pack-year currently smoking OR have quit w/in 15years. Patient does not qualify. Breast:  Up to date on Mammogram? Yes   Up to date of Bone Density/Dexa? Yes Colorectal: up to date  Additional Screenings: : Hepatitis C Screening:  Completed 10/08/14     Plan:      Bring a copy of your Advance Directives to our office to be filed in your medical record. Work on your goal of decreasing your soda intake to one per week.  Consider getting the new shingles vaccine (Shingrix).    I have personally reviewed and noted the following in the patient's chart:   . Medical and social history . Use of alcohol, tobacco or illicit drugs  . Current medications and supplements . Functional ability and status . Nutritional status . Physical activity . Advanced directives . List of other physicians . Hospitalizations, surgeries, and ER visits in previous 12 months . Vitals . Screenings to include cognitive, depression, and falls . Referrals and appointments  In addition, I have reviewed and discussed with patient certain preventive protocols, quality metrics, and best practice recommendations.  A written personalized care plan for preventive services as well as general preventive health recommendations were provided to patient.     Denyce Robert, RN  11/27/2017

## 2017-11-27 NOTE — Patient Instructions (Addendum)
At your convenience, please bring a copy of your Advance Directives to our office to be filed in your medical record.  Please work on your goal of decreasing your soda intake to one per week.   Please consider getting the new shingles vaccine (Shingrix).   Thank you for coming in for your Annual Wellness Visit today!

## 2017-12-12 DIAGNOSIS — M47816 Spondylosis without myelopathy or radiculopathy, lumbar region: Secondary | ICD-10-CM | POA: Diagnosis not present

## 2017-12-12 DIAGNOSIS — S338XXA Sprain of other parts of lumbar spine and pelvis, initial encounter: Secondary | ICD-10-CM | POA: Diagnosis not present

## 2017-12-12 DIAGNOSIS — M9903 Segmental and somatic dysfunction of lumbar region: Secondary | ICD-10-CM | POA: Diagnosis not present

## 2017-12-13 DIAGNOSIS — M9903 Segmental and somatic dysfunction of lumbar region: Secondary | ICD-10-CM | POA: Diagnosis not present

## 2017-12-13 DIAGNOSIS — M47816 Spondylosis without myelopathy or radiculopathy, lumbar region: Secondary | ICD-10-CM | POA: Diagnosis not present

## 2017-12-13 DIAGNOSIS — S338XXA Sprain of other parts of lumbar spine and pelvis, initial encounter: Secondary | ICD-10-CM | POA: Diagnosis not present

## 2017-12-19 ENCOUNTER — Other Ambulatory Visit: Payer: Self-pay | Admitting: Family Medicine

## 2017-12-19 DIAGNOSIS — Z1231 Encounter for screening mammogram for malignant neoplasm of breast: Secondary | ICD-10-CM

## 2017-12-24 DIAGNOSIS — M47816 Spondylosis without myelopathy or radiculopathy, lumbar region: Secondary | ICD-10-CM | POA: Diagnosis not present

## 2017-12-24 DIAGNOSIS — S338XXA Sprain of other parts of lumbar spine and pelvis, initial encounter: Secondary | ICD-10-CM | POA: Diagnosis not present

## 2017-12-24 DIAGNOSIS — M9903 Segmental and somatic dysfunction of lumbar region: Secondary | ICD-10-CM | POA: Diagnosis not present

## 2017-12-27 DIAGNOSIS — M47816 Spondylosis without myelopathy or radiculopathy, lumbar region: Secondary | ICD-10-CM | POA: Diagnosis not present

## 2017-12-27 DIAGNOSIS — M9903 Segmental and somatic dysfunction of lumbar region: Secondary | ICD-10-CM | POA: Diagnosis not present

## 2017-12-27 DIAGNOSIS — S338XXA Sprain of other parts of lumbar spine and pelvis, initial encounter: Secondary | ICD-10-CM | POA: Diagnosis not present

## 2018-01-17 ENCOUNTER — Ambulatory Visit (HOSPITAL_COMMUNITY)
Admission: RE | Admit: 2018-01-17 | Discharge: 2018-01-17 | Disposition: A | Discharge status: Home / Self Care | Payer: Medicare HMO | Source: Ambulatory Visit | Attending: Family Medicine | Admitting: Family Medicine

## 2018-01-17 DIAGNOSIS — Z1231 Encounter for screening mammogram for malignant neoplasm of breast: Secondary | ICD-10-CM | POA: Insufficient documentation

## 2018-01-29 ENCOUNTER — Telehealth: Payer: Self-pay | Admitting: Internal Medicine

## 2018-01-29 DIAGNOSIS — M79671 Pain in right foot: Secondary | ICD-10-CM | POA: Diagnosis not present

## 2018-01-29 DIAGNOSIS — M722 Plantar fascial fibromatosis: Secondary | ICD-10-CM | POA: Diagnosis not present

## 2018-01-29 NOTE — Telephone Encounter (Signed)
PA for repatha submitted via [email or web address]  (Key: [id number])

## 2018-01-29 NOTE — Telephone Encounter (Signed)
Per message received from [email or web address], no new PA is needed. [facility] and this was confirmed. They also do not show an active 2020 plan.   Called patient to inquire about PartD drug coverage - LMTCB

## 2018-01-30 NOTE — Telephone Encounter (Signed)
° °  Patient calling back to provide insurance coverage details.   [facility] Medicare [facility] Medicare Advantage Id # [id number] Group # [id number] Effective 02/27/18  Rx BIN # [id number] Rx PCN # [facility] Rx Group # [id number]  Customer Service # [id number]

## 2018-02-06 NOTE — Telephone Encounter (Signed)
Spoke with [employer] rep. Patient has changed insurance from [place] to [place] and a PA for repatha cannot be submitted until on or after Feb 27, 2018.

## 2018-02-06 NOTE — Telephone Encounter (Signed)
LMTCB to discuss repatha - notified her via VM that PA cannot be completed until 02/27/2018 and asked if she had medication to last her until then

## 2018-02-07 ENCOUNTER — Telehealth: Payer: Self-pay | Admitting: Internal Medicine

## 2018-02-07 NOTE — Telephone Encounter (Signed)
Returned call to patient left message on personal voice mail I will send message to Dr.Hilty's RN you have enough Repatha until 1/20.

## 2018-02-07 NOTE — Telephone Encounter (Signed)
Follow up   Patient is returning call about repatha. Please call.

## 2018-02-07 NOTE — Telephone Encounter (Signed)
New Message   Pt states she is returning call for nurse and wants to let her know that she does have enough Repatha to last her through January

## 2018-02-07 NOTE — Telephone Encounter (Signed)
LM for patient that her previous message was received that she DOES have enough Repatha to last until Jan 2020 and a PA will be completed after Feb 27 2018

## 2018-02-21 ENCOUNTER — Other Ambulatory Visit: Payer: Self-pay | Admitting: *Deleted

## 2018-02-21 ENCOUNTER — Other Ambulatory Visit: Payer: Medicare HMO

## 2018-02-21 DIAGNOSIS — E785 Hyperlipidemia, unspecified: Secondary | ICD-10-CM | POA: Diagnosis not present

## 2018-02-21 DIAGNOSIS — E559 Vitamin D deficiency, unspecified: Secondary | ICD-10-CM

## 2018-02-21 DIAGNOSIS — R030 Elevated blood-pressure reading, without diagnosis of hypertension: Secondary | ICD-10-CM

## 2018-02-21 DIAGNOSIS — E7800 Pure hypercholesterolemia, unspecified: Secondary | ICD-10-CM

## 2018-02-21 DIAGNOSIS — Z8249 Family history of ischemic heart disease and other diseases of the circulatory system: Secondary | ICD-10-CM | POA: Diagnosis not present

## 2018-02-22 LAB — BMP8+EGFR
BUN / CREAT RATIO: 16 (ref 12–28)
BUN: 16 mg/dL (ref 8–27)
CO2: 22 mmol/L (ref 20–29)
CREATININE: 1.03 mg/dL — AB (ref 0.57–1.00)
Calcium: 9.1 mg/dL (ref 8.7–10.3)
Chloride: 107 mmol/L — ABNORMAL HIGH (ref 96–106)
GFR calc Af Amer: 62 mL/min/{1.73_m2} (ref >59)
GFR, EST NON AFRICAN AMERICAN: 54 mL/min/{1.73_m2} — AB (ref >59)
GLUCOSE: 100 mg/dL — AB (ref 65–99)
Potassium: 4.1 mmol/L (ref 3.5–5.2)
SODIUM: 144 mmol/L (ref 134–144)

## 2018-02-22 LAB — CBC WITH DIFFERENTIAL/PLATELET
BASOS ABS: 0 10*3/uL (ref 0.0–0.2)
Basos: 1 %
EOS (ABSOLUTE): 0.2 10*3/uL (ref 0.0–0.4)
Eos: 3 %
HEMATOCRIT: 41 % (ref 34.0–46.6)
HEMOGLOBIN: 13.8 g/dL (ref 11.1–15.9)
Immature Grans (Abs): 0 10*3/uL (ref 0.0–0.1)
Immature Granulocytes: 0 %
LYMPHS ABS: 2.3 10*3/uL (ref 0.7–3.1)
Lymphs: 35 %
MCH: 30.9 pg (ref 26.6–33.0)
MCHC: 33.7 g/dL (ref 31.5–35.7)
MCV: 92 fL (ref 79–97)
MONOCYTES: 7 %
Monocytes Absolute: 0.5 10*3/uL (ref 0.1–0.9)
NEUTROS ABS: 3.5 10*3/uL (ref 1.4–7.0)
Neutrophils: 54 %
Platelets: 203 10*3/uL (ref 150–450)
RBC: 4.46 x10E6/uL (ref 3.77–5.28)
RDW: 13 % (ref 12.3–15.4)
WBC: 6.5 10*3/uL (ref 3.4–10.8)

## 2018-02-22 LAB — HEPATIC FUNCTION PANEL
ALBUMIN: 4.5 g/dL (ref 3.5–4.8)
ALT: 17 IU/L (ref 0–32)
AST: 22 IU/L (ref 0–40)
Alkaline Phosphatase: 70 IU/L (ref 39–117)
BILIRUBIN TOTAL: 0.5 mg/dL (ref 0.0–1.2)
Bilirubin, Direct: 0.15 mg/dL (ref 0.00–0.40)
Total Protein: 7 g/dL (ref 6.0–8.5)

## 2018-02-22 LAB — LIPID PANEL
CHOL/HDL RATIO: 1.9 ratio (ref 0.0–4.4)
Cholesterol, Total: 116 mg/dL (ref 100–199)
HDL: 61 mg/dL (ref >39)
LDL Calculated: 28 mg/dL (ref 0–99)
TRIGLYCERIDES: 135 mg/dL (ref 0–149)
VLDL Cholesterol Cal: 27 mg/dL (ref 5–40)

## 2018-02-22 LAB — VITAMIN D 25 HYDROXY (VIT D DEFICIENCY, FRACTURES): Vit D, 25-Hydroxy: 34.6 ng/mL (ref 30.0–100.0)

## 2018-02-26 DIAGNOSIS — M79671 Pain in right foot: Secondary | ICD-10-CM | POA: Diagnosis not present

## 2018-02-26 DIAGNOSIS — M722 Plantar fascial fibromatosis: Secondary | ICD-10-CM | POA: Diagnosis not present

## 2018-02-28 ENCOUNTER — Telehealth: Payer: Self-pay | Admitting: Internal Medicine

## 2018-02-28 NOTE — Telephone Encounter (Signed)
PA for Repatha [employer] submitted via [email or web address]  (Key: [id number])  Patient has new insurance for 2020:  [employer] [place] [employer]

## 2018-02-28 NOTE — Telephone Encounter (Signed)
Your request has been approved  Effective from 02/28/2018 through 03/01/2019

## 2018-03-01 MED ORDER — EVOLOCUMAB 140 MG/ML ~~LOC~~ SOAJ: 1.0000 | SUBCUTANEOUS | 3 refills | Status: DC

## 2018-03-01 NOTE — Telephone Encounter (Signed)
Spoke with patient about Repatha approval. She state she is unsure what specialty pharmacy she will need to use now since she changed insurances from 2019 to 2020. She provided me with [facility] customer service # [phone]  Called [employer] [place] [employer] - [facility]   Spent 25+ minutes on phone investigating this for patient.

## 2018-03-01 NOTE — Telephone Encounter (Signed)
Message sent to patient via [place] notifying her of approval

## 2018-03-01 NOTE — Addendum Note (Signed)
Addended by: Fidel Levy on: 03/01/2018 10:32 AM   Modules accepted: Orders

## 2018-03-06 ENCOUNTER — Other Ambulatory Visit: Payer: Self-pay

## 2018-03-06 ENCOUNTER — Telehealth: Payer: Self-pay | Admitting: Internal Medicine

## 2018-03-06 MED ORDER — EVOLOCUMAB 140 MG/ML ~~LOC~~ SOAJ: 1.0000 | SUBCUTANEOUS | 3 refills | Status: DC

## 2018-03-06 NOTE — Telephone Encounter (Signed)
I called patient, she states that she spoke with [employer] this morning and they told her she could not use [facility] any longer for that medication, although that is what was told to [place], RN last week when she called her insurance. Patient would like to send the [place] into [facility] in [place] to see if it makes any difference. I advised patient I would send it and to call if any issues.   Patient verbalized understanding

## 2018-03-06 NOTE — Telephone Encounter (Signed)
New message   Patient states that she [facility] per the message on 03/01/2018. Please call the patient to discuss.

## 2018-03-19 DIAGNOSIS — M722 Plantar fascial fibromatosis: Secondary | ICD-10-CM | POA: Diagnosis not present

## 2018-03-19 DIAGNOSIS — M79671 Pain in right foot: Secondary | ICD-10-CM | POA: Diagnosis not present

## 2018-04-10 DIAGNOSIS — Z0120 Encounter for dental examination and cleaning without abnormal findings: Secondary | ICD-10-CM | POA: Diagnosis not present

## 2018-05-02 ENCOUNTER — Other Ambulatory Visit: Payer: Medicare Other

## 2018-05-02 ENCOUNTER — Other Ambulatory Visit: Payer: Self-pay | Admitting: *Deleted

## 2018-05-02 DIAGNOSIS — I6523 Occlusion and stenosis of bilateral carotid arteries: Secondary | ICD-10-CM

## 2018-05-02 DIAGNOSIS — E559 Vitamin D deficiency, unspecified: Secondary | ICD-10-CM

## 2018-05-02 DIAGNOSIS — E7800 Pure hypercholesterolemia, unspecified: Secondary | ICD-10-CM | POA: Diagnosis not present

## 2018-05-02 DIAGNOSIS — R030 Elevated blood-pressure reading, without diagnosis of hypertension: Secondary | ICD-10-CM | POA: Diagnosis not present

## 2018-05-03 ENCOUNTER — Telehealth: Payer: Self-pay | Admitting: Family Medicine

## 2018-05-03 LAB — BMP8+EGFR
BUN/Creatinine Ratio: 17 (ref 12–28)
BUN: 16 mg/dL (ref 8–27)
CO2: 24 mmol/L (ref 20–29)
Calcium: 9.7 mg/dL (ref 8.7–10.3)
Chloride: 107 mmol/L — ABNORMAL HIGH (ref 96–106)
Creatinine, Ser: 0.96 mg/dL (ref 0.57–1.00)
GFR, EST AFRICAN AMERICAN: 67 mL/min/{1.73_m2} (ref >59)
GFR, EST NON AFRICAN AMERICAN: 58 mL/min/{1.73_m2} — AB (ref >59)
Glucose: 100 mg/dL — ABNORMAL HIGH (ref 65–99)
POTASSIUM: 5.2 mmol/L (ref 3.5–5.2)
SODIUM: 146 mmol/L — AB (ref 134–144)

## 2018-05-03 LAB — CBC WITH DIFFERENTIAL/PLATELET
BASOS: 0 %
Basophils Absolute: 0 10*3/uL (ref 0.0–0.2)
EOS (ABSOLUTE): 0.2 10*3/uL (ref 0.0–0.4)
EOS: 3 %
HEMATOCRIT: 45 % (ref 34.0–46.6)
Hemoglobin: 14.5 g/dL (ref 11.1–15.9)
Immature Grans (Abs): 0 10*3/uL (ref 0.0–0.1)
Immature Granulocytes: 0 %
LYMPHS ABS: 2.4 10*3/uL (ref 0.7–3.1)
Lymphs: 35 %
MCH: 30 pg (ref 26.6–33.0)
MCHC: 32.2 g/dL (ref 31.5–35.7)
MCV: 93 fL (ref 79–97)
MONOS ABS: 0.5 10*3/uL (ref 0.1–0.9)
Monocytes: 7 %
Neutrophils Absolute: 3.9 10*3/uL (ref 1.4–7.0)
Neutrophils: 55 %
PLATELETS: 216 10*3/uL (ref 150–450)
RBC: 4.83 x10E6/uL (ref 3.77–5.28)
RDW: 13 % (ref 11.7–15.4)
WBC: 7 10*3/uL (ref 3.4–10.8)

## 2018-05-03 LAB — HEPATIC FUNCTION PANEL
ALK PHOS: 72 IU/L (ref 39–117)
ALT: 15 IU/L (ref 0–32)
AST: 17 IU/L (ref 0–40)
Albumin: 4.5 g/dL (ref 3.7–4.7)
BILIRUBIN TOTAL: 0.6 mg/dL (ref 0.0–1.2)
Bilirubin, Direct: 0.15 mg/dL (ref 0.00–0.40)
TOTAL PROTEIN: 6.9 g/dL (ref 6.0–8.5)

## 2018-05-03 LAB — LIPID PANEL
Chol/HDL Ratio: 3 ratio (ref 0.0–4.4)
Cholesterol, Total: 134 mg/dL (ref 100–199)
HDL: 45 mg/dL (ref >39)
LDL Calculated: 24 mg/dL (ref 0–99)
Triglycerides: 325 mg/dL — ABNORMAL HIGH (ref 0–149)
VLDL Cholesterol Cal: 65 mg/dL — ABNORMAL HIGH (ref 5–40)

## 2018-05-03 LAB — VITAMIN D 25 HYDROXY (VIT D DEFICIENCY, FRACTURES): Vit D, 25-Hydroxy: 30.3 ng/mL (ref 30.0–100.0)

## 2018-05-03 NOTE — Telephone Encounter (Signed)
Patient states she will address questions with DWM with him at her apt Tuesday.

## 2018-05-03 NOTE — Telephone Encounter (Signed)
PT states she is wanting to speak to [facility] before she sends in another medicine??? Wasn't really clear

## 2018-05-07 ENCOUNTER — Ambulatory Visit (INDEPENDENT_AMBULATORY_CARE_PROVIDER_SITE_OTHER): Payer: Medicare Other | Admitting: Family Medicine

## 2018-05-07 ENCOUNTER — Encounter: Payer: Self-pay | Admitting: Family Medicine

## 2018-05-07 ENCOUNTER — Ambulatory Visit (INDEPENDENT_AMBULATORY_CARE_PROVIDER_SITE_OTHER): Payer: Medicare Other

## 2018-05-07 VITALS — BP 142/80 | HR 59 | Temp 97.0°F | Ht 66.0 in | Wt 187.0 lb

## 2018-05-07 DIAGNOSIS — E559 Vitamin D deficiency, unspecified: Secondary | ICD-10-CM

## 2018-05-07 DIAGNOSIS — R030 Elevated blood-pressure reading, without diagnosis of hypertension: Secondary | ICD-10-CM | POA: Diagnosis not present

## 2018-05-07 DIAGNOSIS — E7800 Pure hypercholesterolemia, unspecified: Secondary | ICD-10-CM | POA: Diagnosis not present

## 2018-05-07 DIAGNOSIS — Z8249 Family history of ischemic heart disease and other diseases of the circulatory system: Secondary | ICD-10-CM

## 2018-05-07 DIAGNOSIS — M722 Plantar fascial fibromatosis: Secondary | ICD-10-CM

## 2018-05-07 NOTE — Progress Notes (Signed)
Subjective:    Patient ID: Carol [name], female    DOB: [birth year], 73 y.o.   MRN: [id number]  HPI Pt here for follow up and management of chronic medical problems which includes hyperlipidemia. She is taking medication regularly.  Patient has been doing well overall but has had problems with plantar fasciitis in both feet.  The left foot is better but she is still having problems with the right foot.  She has been seen by the podiatrist and has had injections.  She will make an appointment to go back and see him again for further treatment options.  She has not been able to walk and exercise as much and this certainly could account for the lab work which had very high triglycerides this time.  All of the other blood work was good including her LDL cholesterol.  She is on Repatha because of strong family history of heart disease.  Today she denies any chest pain pressure tightness or shortness of breath.  She denies any trouble with her stomach including nausea vomiting diarrhea problems swallowing blood in the stool or change in bowel habits.  Her last colonoscopy was in March 2013 and she was told that she would need another colonoscopy in 2023.  She is passing her water well.  She will get a chest x-ray today.  The recent lab work had normal liver function test.  The vitamin D level was at the low end of the normal range.  Triglycerides were elevated at 325.  The LDL-C was excellent at 24 and total cholesterol was good at 134.  The CBC was within normal limits blood sugar slightly elevated at 100.  The creatinine was normal and the sodium and chloride were slightly elevated with a normal potassium.  Patient is pleasant and doing well overall other than the plantar fasciitis of her right foot.   Patient Active Problem List   Diagnosis Date Noted  . Bilateral carotid artery stenosis 07/27/2017  . Murmur 07/26/2016  . Carotid stenosis 07/22/2014  . Family history of heart disease 07/28/2013  .  Need for prophylactic vaccination and inoculation against influenza 11/26/2012  . Diverticulosis   . Osteopenia 09/11/2012  . Hyperlipidemia LDL goal <70 07/23/2012  . H/O infectious disease 01/02/2012  . Pseudoptosis 11/07/2011   Outpatient Encounter Medications as of 05/07/2018  Medication Sig  . aspirin EC 81 MG tablet Take 81 mg by mouth daily.  . Calcium 500-125 MG-UNIT TABS 1 tablet. 1 tablet daily.  . Cholecalciferol (VITAMIN D3) 2000 units capsule 1 capsule.  . Evolocumab (REPATHA SURECLICK) 094 MG/ML SOAJ Inject 1 Dose into the skin every 14 (fourteen) days.  . Multiple Vitamins tablet 1 tablet. 1 tablet daily.  . rosuvastatin (CRESTOR) 5 MG tablet Take 2.5 mg by mouth daily.  . [DISCONTINUED] azithromycin (ZITHROMAX) 250 MG tablet Take 2 the first day and then one each day after.   No facility-administered encounter medications on file as of 05/07/2018.       Review of Systems  Constitutional: Negative.   HENT: Negative.   Eyes: Negative.   Respiratory: Negative.   Cardiovascular: Negative.   Gastrointestinal: Negative.   Endocrine: Negative.   Genitourinary: Negative.   Musculoskeletal: Positive for arthralgias (some right foot pain - seen Dr Irving Shows ).  Skin: Negative.   Allergic/Immunologic: Negative.   Neurological: Negative.   Hematological: Negative.   Psychiatric/Behavioral: Negative.        Objective:   Physical Exam Vitals signs and nursing note  reviewed.  Constitutional:      General: She is not in acute distress.    Appearance: Normal appearance. She is well-developed.  HENT:     Head: Normocephalic and atraumatic.     Right Ear: Tympanic membrane, ear canal and external ear normal. There is no impacted cerumen.     Left Ear: Ear canal and external ear normal. There is impacted cerumen.     Nose: Nose normal. No congestion.     Mouth/Throat:     Mouth: Mucous membranes are moist.     Pharynx: Oropharynx is clear. No oropharyngeal exudate or  posterior oropharyngeal erythema.  Eyes:     General: No scleral icterus.       Right eye: No discharge.        Left eye: No discharge.     Extraocular Movements: Extraocular movements intact.     Conjunctiva/sclera: Conjunctivae normal.     Pupils: Pupils are equal, round, and reactive to light.  Neck:     Musculoskeletal: Normal range of motion and neck supple.     Thyroid: No thyromegaly.     Vascular: No carotid bruit or JVD.     Comments: No bruits thyromegaly or anterior cervical adenopathy Cardiovascular:     Rate and Rhythm: Normal rate and regular rhythm.     Heart sounds: Normal heart sounds. No murmur.     Comments: The heart was regular today at 60/min and no murmurs were audible today.  There was no edema with good pedal pulses. Pulmonary:     Effort: Pulmonary effort is normal. No respiratory distress.     Breath sounds: Normal breath sounds. No wheezing or rales.  Abdominal:     General: Bowel sounds are normal.     Palpations: Abdomen is soft. There is no mass.     Tenderness: There is no abdominal tenderness. There is no guarding.     Comments: No liver or spleen enlargement no masses no bruits and no inguinal adenopathy  Musculoskeletal: Normal range of motion.        General: Tenderness present.     Right lower leg: No edema.     Left lower leg: No edema.     Comments: Tenderness plantar surface of right foot near heel.  Lymphadenopathy:     Cervical: No cervical adenopathy.  Skin:    General: Skin is warm and dry.     Findings: No rash.  Neurological:     General: No focal deficit present.     Mental Status: She is alert and oriented to person, place, and time. Mental status is at baseline.     Cranial Nerves: No cranial nerve deficit.     Gait: Gait normal.     Deep Tendon Reflexes: Reflexes are normal and symmetric. Reflexes normal.  Psychiatric:        Mood and Affect: Mood normal.        Behavior: Behavior normal.        Thought Content: Thought  content normal.        Judgment: Judgment normal.     Comments: Mood affect and behavior are all normal for this patient     BP (!) 147/66 (BP Location: Left Arm)   Pulse (!) 59   Temp (!) 97 F (36.1 C) (Oral)   Ht 5\' 6"  (1.676 m)   Wt 187 lb (84.8 kg)   BMI 30.18 kg/m        Assessment & Plan:  1. Pure  hypercholesterolemia -Glycerides were elevated this time.  This may be due to the lack of inactivity she has had because of the plantar fasciitis.  We will go ahead and start her on Vascepa and have her take 2 g twice daily in addition to the [place].  She will reduce her Crestor to 2-1/2 mg on Monday Wednesday and Friday. - DG Chest 2 View; Future  2. Vitamin D deficiency -Increase vitamin D3 intake 5000 units daily  3. Family history of heart disease -Continue with aggressive therapeutic lifestyle changes -Follow-up with cardiology as planned - DG Chest 2 View; Future  4. Elevated blood pressure reading -Patient's home reading today was good at 127/68.  The second reading done here was 142/80 in the right arm sitting with a large cuff manually.  No change in treatment.  Continue to watch sodium intake. - DG Chest 2 View; Future  5. Plantar fasciitis of right foot -Follow-up with podiatrist as planned  No orders of the defined types were placed in this encounter.  Patient Instructions                       Medicare Annual Wellness Visit  [facility] and the medical providers at [facility] strive to bring you the best medical care.  In doing so we not only want to address your current medical conditions and concerns but also to detect new conditions early and prevent illness, disease and health-related problems.    Medicare offers a yearly Wellness Visit which allows our clinical staff to assess your need for preventative services including immunizations, lifestyle education, counseling to decrease risk of preventable diseases and screening for  fall risk and other medical concerns.    This visit is provided free of charge (no copay) for all Medicare recipients. The clinical pharmacists at [facility] have begun to conduct these Wellness Visits which will also include a thorough review of all your medications.    As you primary medical provider recommend that you make an appointment for your Annual Wellness Visit if you have not done so already this year.  You may set up this appointment before you leave today or you may call back ([phone]) and schedule an appointment.  Please make sure when you call that you mention that you are scheduling your Annual Wellness Visit with the clinical pharmacist so that the appointment may be made for the proper length of time.     Continue current medications. Continue good therapeutic lifestyle changes which include good diet and exercise. Fall precautions discussed with patient. If an FOBT was given today- please return it to our front desk. If you are over 21 years old - you may need Prevnar 39 or the adult Pneumonia vaccine.  **Flu shots are available--- please call and schedule a FLU-CLINIC appointment**  After your visit with [place] today you will receive a survey in the mail or online from [employer] regarding your care with [place]. Please take a moment to fill this out. Your feedback is very important to [place] as you can help [place] better understand your patient needs as well as improve your experience and satisfaction. WE CARE ABOUT YOU!!!   Continue to follow-up with podiatrist regarding plantar fasciitis Try to resume exercise when foot is improved Follow-up with Dr. Debara Pickett as planned Start Vascepa 2 g twice daily Continue with Repatha May try reducing Crestor to 2-1/2 mg on Monday Wednesday and Friday only Drink plenty of  water and fluids and stay well-hydrated Practice good hand and respiratory hygiene Wash and cleanse hands regularly and avoid large crowds of people Use  Debrox to help soften earwax put 3 or 4 drops into the left ear canal daily for 3 nights and repeat this in 1 week.  Arrie Senate MD

## 2018-05-07 NOTE — Patient Instructions (Addendum)
Medicare Annual Wellness Visit  [facility] and the medical providers at [facility] strive to bring you the best medical care.  In doing so we not only want to address your current medical conditions and concerns but also to detect new conditions early and prevent illness, disease and health-related problems.    Medicare offers a yearly Wellness Visit which allows our clinical staff to assess your need for preventative services including immunizations, lifestyle education, counseling to decrease risk of preventable diseases and screening for fall risk and other medical concerns.    This visit is provided free of charge (no copay) for all Medicare recipients. The clinical pharmacists at [facility] have begun to conduct these Wellness Visits which will also include a thorough review of all your medications.    As you primary medical provider recommend that you make an appointment for your Annual Wellness Visit if you have not done so already this year.  You may set up this appointment before you leave today or you may call back ([phone]) and schedule an appointment.  Please make sure when you call that you mention that you are scheduling your Annual Wellness Visit with the clinical pharmacist so that the appointment may be made for the proper length of time.     Continue current medications. Continue good therapeutic lifestyle changes which include good diet and exercise. Fall precautions discussed with patient. If an FOBT was given today- please return it to our front desk. If you are over 39 years old - you may need Prevnar 8 or the adult Pneumonia vaccine.  **Flu shots are available--- please call and schedule a FLU-CLINIC appointment**  After your visit with [place] today you will receive a survey in the mail or online from [employer] regarding your care with [place]. Please take a moment to fill this out. Your feedback is very  important to [place] as you can help [place] better understand your patient needs as well as improve your experience and satisfaction. WE CARE ABOUT YOU!!!   Continue to follow-up with podiatrist regarding plantar fasciitis Try to resume exercise when foot is improved Follow-up with Dr. Debara Pickett as planned Start Vascepa 2 g twice daily Continue with Repatha May try reducing Crestor to 2-1/2 mg on Monday Wednesday and Friday only Drink plenty of water and fluids and stay well-hydrated Practice good hand and respiratory hygiene Wash and cleanse hands regularly and avoid large crowds of people Use Debrox to help soften earwax put 3 or 4 drops into the left ear canal daily for 3 nights and repeat this in 1 week.

## 2018-05-09 ENCOUNTER — Other Ambulatory Visit: Payer: Self-pay | Admitting: Family Medicine

## 2018-05-09 MED ORDER — ICOSAPENT ETHYL 1 G PO CAPS: 2.0000 g | ORAL_CAPSULE | Freq: Two times a day (BID) | ORAL | 5 refills | Status: DC

## 2018-05-10 DIAGNOSIS — M47816 Spondylosis without myelopathy or radiculopathy, lumbar region: Secondary | ICD-10-CM | POA: Diagnosis not present

## 2018-05-10 DIAGNOSIS — S338XXA Sprain of other parts of lumbar spine and pelvis, initial encounter: Secondary | ICD-10-CM | POA: Diagnosis not present

## 2018-05-10 DIAGNOSIS — M9903 Segmental and somatic dysfunction of lumbar region: Secondary | ICD-10-CM | POA: Diagnosis not present

## 2018-05-13 DIAGNOSIS — M47816 Spondylosis without myelopathy or radiculopathy, lumbar region: Secondary | ICD-10-CM | POA: Diagnosis not present

## 2018-05-13 DIAGNOSIS — M9903 Segmental and somatic dysfunction of lumbar region: Secondary | ICD-10-CM | POA: Diagnosis not present

## 2018-05-13 DIAGNOSIS — S338XXA Sprain of other parts of lumbar spine and pelvis, initial encounter: Secondary | ICD-10-CM | POA: Diagnosis not present

## 2018-05-14 DIAGNOSIS — M47816 Spondylosis without myelopathy or radiculopathy, lumbar region: Secondary | ICD-10-CM | POA: Diagnosis not present

## 2018-05-14 DIAGNOSIS — S338XXA Sprain of other parts of lumbar spine and pelvis, initial encounter: Secondary | ICD-10-CM | POA: Diagnosis not present

## 2018-05-14 DIAGNOSIS — M9903 Segmental and somatic dysfunction of lumbar region: Secondary | ICD-10-CM | POA: Diagnosis not present

## 2018-05-16 ENCOUNTER — Telehealth: Payer: Self-pay | Admitting: *Deleted

## 2018-05-16 DIAGNOSIS — S338XXA Sprain of other parts of lumbar spine and pelvis, initial encounter: Secondary | ICD-10-CM | POA: Diagnosis not present

## 2018-05-16 DIAGNOSIS — M47816 Spondylosis without myelopathy or radiculopathy, lumbar region: Secondary | ICD-10-CM | POA: Diagnosis not present

## 2018-05-16 DIAGNOSIS — M9903 Segmental and somatic dysfunction of lumbar region: Secondary | ICD-10-CM | POA: Diagnosis not present

## 2018-05-16 NOTE — Telephone Encounter (Addendum)
   Primary Cardiologist:  Dr Debara Pickett  DR HILTY  LIPID  Patient contacted.  History reviewed.  No symptoms to suggest any unstable cardiac conditions.  Based on discussion, with current pandemic situation, we will be postponing this appointment for Ms [place].  If symptoms change, she has been instructed to contact our office.  Routing to C19 CANCEL pool for tracking (P CV DIV CV19 CANCEL).  Raiford Simmonds, RN  05/16/2018 10:26 AM         .

## 2018-05-21 ENCOUNTER — Ambulatory Visit: Payer: 59 | Admitting: Internal Medicine

## 2018-05-28 ENCOUNTER — Telehealth: Payer: Self-pay | Admitting: Family Medicine

## 2018-05-29 MED ORDER — OMEGA-3-ACID ETHYL ESTERS 1 G PO CAPS: 2.0000 g | ORAL_CAPSULE | Freq: Two times a day (BID) | ORAL | 1 refills | Status: DC

## 2018-05-29 NOTE — Telephone Encounter (Signed)
Pt called and aware that insurance did not deny the med - its just a high tier for her insurance.  We will do lovaza now and re-try the vascepa in a few months.

## 2018-05-29 NOTE — Telephone Encounter (Signed)
Please see if we can get approval for this for her as she does have hyperlipidemia and elevated triglycerides.  If we cannot get approval she can take Lovaza 2 twice daily.  I would prefer vascepa

## 2018-05-31 ENCOUNTER — Encounter: Payer: Self-pay | Admitting: Family Medicine

## 2018-05-31 ENCOUNTER — Other Ambulatory Visit: Payer: Self-pay

## 2018-05-31 ENCOUNTER — Ambulatory Visit (INDEPENDENT_AMBULATORY_CARE_PROVIDER_SITE_OTHER): Payer: 59 | Admitting: Family Medicine

## 2018-05-31 VITALS — BP 135/64 | HR 68 | Temp 97.3°F | Ht 66.0 in | Wt 187.0 lb

## 2018-05-31 DIAGNOSIS — D225 Melanocytic nevi of trunk: Secondary | ICD-10-CM | POA: Diagnosis not present

## 2018-05-31 DIAGNOSIS — L919 Hypertrophic disorder of the skin, unspecified: Secondary | ICD-10-CM | POA: Diagnosis not present

## 2018-05-31 NOTE — Patient Instructions (Signed)
Keep area cleansed and with dressing for the next 2 to 3 days May use Polysporin ointment if needed. Continue to use respiratory and hand cleansing on a regular basis for the next few weeks.  Stay out of crowds of people and wear facemask when going to the grocery store pharmacy or any other public area that is permitted.   Use hand sanitizer's regularly in addition to handwashing.

## 2018-05-31 NOTE — Addendum Note (Signed)
Addended by: Zannie Cove on: 05/31/2018 10:17 AM   Modules accepted: Orders

## 2018-05-31 NOTE — Progress Notes (Signed)
   Subjective:    Patient ID: Carol [name], female    DOB: 03-May-1945, 73 y.o.   MRN: [id number]  HPI Patient here today for a irritated mole on her bra line.  D partially amputated nevus is just below the bra line on the right.   Patient Active Problem List   Diagnosis Date Noted  . Bilateral carotid artery stenosis 07/27/2017  . Murmur 07/26/2016  . Carotid stenosis 07/22/2014  . Family history of heart disease 07/28/2013  . Need for prophylactic vaccination and inoculation against influenza 11/26/2012  . Diverticulosis   . Osteopenia 09/11/2012  . Hyperlipidemia LDL goal <70 07/23/2012  . H/O infectious disease 01/02/2012  . Pseudoptosis 11/07/2011   Outpatient Encounter Medications as of 05/31/2018  Medication Sig  . aspirin EC 81 MG tablet Take 81 mg by mouth daily.  . Calcium 500-125 MG-UNIT TABS 1 tablet. 1 tablet daily.  . Cholecalciferol (VITAMIN D3) 2000 units capsule 1 capsule.  . Evolocumab (REPATHA SURECLICK) 354 MG/ML SOAJ Inject 1 Dose into the skin every 14 (fourteen) days.  . Multiple Vitamins tablet 1 tablet. 1 tablet daily.  Marland Kitchen omega-3 acid ethyl esters (LOVAZA) 1 g capsule Take 2 capsules (2 g total) by mouth 2 (two) times daily.  . rosuvastatin (CRESTOR) 5 MG tablet Take 2.5 mg by mouth daily.   No facility-administered encounter medications on file as of 05/31/2018.       Review of Systems  Constitutional: Negative.   HENT: Negative.   Eyes: Negative.   Respiratory: Negative.   Cardiovascular: Negative.   Gastrointestinal: Negative.   Endocrine: Negative.   Genitourinary: Negative.   Musculoskeletal: Negative.   Skin: Negative.        Irritated mole - bra line  Allergic/Immunologic: Negative.   Neurological: Negative.   Hematological: Negative.   Psychiatric/Behavioral: Negative.   All other systems reviewed and are negative.      Objective:   Physical Exam BP (!) 149/66 (BP Location: Left Arm)   Pulse 68   Temp (!) 97.3 F (36.3 C)  (Oral)   Ht 5\' 6"  (1.676 m)   Wt 187 lb (84.8 kg)   BMI 30.18 kg/m   The irritated nevus below the bra line on the right was excised and the base was hyfrecated and also Monsel solution was used to help with hyfrecation.      Assessment & Plan:  1. Irritated nevus of chest -Excision of irritated nevus -For pathology  Patient Instructions  Keep area cleansed and with dressing for the next 2 to 3 days May use Polysporin ointment if needed. Continue to use respiratory and hand cleansing on a regular basis for the next few weeks.  Stay out of crowds of people and wear facemask when going to the grocery store pharmacy or any other public area that is permitted.   Use hand sanitizer's regularly in addition to handwashing.  Arrie Senate MD

## 2018-06-03 LAB — PATHOLOGY

## 2018-07-04 ENCOUNTER — Ambulatory Visit (INDEPENDENT_AMBULATORY_CARE_PROVIDER_SITE_OTHER): Payer: Medicare Other | Admitting: Podiatry

## 2018-07-04 ENCOUNTER — Encounter: Payer: Self-pay | Admitting: Podiatry

## 2018-07-04 ENCOUNTER — Other Ambulatory Visit: Payer: Self-pay

## 2018-07-04 ENCOUNTER — Ambulatory Visit (INDEPENDENT_AMBULATORY_CARE_PROVIDER_SITE_OTHER): Payer: Medicare Other

## 2018-07-04 VITALS — BP 170/74 | HR 63 | Temp 98.1°F | Resp 16

## 2018-07-04 DIAGNOSIS — M722 Plantar fascial fibromatosis: Secondary | ICD-10-CM

## 2018-07-04 MED ORDER — MELOXICAM 15 MG PO TABS: 15.0000 mg | ORAL_TABLET | Freq: Every day | ORAL | 3 refills | Status: DC

## 2018-07-04 MED ORDER — METHYLPREDNISOLONE 4 MG PO TBPK: ORAL_TABLET | ORAL | 0 refills | Status: DC

## 2018-07-04 NOTE — Patient Instructions (Signed)

## 2018-07-06 ENCOUNTER — Encounter: Payer: Self-pay | Admitting: Podiatry

## 2018-07-06 NOTE — Progress Notes (Signed)
Subjective:  Patient ID: Carol [name], female    DOB: [birth year],  MRN: [id number] HPI Chief Complaint  Patient presents with  . Foot Pain    Plantar heel right - burning x 5 months, AM pain, active lifestyle, seen podiatrist-gave 3 shots (no help), tried rest, ice and night boot-still hurts"  . New Patient (Initial Visit)    73 y.o. female presents with the above complaint.   ROS: Denies fever chills nausea vomiting muscle aches pains calf pain back pain chest pain shortness of breath.  Relates that she had seen another physician for this same problem in the past has had multiple injections brought a paper with her today that showed that the injections were made up of 5 mg of triamcinolone and 2 mg of dexamethasone with local anesthetic.  She had 3 of these.  Past Medical History:  Diagnosis Date  . Carotid artery stenosis   . Cataract   . Diverticulosis   . Fractured hand    right  . Hyperlipidemia   . Osteopenia   . Precancerous skin lesion 2016   Past Surgical History:  Procedure Laterality Date  . CATARACT EXTRACTION W/ INTRAOCULAR LENS IMPLANT Bilateral   . CHOLECYSTECTOMY  2009  . EYE SURGERY      Current Outpatient Medications:  .  aspirin EC 81 MG tablet, Take 81 mg by mouth daily., Disp: , Rfl:  .  Calcium 500-125 MG-UNIT TABS, 1 tablet. 1 tablet daily., Disp: , Rfl:  .  Cholecalciferol (VITAMIN D3) 2000 units capsule, 1 capsule., Disp: , Rfl:  .  Evolocumab (REPATHA SURECLICK) 782 MG/ML SOAJ, Inject 1 Dose into the skin every 14 (fourteen) days., Disp: 6 pen, Rfl: 3 .  meloxicam (MOBIC) 15 MG tablet, Take 1 tablet (15 mg total) by mouth daily., Disp: 30 tablet, Rfl: 3 .  methylPREDNISolone (MEDROL DOSEPAK) 4 MG TBPK tablet, 6 day dose pack - take as directed, Disp: 21 tablet, Rfl: 0 .  Multiple Vitamins tablet, 1 tablet. 1 tablet daily., Disp: , Rfl:  .  omega-3 acid ethyl esters (LOVAZA) 1 g capsule, Take 2 capsules (2 g total) by mouth 2 (two) times daily.,  Disp: 360 capsule, Rfl: 1 .  rosuvastatin (CRESTOR) 5 MG tablet, Take 2.5 mg by mouth daily., Disp: , Rfl:   Allergies  Allergen Reactions  . Penicillins Hives  . Sulfa Antibiotics   . Sulfamethoxazole Itching  . Lipitor [Atorvastatin] Diarrhea  . Crestor [Rosuvastatin Calcium] Other (See Comments)    Myalgias    Review of Systems Objective:   Vitals:   07/04/18 1359  BP: (!) 170/74  Pulse: 63  Resp: 16  Temp: 98.1 F (36.7 C)    General: Well developed, nourished, in no acute distress, alert and oriented x3   Dermatological: Skin is warm, dry and supple bilateral. Nails x 10 are well maintained; remaining integument appears unremarkable at this time. There are no open sores, no preulcerative lesions, no rash or signs of infection present.  Vascular: Dorsalis Pedis artery and Posterior Tibial artery pedal pulses are 2/4 bilateral with immedate capillary fill time. Pedal hair growth present. No varicosities and no lower extremity edema present bilateral.   Neruologic: Grossly intact via light touch bilateral. Vibratory intact via tuning fork bilateral. Protective threshold with Semmes Wienstein monofilament intact to all pedal sites bilateral. Patellar and Achilles deep tendon reflexes 2+ bilateral. No Babinski or clonus noted bilateral.   Musculoskeletal: No gross boney pedal deformities bilateral. No pain, crepitus, or limitation  noted with foot and ankle range of motion bilateral. Muscular strength 5/5 in all groups tested bilateral.  Gait: Unassisted, Nonantalgic.    Radiographs:  3 views radiographs taken today right foot demonstrates osseously mature individual plantar distally oriented calcaneal heel spur soft tissue increase in density plantar fashion calcaneal insertion site indicative of plantar fasciitis no acute findings no fractures identified.   Assessment & Plan:   Assessment: Chronic intractable plantar fasciitis of the right heel most likely is just a  chronic inflammation.    Plan: Discussed etiology pathology conservative or surgical therapies at this point time performed injection to the right heel with 20 mg Kenalog 5 mg Marcaine point maximal tenderness after sterile Betadine skin prep.  Tolerated procedure well without complication start her on a Medrol Dosepak to be followed by meloxicam.  Placed in a plantar fascial brace as well as night splint.  Discussed appropriate shoe gear stretching exercises ice therapy sugar modifications.  I will follow-up with her in 1 month she will call with questions or concerns or any side effects of medication.     Max T. [place], [place]

## 2018-08-06 ENCOUNTER — Other Ambulatory Visit (HOSPITAL_COMMUNITY): Payer: Self-pay | Admitting: Internal Medicine

## 2018-08-06 ENCOUNTER — Ambulatory Visit (INDEPENDENT_AMBULATORY_CARE_PROVIDER_SITE_OTHER): Payer: Medicare Other | Admitting: Podiatry

## 2018-08-06 ENCOUNTER — Ambulatory Visit (HOSPITAL_COMMUNITY)
Admission: RE | Admit: 2018-08-06 | Discharge: 2018-08-06 | Disposition: A | Discharge status: Home / Self Care | Payer: Medicare Other | Source: Ambulatory Visit | Attending: Internal Medicine | Admitting: Internal Medicine

## 2018-08-06 ENCOUNTER — Other Ambulatory Visit: Payer: Self-pay

## 2018-08-06 ENCOUNTER — Encounter: Payer: Self-pay | Admitting: Podiatry

## 2018-08-06 VITALS — Temp 97.9°F

## 2018-08-06 DIAGNOSIS — M722 Plantar fascial fibromatosis: Secondary | ICD-10-CM

## 2018-08-06 DIAGNOSIS — I6523 Occlusion and stenosis of bilateral carotid arteries: Secondary | ICD-10-CM

## 2018-08-06 NOTE — Progress Notes (Signed)
She presents today for follow-up of her right heel states that is doing so much better.  She states that is better than 90 to 95% improvement at this point.  She states that she continues to take her One-A-Day medication she also continues her night splint and her plantar fascial brace and tennis shoes at all times other than icing.  Objective: Vital signs are stable she alert and oriented x3 no reproducible pain on palpation of the right foot pulses remain palpable.  No open lesions or wounds.  Assessment: Well-healing plantar fasciitis right.  Plan: Discussed etiology pathology and surgical therapies at this point time with him and recommend that she continue all of her conservative therapies I will follow-up with her in 1 week if she continues to progressively improve that she will cancel that appointment.  Should she regress she will notify us immediately

## 2018-08-08 ENCOUNTER — Other Ambulatory Visit: Payer: Self-pay | Admitting: *Deleted

## 2018-08-08 DIAGNOSIS — I6523 Occlusion and stenosis of bilateral carotid arteries: Secondary | ICD-10-CM

## 2018-08-29 ENCOUNTER — Other Ambulatory Visit: Payer: Self-pay

## 2018-08-29 ENCOUNTER — Ambulatory Visit (INDEPENDENT_AMBULATORY_CARE_PROVIDER_SITE_OTHER): Payer: Medicare Other | Admitting: Podiatry

## 2018-08-29 ENCOUNTER — Encounter: Payer: Self-pay | Admitting: Podiatry

## 2018-08-29 DIAGNOSIS — M722 Plantar fascial fibromatosis: Secondary | ICD-10-CM | POA: Diagnosis not present

## 2018-08-29 NOTE — Progress Notes (Signed)
  Subjective:  Patient ID: Carol [name], female    DOB: 08/24/1945,  MRN: [id number] HPI Chief Complaint  Patient presents with  . Plantar Fasciitis    Follow up right heel   "Its still a little sore right in the middle, but better"    73 y.o. female presents with the above complaint.   ROS: Denies fever chills nausea vomiting muscle aches pains calf pain back pain chest pain shortness of breath  Past Medical History:  Diagnosis Date  . Carotid artery stenosis   . Cataract   . Diverticulosis   . Fractured hand    right  . Hyperlipidemia   . Osteopenia   . Precancerous skin lesion 2016   Past Surgical History:  Procedure Laterality Date  . CATARACT EXTRACTION W/ INTRAOCULAR LENS IMPLANT Bilateral   . CHOLECYSTECTOMY  2009  . EYE SURGERY      Current Outpatient Medications:  .  aspirin EC 81 MG tablet, Take 81 mg by mouth daily., Disp: , Rfl:  .  Calcium 500-125 MG-UNIT TABS, 1 tablet. 1 tablet daily., Disp: , Rfl:  .  Cholecalciferol (VITAMIN D3) 2000 units capsule, 1 capsule., Disp: , Rfl:  .  Evolocumab (REPATHA SURECLICK) 166 MG/ML SOAJ, Inject 1 Dose into the skin every 14 (fourteen) days., Disp: 6 pen, Rfl: 3 .  meloxicam (MOBIC) 15 MG tablet, Take 1 tablet (15 mg total) by mouth daily., Disp: 30 tablet, Rfl: 3 .  Multiple Vitamins tablet, 1 tablet. 1 tablet daily., Disp: , Rfl:  .  omega-3 acid ethyl esters (LOVAZA) 1 g capsule, Take 2 capsules (2 g total) by mouth 2 (two) times daily., Disp: 360 capsule, Rfl: 1 .  rosuvastatin (CRESTOR) 5 MG tablet, Take 2.5 mg by mouth daily., Disp: , Rfl:   Allergies  Allergen Reactions  . Penicillins Hives  . Sulfa Antibiotics   . Sulfamethoxazole Itching  . Lipitor [Atorvastatin] Diarrhea  . Crestor [Rosuvastatin Calcium] Other (See Comments)    Myalgias    Review of Systems Objective:  There were no vitals filed for this visit.  General: Well developed, nourished, in no acute distress, alert and oriented x3    Dermatological: Skin is warm, dry and supple bilateral. Nails x 10 are well maintained; remaining integument appears unremarkable at this time. There are no open sores, no preulcerative lesions, no rash or signs of infection present.  Vascular: Dorsalis Pedis artery and Posterior Tibial artery pedal pulses are 2/4 bilateral with immedate capillary fill time. Pedal hair growth present. No varicosities and no lower extremity edema present bilateral.   Neruologic: Grossly intact via light touch bilateral. Vibratory intact via tuning fork bilateral. Protective threshold with Semmes Wienstein monofilament intact to all pedal sites bilateral. Patellar and Achilles deep tendon reflexes 2+ bilateral. No Babinski or clonus noted bilateral.   Musculoskeletal: No gross boney pedal deformities bilateral. No pain, crepitus, or limitation noted with foot and ankle range of motion bilateral. Muscular strength 5/5 in all groups tested bilateral.  Pain on palpation central band of the plantar fascia.  Gait: Unassisted, Nonantalgic.    Radiographs:  None taken today  Assessment & Plan:   Assessment: Recurrent plantar fasciitis.  Plan: Plantar fasciitis right foot I reinjected third dose of cortisone today     Saharsh Sterling T. [place], [place]

## 2018-09-13 ENCOUNTER — Encounter: Payer: Self-pay | Admitting: *Deleted

## 2018-09-20 ENCOUNTER — Other Ambulatory Visit: Payer: Self-pay | Admitting: Family Medicine

## 2018-09-23 DIAGNOSIS — H524 Presbyopia: Secondary | ICD-10-CM | POA: Diagnosis not present

## 2018-09-23 DIAGNOSIS — H04123 Dry eye syndrome of bilateral lacrimal glands: Secondary | ICD-10-CM | POA: Diagnosis not present

## 2018-09-23 DIAGNOSIS — H26493 Other secondary cataract, bilateral: Secondary | ICD-10-CM | POA: Diagnosis not present

## 2018-09-26 ENCOUNTER — Ambulatory Visit: Payer: 59 | Admitting: Internal Medicine

## 2018-10-01 ENCOUNTER — Encounter: Payer: Self-pay | Admitting: Family Medicine

## 2018-10-01 ENCOUNTER — Ambulatory Visit (INDEPENDENT_AMBULATORY_CARE_PROVIDER_SITE_OTHER): Payer: Medicare Other | Admitting: Family Medicine

## 2018-10-01 ENCOUNTER — Other Ambulatory Visit: Payer: Self-pay

## 2018-10-01 DIAGNOSIS — R6889 Other general symptoms and signs: Secondary | ICD-10-CM

## 2018-10-01 DIAGNOSIS — R197 Diarrhea, unspecified: Secondary | ICD-10-CM | POA: Diagnosis not present

## 2018-10-01 DIAGNOSIS — R509 Fever, unspecified: Secondary | ICD-10-CM | POA: Diagnosis not present

## 2018-10-01 DIAGNOSIS — Z20822 Contact with and (suspected) exposure to covid-19: Secondary | ICD-10-CM

## 2018-10-01 NOTE — Progress Notes (Signed)
Virtual Visit via telephone Note Due to COVID-19 pandemic this visit was conducted virtually. This visit type was conducted due to national recommendations for restrictions regarding the COVID-19 Pandemic (e.g. social distancing, sheltering in place) in an effort to limit this patient's exposure and mitigate transmission in our community. All issues noted in this document were discussed and addressed.  A physical exam was not performed with this format.   I connected with Carol [name] on 10/01/18 at 1520 by telephone and verified that I am speaking with the correct person using two identifiers. Carol [name] is currently located at home and no one is currently with them during visit. The provider, Monia Pouch, FNP is located in their office at time of visit.  I discussed the limitations, risks, security and privacy concerns of performing an evaluation and management service by telephone and the availability of in person appointments. I also discussed with the patient that there may be a patient responsible charge related to this service. The patient expressed understanding and agreed to proceed.  Subjective:  Patient ID: Carol [name], female    DOB: 23-May-1945, 73 y.o.   MRN: [id number]  Chief Complaint:  Diarrhea and Fever   HPI: Carol [name] is a 73 y.o. female presenting on 10/01/2018 for Diarrhea and Fever   Pt reports two days of fever and diarrhea.   Diarrhea  This is a new problem. The current episode started in the past 7 days. The problem occurs 2 to 4 times per day. The problem has been waxing and waning. The stool consistency is described as watery. The patient states that diarrhea does not awaken her from sleep. Associated symptoms include a fever. Pertinent negatives include no abdominal pain, arthralgias, bloating, chills, coughing, headaches, increased  flatus, myalgias, sweats, URI, vomiting or weight loss. Nothing aggravates the symptoms. There are no known risk  factors. She has tried analgesics for the symptoms. The treatment provided no relief.  Fever  This is a new problem. The current episode started in the past 7 days. The problem occurs intermittently. The problem has been waxing and waning. The temperature was taken using an oral thermometer. Associated symptoms include diarrhea. Pertinent negatives include no abdominal pain, chest pain, congestion, coughing, ear pain, headaches, muscle aches, nausea, rash, sleepiness, sore throat, urinary pain, vomiting or wheezing. She has tried acetaminophen for the symptoms. The treatment provided moderate relief.     Relevant past medical, surgical, family, and social history reviewed and updated as indicated.  Allergies and medications reviewed and updated.   Past Medical History:  Diagnosis Date  . Carotid artery stenosis   . Cataract   . Diverticulosis   . Fractured hand    right  . Hyperlipidemia   . Osteopenia   . Precancerous skin lesion 2016    Past Surgical History:  Procedure Laterality Date  . CATARACT EXTRACTION W/ INTRAOCULAR LENS IMPLANT Bilateral   . CHOLECYSTECTOMY  2009  . EYE SURGERY      Social History   Socioeconomic History  . Marital status: Widowed    Spouse name: Not on file  . Number of children: Not on file  . Years of education: Not on file  . Highest education level: Not on file  Occupational History  . Not on file  Social Needs  . Financial resource strain: Not on file  . Food insecurity    Worry: Not on file    Inability: Not on file  . Transportation needs  Medical: Not on file    Non-medical: Not on file  Tobacco Use  . Smoking status: Never Smoker  . Smokeless tobacco: Never Used  Substance and Sexual Activity  . Alcohol use: No  . Drug use: No  . Sexual activity: Not on file  Lifestyle  . Physical activity    Days per week: Not on file    Minutes per session: Not on file  . Stress: Not on file  Relationships  . Social Product manager on phone: Not on file    Gets together: Not on file    Attends religious service: Not on file    Active member of club or organization: Not on file    Attends meetings of clubs or organizations: Not on file    Relationship status: Not on file  . Intimate partner violence    Fear of current or ex partner: Not on file    Emotionally abused: Not on file    Physically abused: Not on file    Forced sexual activity: Not on file  Other Topics Concern  . Not on file  Social History Narrative  . Not on file    Outpatient Encounter Medications as of 10/01/2018  Medication Sig  . aspirin EC 81 MG tablet Take 81 mg by mouth daily.  . Calcium 500-125 MG-UNIT TABS 1 tablet. 1 tablet daily.  . Cholecalciferol (VITAMIN D3) 2000 units capsule 1 capsule.  . Evolocumab (REPATHA SURECLICK) 322 MG/ML SOAJ Inject 1 Dose into the skin every 14 (fourteen) days.  . meloxicam (MOBIC) 15 MG tablet Take 1 tablet (15 mg total) by mouth daily.  . Multiple Vitamins tablet 1 tablet. 1 tablet daily.  Marland Kitchen omega-3 acid ethyl esters (LOVAZA) 1 g capsule Take 2 capsules (2 g total) by mouth 2 (two) times daily.  . rosuvastatin (CRESTOR) 5 MG tablet TAKE 1/2 (ONE-HALF) TABLET BY MOUTH AT BEDTIME   No facility-administered encounter medications on file as of 10/01/2018.     Allergies  Allergen Reactions  . Penicillins Hives  . Sulfa Antibiotics   . Sulfamethoxazole Itching  . Lipitor [Atorvastatin] Diarrhea  . Crestor [Rosuvastatin Calcium] Other (See Comments)    Myalgias     Review of Systems  Constitutional: Positive for fever. Negative for activity change, appetite change, chills, diaphoresis, fatigue, unexpected weight change and weight loss.  HENT: Negative for congestion, ear pain and sore throat.   Respiratory: Negative for cough, chest tightness, shortness of breath and wheezing.   Cardiovascular: Negative for chest pain, palpitations and leg swelling.  Gastrointestinal: Positive for diarrhea.  Negative for abdominal distention, abdominal pain, anal bleeding, bloating, blood in stool, constipation, flatus, nausea, rectal pain and vomiting.  Genitourinary: Negative for decreased urine volume, difficulty urinating and dysuria.  Musculoskeletal: Negative for arthralgias and myalgias.  Skin: Negative for rash.  Neurological: Negative for dizziness, syncope, weakness, light-headedness and headaches.  Psychiatric/Behavioral: Negative for confusion.  All other systems reviewed and are negative.        Observations/Objective: No vital signs or physical exam, this was a telephone or virtual health encounter.  Pt alert and oriented, answers all questions appropriately, and able to speak in full sentences.    Assessment and Plan: Carol [name] was seen today for diarrhea and fever.  Diagnoses and all orders for this visit:  Diarrhea in adult patient Fever and chills Suspected Covid-19 Virus Infection Reported symptoms concerning for COVID-19 infection. Pt denies known sick exposures. She has low grade fever that  is controlled with tylenol and diarrhea. Symptomatic care and self quarantine discussed in detail. BRAT diet, adequate hydration, tylenol for fever control, and rest. Pt aware to report any persistent, new, or worsening symptoms. COVID-19 testing ordered and pt aware of testing sites and hours of operation.    Follow Up Instructions: Return if symptoms worsen or fail to improve.    I discussed the assessment and treatment plan with the patient. The patient was provided an opportunity to ask questions and all were answered. The patient agreed with the plan and demonstrated an understanding of the instructions.   The patient was advised to call back or seek an in-person evaluation if the symptoms worsen or if the condition fails to improve as anticipated.  The above assessment and management plan was discussed with the patient. The patient verbalized understanding of and has agreed  to the management plan. Patient is aware to call the clinic if symptoms persist or worsen. Patient is aware when to return to the clinic for a follow-up visit. Patient educated on when it is appropriate to go to the emergency department.    I provided 15 minutes of non-face-to-face time during this encounter. The call started at 1520. The call ended at 1535. The other time was used for coordination of care.    Monia Pouch, FNP-C [facility] [address] [place], [place] [address] [phone] 10/01/18

## 2018-10-02 ENCOUNTER — Telehealth: Payer: Self-pay

## 2018-10-02 DIAGNOSIS — Z20822 Contact with and (suspected) exposure to covid-19: Secondary | ICD-10-CM

## 2018-10-02 NOTE — Telephone Encounter (Signed)
lab

## 2018-10-03 LAB — NOVEL CORONAVIRUS, NAA: SARS-CoV-2, NAA: NOT DETECTED

## 2018-10-17 DIAGNOSIS — Z0120 Encounter for dental examination and cleaning without abnormal findings: Secondary | ICD-10-CM | POA: Diagnosis not present

## 2018-11-07 ENCOUNTER — Other Ambulatory Visit: Payer: Medicare Other

## 2018-11-07 ENCOUNTER — Other Ambulatory Visit: Payer: Self-pay

## 2018-11-07 DIAGNOSIS — E7800 Pure hypercholesterolemia, unspecified: Secondary | ICD-10-CM

## 2018-11-07 DIAGNOSIS — E559 Vitamin D deficiency, unspecified: Secondary | ICD-10-CM | POA: Diagnosis not present

## 2018-11-07 DIAGNOSIS — Z8249 Family history of ischemic heart disease and other diseases of the circulatory system: Secondary | ICD-10-CM | POA: Diagnosis not present

## 2018-11-08 LAB — LIPID PANEL
Chol/HDL Ratio: 2.3 ratio (ref 0.0–4.4)
Cholesterol, Total: 114 mg/dL (ref 100–199)
HDL: 50 mg/dL (ref >39)
LDL Chol Calc (NIH): 31 mg/dL (ref 0–99)
Triglycerides: 213 mg/dL — ABNORMAL HIGH (ref 0–149)
VLDL Cholesterol Cal: 33 mg/dL (ref 5–40)

## 2018-11-08 LAB — CBC WITH DIFFERENTIAL/PLATELET
Basophils Absolute: 0.1 10*3/uL (ref 0.0–0.2)
Basos: 1 %
EOS (ABSOLUTE): 0.2 10*3/uL (ref 0.0–0.4)
Eos: 3 %
Hematocrit: 42 % (ref 34.0–46.6)
Hemoglobin: 13.9 g/dL (ref 11.1–15.9)
Immature Grans (Abs): 0 10*3/uL (ref 0.0–0.1)
Immature Granulocytes: 0 %
Lymphocytes Absolute: 2.5 10*3/uL (ref 0.7–3.1)
Lymphs: 31 %
MCH: 30.2 pg (ref 26.6–33.0)
MCHC: 33.1 g/dL (ref 31.5–35.7)
MCV: 91 fL (ref 79–97)
Monocytes Absolute: 0.5 10*3/uL (ref 0.1–0.9)
Monocytes: 6 %
Neutrophils Absolute: 4.8 10*3/uL (ref 1.4–7.0)
Neutrophils: 59 %
Platelets: 226 10*3/uL (ref 150–450)
RBC: 4.6 x10E6/uL (ref 3.77–5.28)
RDW: 13 % (ref 11.7–15.4)
WBC: 8.1 10*3/uL (ref 3.4–10.8)

## 2018-11-08 LAB — CMP14+EGFR
ALT: 12 IU/L (ref 0–32)
AST: 16 IU/L (ref 0–40)
Albumin/Globulin Ratio: 1.8 (ref 1.2–2.2)
Albumin: 4.4 g/dL (ref 3.7–4.7)
Alkaline Phosphatase: 60 IU/L (ref 39–117)
BUN/Creatinine Ratio: 18 (ref 12–28)
BUN: 16 mg/dL (ref 8–27)
Bilirubin Total: 0.6 mg/dL (ref 0.0–1.2)
CO2: 27 mmol/L (ref 20–29)
Calcium: 9.3 mg/dL (ref 8.7–10.3)
Chloride: 106 mmol/L (ref 96–106)
Creatinine, Ser: 0.91 mg/dL (ref 0.57–1.00)
GFR calc Af Amer: 71 mL/min/{1.73_m2} (ref >59)
GFR calc non Af Amer: 62 mL/min/{1.73_m2} (ref >59)
Globulin, Total: 2.4 g/dL (ref 1.5–4.5)
Glucose: 96 mg/dL (ref 65–99)
Potassium: 4.8 mmol/L (ref 3.5–5.2)
Sodium: 144 mmol/L (ref 134–144)
Total Protein: 6.8 g/dL (ref 6.0–8.5)

## 2018-11-08 LAB — VITAMIN D 25 HYDROXY (VIT D DEFICIENCY, FRACTURES): Vit D, 25-Hydroxy: 44.5 ng/mL (ref 30.0–100.0)

## 2018-11-12 ENCOUNTER — Other Ambulatory Visit: Payer: Self-pay

## 2018-11-13 ENCOUNTER — Ambulatory Visit: Payer: Medicare Other | Admitting: Family Medicine

## 2018-11-13 ENCOUNTER — Ambulatory Visit (INDEPENDENT_AMBULATORY_CARE_PROVIDER_SITE_OTHER): Payer: Medicare Other | Admitting: Family Medicine

## 2018-11-13 ENCOUNTER — Encounter: Payer: Self-pay | Admitting: Family Medicine

## 2018-11-13 VITALS — BP 139/57 | HR 58 | Temp 98.0°F | Resp 20 | Ht 66.0 in | Wt 182.0 lb

## 2018-11-13 DIAGNOSIS — M722 Plantar fascial fibromatosis: Secondary | ICD-10-CM

## 2018-11-13 DIAGNOSIS — M8589 Other specified disorders of bone density and structure, multiple sites: Secondary | ICD-10-CM | POA: Diagnosis not present

## 2018-11-13 DIAGNOSIS — Z13820 Encounter for screening for osteoporosis: Secondary | ICD-10-CM | POA: Diagnosis not present

## 2018-11-13 DIAGNOSIS — H0236 Blepharochalasis left eye, unspecified eyelid: Secondary | ICD-10-CM | POA: Diagnosis not present

## 2018-11-13 DIAGNOSIS — E785 Hyperlipidemia, unspecified: Secondary | ICD-10-CM | POA: Diagnosis not present

## 2018-11-13 DIAGNOSIS — R011 Cardiac murmur, unspecified: Secondary | ICD-10-CM

## 2018-11-13 DIAGNOSIS — Z780 Asymptomatic menopausal state: Secondary | ICD-10-CM

## 2018-11-13 MED ORDER — MELOXICAM 15 MG PO TABS: 15.0000 mg | ORAL_TABLET | Freq: Every day | ORAL | 3 refills | Status: DC

## 2018-11-13 NOTE — Patient Instructions (Signed)
Plantar Fasciitis  Plantar fasciitis is a painful foot condition that affects the heel. It occurs when the band of tissue that connects the toes to the heel bone (plantar fascia) becomes irritated. This can happen as the result of exercising too much or doing other repetitive activities (overuse injury). The pain from plantar fasciitis can range from mild irritation to severe pain that makes it difficult to walk or move. The pain is usually worse in the morning after sleeping, or after sitting or lying down for a while. Pain may also be worse after long periods of walking or standing. What are the causes? This condition may be caused by:  Standing for long periods of time.  Wearing shoes that do not have good arch support.  Doing activities that put stress on joints (high-impact activities), including running, aerobics, and ballet.  Being overweight.  An abnormal way of walking (gait).  Tight muscles in the back of your lower leg (calf).  High arches in your feet.  Starting a new athletic activity. What are the signs or symptoms? The main symptom of this condition is heel pain. Pain may:  Be worse with first steps after a time of rest, especially in the morning after sleeping or after you have been sitting or lying down for a while.  Be worse after long periods of standing still.  Decrease after 30-45 minutes of activity, such as gentle walking. How is this diagnosed? This condition may be diagnosed based on your medical history and your symptoms. Your health care provider may ask questions about your activity level. Your health care provider will do a physical exam to check for:  A tender area on the bottom of your foot.  A high arch in your foot.  Pain when you move your foot.  Difficulty moving your foot. You may have imaging tests to confirm the diagnosis, such as:  X-rays.  Ultrasound.  MRI. How is this treated? Treatment for plantar fasciitis depends on how  severe your condition is. Treatment may include:  Rest, ice, applying pressure (compression), and raising the affected foot (elevation). This may be called RICE therapy. Your health care provider may recommend RICE therapy along with over-the-counter pain medicines to manage your pain.  Exercises to stretch your calves and your plantar fascia.  A splint that holds your foot in a stretched, upward position while you sleep (night splint).  Physical therapy to relieve symptoms and prevent problems in the future.  Injections of steroid medicine (cortisone) to relieve pain and inflammation.  Stimulating your plantar fascia with electrical impulses (extracorporeal shock wave therapy). This is usually the last treatment option before surgery.  Surgery, if other treatments have not worked after 12 months. Follow these instructions at home:  Managing pain, stiffness, and swelling  If directed, put ice on the painful area: ? Put ice in a plastic bag, or use a frozen bottle of water. ? Place a towel between your skin and the bag or bottle. ? Roll the bottom of your foot over the bag or bottle. ? Do this for 20 minutes, 2-3 times a day.  Wear athletic shoes that have air-sole or gel-sole cushions, or try wearing soft shoe inserts that are designed for plantar fasciitis.  Raise (elevate) your foot above the level of your heart while you are sitting or lying down. Activity  Avoid activities that cause pain. Ask your health care provider what activities are safe for you.  Do physical therapy exercises and stretches as told   by your health care provider.  Try activities and forms of exercise that are easier on your joints (low-impact). Examples include swimming, water aerobics, and biking. General instructions  Take over-the-counter and prescription medicines only as told by your health care provider.  Wear a night splint while sleeping, if told by your health care provider. Loosen the splint  if your toes tingle, become numb, or turn cold and blue.  Maintain a healthy weight, or work with your health care provider to lose weight as needed.  Keep all follow-up visits as told by your health care provider. This is important. Contact a health care provider if you:  Have symptoms that do not go away after caring for yourself at home.  Have pain that gets worse.  Have pain that affects your ability to move or do your daily activities. Summary  Plantar fasciitis is a painful foot condition that affects the heel. It occurs when the band of tissue that connects the toes to the heel bone (plantar fascia) becomes irritated.  The main symptom of this condition is heel pain that may be worse after exercising too much or standing still for a long time.  Treatment varies, but it usually starts with rest, ice, compression, and elevation (RICE therapy) and over-the-counter medicines to manage pain. This information is not intended to replace advice given to you by your health care provider. Make sure you discuss any questions you have with your health care provider. Document Released: 11/08/2000 Document Revised: 01/26/2017 Document Reviewed: 12/11/2016 [employer]vier Patient Education  2020 [employer]  

## 2018-11-13 NOTE — Progress Notes (Signed)
Subjective:  Patient ID: Carol [name], female    DOB: 1946-02-03, 73 y.o.   MRN: [id number]  Patient Care Team: Baruch Gouty, FNP as PCP - General (Family Medicine) Lavonna Monarch, MD as Consulting Physician (Dermatology) Luberta Mutter, MD as Consulting Physician (Ophthalmology) Ronald Lobo, MD as Consulting Physician (Gastroenterology) Debara Pickett Nadean Corwin, MD as Consulting Physician (Cardiology)   Chief Complaint:  Medical Management of Chronic Issues and Hyperlipidemia   HPI: Carol [name] is a 73 y.o. female presenting on 11/13/2018 for Medical Management of Chronic Issues and Hyperlipidemia  1. Hyperlipidemia LDL goal <70  Compliant with medications - Yes Current medications - Crestor 5 mg Side effects from medications - No Diet - generally healthy Exercise - yes, daily   Lab Results  Component Value Date   CHOL 114 11/07/2018   HDL 50 11/07/2018   LDLCALC 24 05/02/2018   LDLDIRECT 93 10/08/2014   TRIG 213 (H) 11/07/2018   CHOLHDL 2.3 11/07/2018     Family and personal medical history reviewed. Smoking and ETOH history reviewed.    2. Osteopenia of multiple sites  On calcium and Vit D repletion therapy. Last DEXA scan 2018. Will order today. No bone pain, falls, or recent fractures.    3. Plantar fasciitis of right foot  Ongoing right heel pain due to plantar fascitis. Has seen podiatry and tried conservative therapy. Has changed shoes and arch support. Does take NSAID at times with some relief of pain. Pain is worse first thing in the morning or after sitting for a long time, 5/10, aching and tightness. Would like to speak to podiatry about possible surgical intervention as this has been ongoing for several months. No new injury. No new symptoms.      Relevant past medical, surgical, family, and social history reviewed and updated as indicated.  Allergies and medications reviewed and updated. Date reviewed: Chart in Epic.   Past Medical History:   Diagnosis Date  . Carotid artery stenosis   . Cataract   . Diverticulosis   . Fractured hand    right  . Hyperlipidemia   . Osteopenia   . Precancerous skin lesion 2016    Past Surgical History:  Procedure Laterality Date  . CATARACT EXTRACTION W/ INTRAOCULAR LENS IMPLANT Bilateral   . CHOLECYSTECTOMY  2009  . EYE SURGERY      Social History   Socioeconomic History  . Marital status: Widowed    Spouse name: Not on file  . Number of children: Not on file  . Years of education: Not on file  . Highest education level: Not on file  Occupational History  . Not on file  Social Needs  . Financial resource strain: Not on file  . Food insecurity    Worry: Not on file    Inability: Not on file  . Transportation needs    Medical: Not on file    Non-medical: Not on file  Tobacco Use  . Smoking status: Never Smoker  . Smokeless tobacco: Never Used  Substance and Sexual Activity  . Alcohol use: No  . Drug use: No  . Sexual activity: Not on file  Lifestyle  . Physical activity    Days per week: Not on file    Minutes per session: Not on file  . Stress: Not on file  Relationships  . Social Herbalist on phone: Not on file    Gets together: Not on file    Attends  religious service: Not on file    Active member of club or organization: Not on file    Attends meetings of clubs or organizations: Not on file    Relationship status: Not on file  . Intimate partner violence    Fear of current or ex partner: Not on file    Emotionally abused: Not on file    Physically abused: Not on file    Forced sexual activity: Not on file  Other Topics Concern  . Not on file  Social History Narrative  . Not on file    Outpatient Encounter Medications as of 11/13/2018  Medication Sig  . aspirin EC 81 MG tablet Take 81 mg by mouth daily.  . Calcium 500-125 MG-UNIT TABS 1 tablet. 1 tablet daily.  . Cholecalciferol (VITAMIN D3) 2000 units capsule 1 capsule.  . Evolocumab  (REPATHA SURECLICK) 194 MG/ML SOAJ Inject 1 Dose into the skin every 14 (fourteen) days.  . meloxicam (MOBIC) 15 MG tablet Take 1 tablet (15 mg total) by mouth daily.  . Multiple Vitamins tablet 1 tablet. 1 tablet daily.  . rosuvastatin (CRESTOR) 5 MG tablet TAKE 1/2 (ONE-HALF) TABLET BY MOUTH AT BEDTIME  . [DISCONTINUED] meloxicam (MOBIC) 15 MG tablet Take 1 tablet (15 mg total) by mouth daily.  . [DISCONTINUED] omega-3 acid ethyl esters (LOVAZA) 1 g capsule Take 2 capsules (2 g total) by mouth 2 (two) times daily.   No facility-administered encounter medications on file as of 11/13/2018.     Allergies  Allergen Reactions  . Penicillins Hives  . Sulfa Antibiotics   . Sulfamethoxazole Itching  . Lipitor [Atorvastatin] Diarrhea  . Crestor [Rosuvastatin Calcium] Other (See Comments)    Myalgias     Review of Systems  Constitutional: Negative for activity change, appetite change, chills, diaphoresis, fatigue, fever and unexpected weight change.  HENT: Negative.   Eyes: Negative.  Negative for photophobia and visual disturbance.  Respiratory: Negative for cough, chest tightness and shortness of breath.   Cardiovascular: Negative for chest pain, palpitations and leg swelling.  Gastrointestinal: Negative for abdominal pain, anal bleeding, blood in stool, constipation, diarrhea, nausea and vomiting.  Endocrine: Negative.  Negative for cold intolerance, heat intolerance, polydipsia, polyphagia and polyuria.  Genitourinary: Negative for decreased urine volume, difficulty urinating, dysuria, frequency and urgency.  Musculoskeletal: Positive for myalgias. Negative for arthralgias.  Skin: Negative.   Allergic/Immunologic: Negative.   Neurological: Negative for dizziness, tremors, seizures, syncope, facial asymmetry, speech difficulty, weakness, light-headedness, numbness and headaches.  Hematological: Negative.   Psychiatric/Behavioral: Negative for confusion, hallucinations, sleep disturbance  and suicidal ideas.  All other systems reviewed and are negative.       Objective:  BP (!) 139/57 (BP Location: Left Arm, Cuff Size: Large)   Pulse (!) 58   Temp 98 F (36.7 C)   Resp 20   Ht _0  (1.676 m)   Wt 182 lb (82.6 kg)   SpO2 98%   BMI 29.38 kg/m    Wt Readings from Last 3 Encounters:  11/13/18 182 lb (82.6 kg)  05/31/18 187 lb (84.8 kg)  05/07/18 187 lb (84.8 kg)    Physical Exam Vitals signs and nursing note reviewed.  Constitutional:      General: She is not in acute distress.    Appearance: Normal appearance. She is well-developed, well-groomed and overweight. She is not ill-appearing, toxic-appearing or diaphoretic.  HENT:     Head: Normocephalic and atraumatic.     Jaw: There is normal jaw occlusion.  Right Ear: Tympanic membrane, ear canal and external ear normal.     Left Ear: Tympanic membrane, ear canal and external ear normal.     Nose: Nose normal.     Mouth/Throat:     Lips: Pink.     Mouth: Mucous membranes are moist.     Pharynx: Oropharynx is clear. Uvula midline.  Eyes:     Extraocular Movements: Extraocular movements intact.     Right eye: Normal extraocular motion and no nystagmus.     Left eye: Normal extraocular motion and no nystagmus.     Conjunctiva/sclera: Conjunctivae normal.     Pupils: Pupils are equal, round, and reactive to light.     Comments: Ptosis of left eyelid   Neck:     Musculoskeletal: Normal range of motion and neck supple.     Thyroid: No thyroid mass, thyromegaly or thyroid tenderness.     Vascular: No carotid bruit or JVD.     Trachea: Trachea and phonation normal.  Cardiovascular:     Rate and Rhythm: Normal rate and regular rhythm.     Chest Wall: PMI is not displaced.     Pulses: Normal pulses.     Heart sounds: Murmur present. Systolic murmur present with a grade of 2/6. No friction rub. No gallop.   Pulmonary:     Effort: Pulmonary effort is normal. No respiratory distress.     Breath sounds:  Normal breath sounds. No wheezing.  Abdominal:     General: Bowel sounds are normal. There is no distension or abdominal bruit.     Palpations: Abdomen is soft. There is no hepatomegaly or splenomegaly.     Tenderness: There is no abdominal tenderness. There is no right CVA tenderness or left CVA tenderness.     Hernia: No hernia is present.  Musculoskeletal: Normal range of motion.     Right ankle: Normal.     Right lower leg: No edema.     Left lower leg: No edema.     Right foot: Normal range of motion and normal capillary refill. Tenderness present. No bony tenderness, swelling, crepitus, deformity or laceration.       Feet:  Lymphadenopathy:     Cervical: No cervical adenopathy.  Skin:    General: Skin is warm and dry.     Capillary Refill: Capillary refill takes less than 2 seconds.     Coloration: Skin is not cyanotic, jaundiced or pale.     Findings: No rash.  Neurological:     General: No focal deficit present.     Mental Status: She is alert and oriented to person, place, and time.     Cranial Nerves: Cranial nerves are intact.     Sensory: Sensation is intact.     Motor: Motor function is intact.     Coordination: Coordination is intact.     Gait: Gait is intact.     Deep Tendon Reflexes: Reflexes are normal and symmetric.  Psychiatric:        Attention and Perception: Attention and perception normal.        Mood and Affect: Mood and affect normal.        Speech: Speech normal.        Behavior: Behavior normal. Behavior is cooperative.        Thought Content: Thought content normal.        Cognition and Memory: Cognition and memory normal.        Judgment: Judgment normal.  Results for orders placed or performed in visit on 11/07/18  CBC with Differential/Platelet  Result Value Ref Range   WBC 8.1 3.4 - 10.8 x10E3/uL   RBC 4.60 3.77 - 5.28 x10E6/uL   Hemoglobin 13.9 11.1 - 15.9 g/dL   Hematocrit 42.0 34.0 - 46.6 %   MCV 91 79 - 97 fL   MCH 30.2 26.6 -  33.0 pg   MCHC 33.1 31.5 - 35.7 g/dL   RDW 13.0 11.7 - 15.4 %   Platelets 226 150 - 450 x10E3/uL   Neutrophils 59 Not Estab. %   Lymphs 31 Not Estab. %   Monocytes 6 Not Estab. %   Eos 3 Not Estab. %   Basos 1 Not Estab. %   Neutrophils Absolute 4.8 1.4 - 7.0 x10E3/uL   Lymphocytes Absolute 2.5 0.7 - 3.1 x10E3/uL   Monocytes Absolute 0.5 0.1 - 0.9 x10E3/uL   EOS (ABSOLUTE) 0.2 0.0 - 0.4 x10E3/uL   Basophils Absolute 0.1 0.0 - 0.2 x10E3/uL   Immature Granulocytes 0 Not Estab. %   Immature Grans (Abs) 0.0 0.0 - 0.1 x10E3/uL  CMP14+EGFR  Result Value Ref Range   Glucose 96 65 - 99 mg/dL   BUN 16 8 - 27 mg/dL   Creatinine, Ser 0.91 0.57 - 1.00 mg/dL   GFR calc non Af Amer 62 >59 mL/min/1.73   GFR calc Af Amer 71 >59 mL/min/1.73   BUN/Creatinine Ratio 18 12 - 28   Sodium 144 134 - 144 mmol/L   Potassium 4.8 3.5 - 5.2 mmol/L   Chloride 106 96 - 106 mmol/L   CO2 27 20 - 29 mmol/L   Calcium 9.3 8.7 - 10.3 mg/dL   Total Protein 6.8 6.0 - 8.5 g/dL   Albumin 4.4 3.7 - 4.7 g/dL   Globulin, Total 2.4 1.5 - 4.5 g/dL   Albumin/Globulin Ratio 1.8 1.2 - 2.2   Bilirubin Total 0.6 0.0 - 1.2 mg/dL   Alkaline Phosphatase 60 39 - 117 IU/L   AST 16 0 - 40 IU/L   ALT 12 0 - 32 IU/L  Lipid panel  Result Value Ref Range   Cholesterol, Total 114 100 - 199 mg/dL   Triglycerides 213 (H) 0 - 149 mg/dL   HDL 50 >39 mg/dL   VLDL Cholesterol Cal 33 5 - 40 mg/dL   LDL Chol Calc (NIH) 31 0 - 99 mg/dL   Chol/HDL Ratio 2.3 0.0 - 4.4 ratio  VITAMIN D 25 Hydroxy (Vit-D Deficiency, Fractures)  Result Value Ref Range   Vit D, 25-Hydroxy 44.5 30.0 - 100.0 ng/mL       Pertinent labs & imaging results that were available during my care of the patient were reviewed by me and considered in my medical decision making.  Assessment & Plan:  Adore was seen today for medical management of chronic issues and hyperlipidemia.  Diagnoses and all orders for this visit:  Hyperlipidemia LDL goal <70 Diet  encouraged - increase intake of fresh fruits and vegetables, increase intake of lean proteins. Bake, broil, or grill foods. Avoid fried, greasy, and fatty foods. Avoid fast foods. Increase intake of fiber-rich whole grains. Exercise encouraged - at least 150 minutes per week and advance as tolerated.  Goal BMI < 25. Continue medications as prescribed. Follow up in 3-6 months as discussed.   Encounter for osteoporosis screening in asymptomatic postmenopausal patient Osteopenia of multiple sites Continue calcium and Vit D repletion therapy. Will order DEXA today. Will change therapy if warranted.  -  DG Bone Density; Future  Pseudoptosis of left eye Ongoing, no new or worsening problems.   Plantar fasciitis of right foot Symptomatic care discussed. Will initiate NSAID daily for at least 2 weeks. Declines steroids or steroid injection today. Make follow up with podiatry for further treatment. Good arch support and stretching exercises discussed.  -     meloxicam (MOBIC) 15 MG tablet; Take 1 tablet (15 mg total) by mouth daily.  Murmur Followed by cardiology. No associated symptoms.     Continue all other maintenance medications.  Follow up plan: Return in about 6 months (around 05/13/2019), or if symptoms worsen or fail to improve, for Lipids.  Continue healthy lifestyle choices, including diet (rich in fruits, vegetables, and lean proteins, and low in salt and simple carbohydrates) and exercise (at least 30 minutes of moderate physical activity daily).  Educational handout given for plantar fascitis  The above assessment and management plan was discussed with the patient. The patient verbalized understanding of and has agreed to the management plan. Patient is aware to call the clinic if they develop any new symptoms or if symptoms persist or worsen. Patient is aware when to return to the clinic for a follow-up visit. Patient educated on when it is appropriate to go to the emergency  department.   Monia Pouch, FNP-C [facility] [phone]

## 2018-11-28 ENCOUNTER — Encounter: Payer: Self-pay | Admitting: Internal Medicine

## 2018-11-28 ENCOUNTER — Other Ambulatory Visit: Payer: Self-pay

## 2018-11-28 ENCOUNTER — Ambulatory Visit (INDEPENDENT_AMBULATORY_CARE_PROVIDER_SITE_OTHER): Payer: Medicare Other | Admitting: Internal Medicine

## 2018-11-28 VITALS — BP 132/70 | HR 65 | Ht 66.0 in | Wt 182.0 lb

## 2018-11-28 DIAGNOSIS — Z8249 Family history of ischemic heart disease and other diseases of the circulatory system: Secondary | ICD-10-CM

## 2018-11-28 DIAGNOSIS — E785 Hyperlipidemia, unspecified: Secondary | ICD-10-CM

## 2018-11-28 DIAGNOSIS — I6523 Occlusion and stenosis of bilateral carotid arteries: Secondary | ICD-10-CM | POA: Diagnosis not present

## 2018-11-28 NOTE — Progress Notes (Signed)
OFFICE NOTE  Chief Complaint:  No complaints  Primary Care Physician: Baruch Gouty, FNP  HPI:  Carol [name] is a pleasant 73 year old female kindly referred to me by Dr. Laurance Flatten for cardiac evaluation. Recently she underwent lifeline screening was found to have carotid stenosis. She had a formal carotid Doppler performed at [facility] which demonstrated mild to moderate bilateral carotid artery disease. She's been adequately managed in fact is on no Vytorin and 5 fenofibrate for elevated cholesterol. Recent labs indicated an LDL-P of 1143, LDL 95, HDL 68 and triglycerides 68. Generally this is good control however given the fact that she has significant carotid artery disease, her targets for cholesterol management are much lower. We should strive for a LDL cholesterol less than 70. She's a symptomatically regards to chest pain or shortness of breath. Her brother recently died with heart attack at age 91 and of course she is appropriately concerned about that. She also has history of murmur which is been followed.  07/27/2015  Carol [name] returns today for follow-up. She seems to be doing quite well. She is physically active and walks about 3 miles several times a week. She recently had repeat carotid Dopplers which show moderate right internal carotid artery stenosis and mild left internal carotid artery stenosis which is stable compared to her studies a year ago. She also had repeat lipid testing which shows very good control at this point although triglycerides were somewhat elevated. She was started on fenofibrate in addition to her Crestor, and seems to be tolerating this well over the past month. She denies any myalgias. She is on daily aspirin. She denies any chest pain or worsening shortness of breath.  07/26/2016  Carol [name] returns for follow-up. She underwent carotid Dopplers this morning which preliminarily looks stable with a 40-59% R ICA stenosis and a 123456 LICA stenosis. She  is asymptomatic denies any chest pain or worsening shortness of breath. There is a strong family history of carotid artery disease as well as a coronary artery disease in the family. Her PCP has been adjusting her medications as she's had side effects with the statins in the past. She was intolerant to Crestor however this was in addition of fenofibrate. She was also previously on Vytorin 10/20 mg in addition to fenofibrate 148 mg and had side effects. She also states that she felt she had side effects off of fenofibrate. Is not clear if it was just a combination of medications or the statin alone. Currently she is on pitavastatin, but she reports her medications very expensive. Her recent lipid profile 2 weeks ago showed total cholesterol 188, triglycerides 241, HDL-C 47 and LDL-C 93. Goal LDL-C less than 70. Her PCP recommended ezetimibe however she was hesitant to take it as she said it did not work in the past. She did not realize though that Vytorin contained ezetimibe.  07/27/2017  Carol [name] was seen today in follow-up.  She underwent carotid Dopplers today and the official read is not yet performed.  This demonstrated 40 to 59% stenosis in the right and less than 39% stenosis on the left.  This is consistent with her prior values and it is recommended she have follow-up Dopplers in a year.  In the past however we have had difficulty managing her cholesterol.  She is currently only taking 2.5 mg of rosuvastatin daily.  This is about the most that she can manage.  She had significant intolerance to both atorvastatin and rosuvastatin at  doses of 20 and 40 mg.  Labs in March 2019 showed total LDL-PA 1634, LDL-C of 148, indicating significant residual risk.  Her goal LDL should be less than 70.  We previously discussed other management options including ezetimibe and possible PCSK9 inhibitor therapy.  11/13/2017  Carol [name] is seen today in follow-up.  Overall she is doing well.  I recently had lab work  from her PCP.  She seems to be responding to PCSK9 inhibitor.  Her total cholesterol is decreased remarkably from 2 72-1 24, triglycerides went up from 114-163, HDL increased from 47-52 and LDL-C is down from 192-49.  She is also tolerating this well without side effects.  11/28/2018  Carol [name] is seen today in follow-up.  Overall she is doing well.  She seems to be tolerating Repatha.  She is had no significant side effects and also takes low-dose of rosuvastatin once a week.  EKG shows sinus rhythm today.  She had repeat Dopplers of her carotids which showed stable findings in June.  Her cholesterols responded very well to Repatha.  Total cholesterol is now 114, triglycerides have decreased from 325 -> 213, HDL 50 and LDL of 31.  PMHx:  Past Medical History:  Diagnosis Date  . Carotid artery stenosis   . Cataract   . Diverticulosis   . Fractured hand    right  . Hyperlipidemia   . Osteopenia   . Precancerous skin lesion 2016    Past Surgical History:  Procedure Laterality Date  . CATARACT EXTRACTION W/ INTRAOCULAR LENS IMPLANT Bilateral   . CHOLECYSTECTOMY  2009  . EYE SURGERY      FAMHx:  Family History  Problem Relation Age of Onset  . Heart disease Mother   . Heart disease Father   . Cancer Sister        breast  . Hyperlipidemia Sister   . Diabetes Sister   . Cancer Sister        bone  . Alzheimer's disease Sister   . Heart disease Brother   . CAD Brother   . Cancer Sister        breast  . Kidney disease Sister   . Heart disease Brother   . Hypertension Brother   . Alzheimer's disease Brother   . Kidney disease Brother        2018 starting dialysis    SOCHx:   reports that she has never smoked. She has never used smokeless tobacco. She reports that she does not drink alcohol or use drugs.  ALLERGIES:  Allergies  Allergen Reactions  . Penicillins Hives  . Sulfa Antibiotics   . Sulfamethoxazole Itching  . Lipitor [Atorvastatin] Diarrhea  . Crestor  [Rosuvastatin Calcium] Other (See Comments)    Myalgias     ROS: Pertinent items noted in HPI and remainder of comprehensive ROS otherwise negative.  HOME MEDS: Current Outpatient Medications  Medication Sig Dispense Refill  . aspirin EC 81 MG tablet Take 81 mg by mouth daily.    . Calcium 500-125 MG-UNIT TABS 1 tablet. 1 tablet daily.    . Cholecalciferol (VITAMIN D3) 2000 units capsule 1 capsule.    . Evolocumab (REPATHA SURECLICK) XX123456 MG/ML SOAJ Inject 1 Dose into the skin every 14 (fourteen) days. 6 pen 3  . meloxicam (MOBIC) 15 MG tablet Take 1 tablet (15 mg total) by mouth daily. 30 tablet 3  . Multiple Vitamins tablet 1 tablet. 1 tablet daily.    . rosuvastatin (CRESTOR) 5 MG tablet TAKE  1/2 (ONE-HALF) TABLET BY MOUTH AT BEDTIME 45 tablet 0   No current facility-administered medications for this visit.     LABS/IMAGING: No results found for this or any previous visit (from the past 48 hour(s)). No results found.  WEIGHTS: Wt Readings from Last 3 Encounters:  11/28/18 182 lb (82.6 kg)  11/13/18 182 lb (82.6 kg)  05/31/18 187 lb (84.8 kg)    VITALS: BP 132/70 (BP Location: Left Arm, Patient Position: Sitting, Cuff Size: Normal)   Pulse 65   Ht 5\' 6"  (1.676 m)   Wt 182 lb (82.6 kg)   BMI 29.38 kg/m   EXAM: General appearance: alert and no distress Neck: no carotid bruit, no JVD and thyroid not enlarged, symmetric, no tenderness/mass/nodules Lungs: clear to auscultation bilaterally Heart: regular rate and rhythm Abdomen: soft, non-tender; bowel sounds normal; no masses,  no organomegaly Extremities: extremities normal, atraumatic, no cyanosis or edema Pulses: 2+ and symmetric Skin: Skin color, texture, turgor normal. No rashes or lesions Neurologic: Grossly normal Psych: Pleasant  EKG: Normal sinus rhythm at 63-personally reviewed  ASSESSMENT: 1. Bilateral carotid artery stenosis 2. Dyslipidemia  3. Relative statin intolerance 4. Family history of  premature coronary artery disease  PLAN: 1.   Mrs. Sadek has had stable bilateral carotid artery disease.  Her cholesterol is now much improved on Repatha and very low dose, intermittent statin.  She is now at goal LDL less than 70.  Her triglycerides did stay slightly elevated.  I would recommend we continue with her current therapies and monitor her carotid artery disease annually or sooner as necessary.  Pixie Casino, MD, [facility], [place] Director of the Advanced Lipid Disorders &  Cardiovascular Risk Reduction Clinic Diplomate of the American Board of Clinical Lipidology Attending Cardiologist  Direct Dial: [phone]  Fax: [phone]  Website:  [email or web address] 11/28/2018, 10:13 AM

## 2018-11-28 NOTE — Patient Instructions (Signed)
Medication Instructions:  Your physician recommends that you continue on your current medications as directed. Please refer to the Current Medication list given to you today.  If you need a refill on your cardiac medications before your next appointment, please call your pharmacy.   Lab work: NONE   Testing/Procedures: NONE  Follow-Up: At [facility], you and your health needs are our priority.  As part of our continuing mission to provide you with exceptional heart care, we have created designated Provider Care Teams.  These Care Teams include your primary Cardiologist (physician) and Advanced Practice Providers (APPs -  Physician Assistants and Nurse Practitioners) who all work together to provide you with the care you need, when you need it. You will need a follow up appointment in 12 months.  Please call our office 2 months in advance to schedule this appointment.  You may see DR HILTY or one of the following Advanced Practice Providers on your designated Care Team: Almyra Deforest, [place] . Fabian Sharp, PA-C

## 2018-12-03 ENCOUNTER — Ambulatory Visit (INDEPENDENT_AMBULATORY_CARE_PROVIDER_SITE_OTHER): Payer: Medicare Other | Admitting: *Deleted

## 2018-12-03 DIAGNOSIS — Z0000 Encounter for general adult medical examination without abnormal findings: Secondary | ICD-10-CM

## 2018-12-03 NOTE — Patient Instructions (Signed)
Preventive Care 75 Years and Older, Female Preventive care refers to lifestyle choices and visits with your health care provider that can promote health and wellness. This includes:  A yearly physical exam. This is also called an annual well check.  Regular dental and eye exams.  Immunizations.  Screening for certain conditions.  Healthy lifestyle choices, such as diet and exercise. What can I expect for my preventive care visit? Physical exam Your health care provider will check:  Height and weight. These may be used to calculate body mass index (BMI), which is a measurement that tells if you are at a healthy weight.  Heart rate and blood pressure.  Your skin for abnormal spots. Counseling Your health care provider may ask you questions about:  Alcohol, tobacco, and drug use.  Emotional well-being.  Home and relationship well-being.  Sexual activity.  Eating habits.  History of falls.  Memory and ability to understand (cognition).  Work and work Statistician.  Pregnancy and menstrual history. What immunizations do I need?  Influenza (flu) vaccine  This is recommended every year. Tetanus, diphtheria, and pertussis (Tdap) vaccine  You may need a Td booster every 10 years. Varicella (chickenpox) vaccine  You may need this vaccine if you have not already been vaccinated. Zoster (shingles) vaccine  You may need this after age 75. Pneumococcal conjugate (PCV13) vaccine  One dose is recommended after age 75. Pneumococcal polysaccharide (PPSV23) vaccine  One dose is recommended after age 75. Measles, mumps, and rubella (MMR) vaccine  You may need at least one dose of MMR if you were born in 1957 or later. You may also need a second dose. Meningococcal conjugate (MenACWY) vaccine  You may need this if you have certain conditions. Hepatitis A vaccine  You may need this if you have certain conditions or if you travel or work in places where you may be exposed  to hepatitis A. Hepatitis B vaccine  You may need this if you have certain conditions or if you travel or work in places where you may be exposed to hepatitis B. Haemophilus influenzae type b (Hib) vaccine  You may need this if you have certain conditions. You may receive vaccines as individual doses or as more than one vaccine together in one shot (combination vaccines). Talk with your health care provider about the risks and benefits of combination vaccines. What tests do I need? Blood tests  Lipid and cholesterol levels. These may be checked every 5 years, or more frequently depending on your overall health.  Hepatitis C test.  Hepatitis B test. Screening  Lung cancer screening. You may have this screening every year starting at age 75 if you have a 30-pack-year history of smoking and currently smoke or have quit within the past 15 years.  Colorectal cancer screening. All adults should have this screening starting at age 75 and continuing until age 15. Your health care provider may recommend screening at age 75 if you are at increased risk. You will have tests every 1-10 years, depending on your results and the type of screening test.  Diabetes screening. This is done by checking your blood sugar (glucose) after you have not eaten for a while (fasting). You may have this done every 1-3 years.  Mammogram. This may be done every 1-2 years. Talk with your health care provider about how often you should have regular mammograms.  BRCA-related cancer screening. This may be done if you have a family history of breast, ovarian, tubal, or peritoneal cancers.  Other tests  Sexually transmitted disease (STD) testing.  Bone density scan. This is done to screen for osteoporosis. You may have this done starting at age 75. Follow these instructions at home: Eating and drinking  Eat a diet that includes fresh fruits and vegetables, whole grains, lean protein, and low-fat dairy products. Limit  your intake of foods with high amounts of sugar, saturated fats, and salt.  Take vitamin and mineral supplements as recommended by your health care provider.  Do not drink alcohol if your health care provider tells you not to drink.  If you drink alcohol: ? Limit how much you have to 0-1 drink a day. ? Be aware of how much alcohol is in your drink. In the [place] one drink equals one 12 oz bottle of beer (355 mL), one 5 oz glass of wine (148 mL), or one 1 oz glass of hard liquor (44 mL). Lifestyle  Take daily care of your teeth and gums.  Stay active. Exercise for at least 30 minutes on 5 or more days each week.  Do not use any products that contain nicotine or tobacco, such as cigarettes, e-cigarettes, and chewing tobacco. If you need help quitting, ask your health care provider.  If you are sexually active, practice safe sex. Use a condom or other form of protection in order to prevent STIs (sexually transmitted infections).  Talk with your health care provider about taking a low-dose aspirin or statin. What's next?  Go to your health care provider once a year for a well check visit.  Ask your health care provider how often you should have your eyes and teeth checked.  Stay up to date on all vaccines. This information is not intended to replace advice given to you by your health care provider. Make sure you discuss any questions you have with your health care provider. Document Released: 03/12/2015 Document Revised: 02/07/2018 Document Reviewed: 02/07/2018 Elsevier Patient Education  2020 [employer].

## 2018-12-03 NOTE — Progress Notes (Signed)
MEDICARE ANNUAL WELLNESS VISIT  12/03/2018  Telephone Visit Disclaimer This Medicare AWV was conducted by telephone due to national recommendations for restrictions regarding the COVID-19 Pandemic (e.g. social distancing).  I verified, using two identifiers, that I am speaking with Carol [name] or their authorized healthcare agent. I discussed the limitations, risks, security, and privacy concerns of performing an evaluation and management service by telephone and the potential availability of an in-person appointment in the future. The patient expressed understanding and agreed to proceed.   Subjective:  Carol [name] is a 73 y.o. female patient of [facility]akes, Connye Burkitt, [place] who had a Medicare Annual Wellness Visit today via telephone. Carol [name] is Retired from working at [employer] for 30 years and lives alone. she has 2 children. she reports that she is socially active and does interact with friends/family regularly. she is moderately physically active and enjoys working in her yard, walking, watching golf and traveling before the [place] hit.  Patient Care Team: Baruch Gouty, FNP as PCP - General (Family Medicine) Lavonna Monarch, MD as Consulting Physician (Dermatology) Luberta Mutter, MD as Consulting Physician (Ophthalmology) Ronald Lobo, MD as Consulting Physician (Gastroenterology) Pixie Casino, MD as Consulting Physician (Cardiology)  Advanced Directives 12/03/2018 11/27/2017 02/03/2016 10/07/2014  Does Patient Have a Medical Advance Directive? Yes Yes Yes Yes  Type of Advance Directive Living will;Healthcare Power of [place];Living will [place];Living will [place];Living will  Does patient want to make changes to medical advance directive? No - Patient declined No - Patient declined No - Patient declined Yes - information given  Copy of [place] in Chart? No - copy  requested No - copy requested No - copy requested No - copy requested  Would patient like information on creating a medical advance directive? - No - Patient declined - [facility] Utilization Over the Past 12 Months: # of hospitalizations or ER visits: 0 # of surgeries: 0  Review of Systems    Patient reports that her overall health is unchanged compared to last year.  History obtained from chart review  Patient Reported Readings (BP, Pulse, CBG, Weight, etc) none  Pain Assessment Pain : No/denies pain     Current Medications & Allergies (verified) Allergies as of 12/03/2018      Reactions   Penicillins Hives   Sulfa Antibiotics    Sulfamethoxazole Itching   Lipitor [atorvastatin] Diarrhea   Crestor [rosuvastatin Calcium] Other (See Comments)   Myalgias      Medication List       Accurate as of December 03, 2018  8:50 AM. If you have any questions, ask your nurse or doctor.        aspirin EC 81 MG tablet Take 81 mg by mouth daily.   Calcium 500-125 MG-UNIT Tabs 1 tablet. 1 tablet daily.   Evolocumab 140 MG/ML Soaj Commonly known as: Repatha SureClick Inject 1 Dose into the skin every 14 (fourteen) days.   meloxicam 15 MG tablet Commonly known as: MOBIC Take 1 tablet (15 mg total) by mouth daily.   Multiple Vitamins tablet 1 tablet. 1 tablet daily.   rosuvastatin 5 MG tablet Commonly known as: CRESTOR TAKE 1/2 (ONE-HALF) TABLET BY MOUTH AT BEDTIME   Vitamin D3 50 MCG (2000 UT) capsule 1 capsule.       History (reviewed): Past Medical History:  Diagnosis Date  . Carotid artery stenosis   .  Cataract   . Diverticulosis   . Fractured hand    right  . Hyperlipidemia   . Osteopenia   . Precancerous skin lesion 2016   Past Surgical History:  Procedure Laterality Date  . CATARACT EXTRACTION W/ INTRAOCULAR LENS IMPLANT Bilateral   . CHOLECYSTECTOMY  2009  . EYE SURGERY     Family History  Problem Relation Age of Onset  . Heart disease Mother    . Heart disease Father   . Cancer Sister        breast  . Hyperlipidemia Sister   . Diabetes Sister   . Cancer Sister        bone  . Alzheimer's disease Sister   . Heart disease Brother   . CAD Brother   . Cancer Sister        breast  . Kidney disease Sister   . Heart disease Brother   . Hypertension Brother   . Alzheimer's disease Brother   . Kidney disease Brother        2018 starting dialysis   Social History   Socioeconomic History  . Marital status: Widowed    Spouse name: Not on file  . Number of children: 2  . Years of education: 61  . Highest education level: High school graduate  Occupational History  . Occupation: retired  Scientific laboratory technician  . Financial resource strain: Not hard at all  . Food insecurity    Worry: Never true    Inability: Never true  . Transportation needs    Medical: No    Non-medical: No  Tobacco Use  . Smoking status: Never Smoker  . Smokeless tobacco: Never Used  Substance and Sexual Activity  . Alcohol use: No  . Drug use: No  . Sexual activity: Not Currently  Lifestyle  . Physical activity    Days per week: 4 days    Minutes per session: 90 min  . Stress: Not at all  Relationships  . Social connections    Talks on phone: More than three times a week    Gets together: More than three times a week    Attends religious service: More than 4 times per year    Active member of club or organization: Yes    Attends meetings of clubs or organizations: More than 4 times per year    Relationship status: Widowed  Other Topics Concern  . Not on file  Social History Narrative  . Not on file    Activities of Daily Living In your present state of health, do you have any difficulty performing the following activities: 12/03/2018  Hearing? Y  Comment wears bilateral hearing aids  Vision? N  Comment gets yearly eye exams  Difficulty concentrating or making decisions? N  Walking or climbing stairs? N  Dressing or bathing? N  Doing  errands, shopping? N  Preparing Food and eating ? N  Using the Toilet? N  In the past six months, have you accidently leaked urine? N  Do you have problems with loss of bowel control? N  Managing your Medications? N  Managing your Finances? N  Housekeeping or managing your Housekeeping? N  Some recent data might be hidden    Patient Education/ Literacy How often do you need to have someone help you when you read instructions, pamphlets, or other written materials from your doctor or pharmacy?: 1 - Never What is the last grade level you completed in school?: 12th grade  Exercise Current Exercise Habits:  Home exercise routine, Type of exercise: walking, Time (Minutes): > 60, Frequency (Times/Week): 4, Weekly Exercise (Minutes/Week): 0, Intensity: Moderate, Exercise limited by: None identified  Diet Patient reports consuming 2 meals a day and 1 snack(s) a day Patient reports that her primary diet is: Regular Patient reports that she does have regular access to food.   Depression Screen PHQ 2/9 Scores 12/03/2018 11/13/2018 05/31/2018 05/07/2018 11/27/2017 11/22/2017 11/06/2017  PHQ - 2 Score 0 0 0 0 0 0 0     Fall Risk Fall Risk  12/03/2018 11/13/2018 05/31/2018 05/07/2018 11/27/2017  Falls in the past year? 0 0 0 0 No  Number falls in past yr: 0 - - - -  Injury with Fall? 0 - - - -  Comment - - - - -  Follow up Falls prevention discussed - - - -  Comment Get rid of all throw rugs in the house, adequate lighting in the walkways and grab bars in the bathroom - - - -     Objective:  Carol [name] seemed alert and oriented and she participated appropriately during our telephone visit.  Blood Pressure Weight BMI  BP Readings from Last 3 Encounters:  11/28/18 132/70  11/13/18 (!) 139/57  07/04/18 (!) 170/74   Wt Readings from Last 3 Encounters:  11/28/18 182 lb (82.6 kg)  11/13/18 182 lb (82.6 kg)  05/31/18 187 lb (84.8 kg)   BMI Readings from Last 1 Encounters:  11/28/18 29.38 kg/m     *Unable to obtain current vital signs, weight, and BMI due to telephone visit type  Hearing/Vision  . Jacoya did not seem to have difficulty with hearing/understanding during the telephone conversation . Reports that she has had a formal eye exam by an eye care professional within the past year . Reports that she has not had a formal hearing evaluation within the past year *Unable to fully assess hearing and vision during telephone visit type  Cognitive Function: 6CIT Screen 12/03/2018  What Year? 0 points  What month? 0 points  What time? 0 points  Count back from 20 0 points  Months in reverse 0 points  Repeat phrase 0 points  Total Score 0   (Normal:0-7, Significant for Dysfunction: >8)  Normal Cognitive Function Screening: Yes   Immunization & Health Maintenance Record Immunization History  Administered Date(s) Administered  . Influenza,inj,Quad PF,6+ Mos 11/26/2012, 11/26/2013, 12/04/2014  . Tdap 02/27/2006  . Zoster 12/19/2013    Health Maintenance  Topic Date Due  . TETANUS/TDAP  11/13/2019 (Originally 02/28/2016)  . PNA vac Low Risk Adult (1 of 2 - PCV13) 11/13/2019 (Originally 09/24/2008)  . DEXA SCAN  12/30/2018  . MAMMOGRAM  01/18/2019  . COLONOSCOPY  05/24/2021  . Hepatitis C Screening  Completed  . INFLUENZA VACCINE  Discontinued       Assessment  This is a routine wellness examination for ZIAH [place].  Health Maintenance: Due or Overdue There are no preventive care reminders to display for this patient.  Carol [name] does not need a referral for Community Assistance: Care Management:   no Social Work:    no Prescription Assistance:  no Nutrition/Diabetes Education:  no   Plan:  Personalized Goals Goals Addressed            This Visit's Progress   . DIET - INCREASE WATER INTAKE       Try to drink 6-8 glasses of water daily.      Personalized Health Maintenance & Screening Recommendations  Pneumococcal vaccine  Td vaccine  Advanced directives: has an advanced directive - a copy HAS NOT been provided.  Lung Cancer Screening Recommended: no (Low Dose CT Chest recommended if Age 62-80 years, 30 pack-year currently smoking OR have quit w/in past 15 years) Hepatitis C Screening recommended: no HIV Screening recommended: no  Advanced Directives: Written information was not prepared per patient's request.  Referrals & Orders No orders of the defined types were placed in this encounter.   Follow-up Plan . Follow-up with Baruch Gouty, FNP as planned . Consider TDAP and Prevnar13 vaccines at your next visit with your PCP . Bring a copy of your Advanced Directives in for our records   I have personally reviewed and noted the following in the patient's chart:   . Medical and social history . Use of alcohol, tobacco or illicit drugs  . Current medications and supplements . Functional ability and status . Nutritional status . Physical activity . Advanced directives . List of other physicians . Hospitalizations, surgeries, and ER visits in previous 12 months . Vitals . Screenings to include cognitive, depression, and falls . Referrals and appointments  In addition, I have reviewed and discussed with Carol [name] certain preventive protocols, quality metrics, and best practice recommendations. A written personalized care plan for preventive services as well as general preventive health recommendations is available and can be mailed to the patient at her request.      Milas Hock, LPN  [id number]

## 2018-12-27 ENCOUNTER — Other Ambulatory Visit: Payer: Self-pay

## 2018-12-27 MED ORDER — ROSUVASTATIN CALCIUM 5 MG PO TABS: ORAL_TABLET | ORAL | 0 refills | Status: DC

## 2019-01-29 ENCOUNTER — Other Ambulatory Visit (HOSPITAL_COMMUNITY): Payer: Self-pay | Admitting: Family Medicine

## 2019-01-29 DIAGNOSIS — Z1231 Encounter for screening mammogram for malignant neoplasm of breast: Secondary | ICD-10-CM

## 2019-01-31 ENCOUNTER — Telehealth: Payer: Self-pay | Admitting: Internal Medicine

## 2019-01-31 NOTE — Telephone Encounter (Signed)
PA for repatha sureclick submitted via [email or web address] (Key: [id number])

## 2019-02-03 ENCOUNTER — Other Ambulatory Visit: Payer: Self-pay | Admitting: Internal Medicine

## 2019-02-03 NOTE — Telephone Encounter (Signed)
Med approved until 01/31/2020

## 2019-02-10 ENCOUNTER — Other Ambulatory Visit: Payer: Self-pay

## 2019-02-10 ENCOUNTER — Ambulatory Visit (HOSPITAL_COMMUNITY)
Admission: RE | Admit: 2019-02-10 | Discharge: 2019-02-10 | Disposition: A | Discharge status: Home / Self Care | Payer: Medicare Other | Source: Ambulatory Visit | Attending: Family Medicine | Admitting: Family Medicine

## 2019-02-10 DIAGNOSIS — Z1231 Encounter for screening mammogram for malignant neoplasm of breast: Secondary | ICD-10-CM | POA: Diagnosis not present

## 2019-03-20 ENCOUNTER — Telehealth: Payer: Self-pay | Admitting: Internal Medicine

## 2019-03-20 NOTE — Telephone Encounter (Signed)
We are recommending the COVID-19 vaccine to all of our patients. Cardiac medications (including blood thinners) should not deter anyone from being vaccinated and there is no need to hold any of those medications prior to vaccine administration.     Currently, there is a hotline to call (active 03/07/19) to schedule vaccination appointments as no walk-ins will be accepted.   Number: [phone].    If an appointment is not available please go to [email or web address] to sign up for notification when additional vaccine appointments are available.   If you have further questions or concerns about the vaccine process, please visit [email or web address] or contact your primary care physician.   I have informed patient of instructions.   

## 2019-04-01 ENCOUNTER — Other Ambulatory Visit: Payer: Self-pay | Admitting: Family Medicine

## 2019-04-13 ENCOUNTER — Ambulatory Visit: Payer: Medicare Other

## 2019-04-30 DIAGNOSIS — Z0120 Encounter for dental examination and cleaning without abnormal findings: Secondary | ICD-10-CM | POA: Diagnosis not present

## 2019-05-06 ENCOUNTER — Other Ambulatory Visit: Payer: Self-pay

## 2019-05-06 ENCOUNTER — Other Ambulatory Visit: Payer: Medicare Other

## 2019-05-06 DIAGNOSIS — Z8249 Family history of ischemic heart disease and other diseases of the circulatory system: Secondary | ICD-10-CM

## 2019-05-06 DIAGNOSIS — E559 Vitamin D deficiency, unspecified: Secondary | ICD-10-CM | POA: Diagnosis not present

## 2019-05-06 DIAGNOSIS — E785 Hyperlipidemia, unspecified: Secondary | ICD-10-CM | POA: Diagnosis not present

## 2019-05-07 LAB — CBC WITH DIFFERENTIAL/PLATELET
Basophils Absolute: 0 10*3/uL (ref 0.0–0.2)
Basos: 1 %
EOS (ABSOLUTE): 0.2 10*3/uL (ref 0.0–0.4)
Eos: 3 %
Hematocrit: 44.3 % (ref 34.0–46.6)
Hemoglobin: 14.2 g/dL (ref 11.1–15.9)
Immature Grans (Abs): 0 10*3/uL (ref 0.0–0.1)
Immature Granulocytes: 0 %
Lymphocytes Absolute: 2.3 10*3/uL (ref 0.7–3.1)
Lymphs: 37 %
MCH: 29.8 pg (ref 26.6–33.0)
MCHC: 32.1 g/dL (ref 31.5–35.7)
MCV: 93 fL (ref 79–97)
Monocytes Absolute: 0.5 10*3/uL (ref 0.1–0.9)
Monocytes: 8 %
Neutrophils Absolute: 3.2 10*3/uL (ref 1.4–7.0)
Neutrophils: 51 %
Platelets: 191 10*3/uL (ref 150–450)
RBC: 4.76 x10E6/uL (ref 3.77–5.28)
RDW: 12.9 % (ref 11.7–15.4)
WBC: 6.3 10*3/uL (ref 3.4–10.8)

## 2019-05-07 LAB — CMP14+EGFR
ALT: 18 IU/L (ref 0–32)
AST: 22 IU/L (ref 0–40)
Albumin/Globulin Ratio: 1.5 (ref 1.2–2.2)
Albumin: 4.3 g/dL (ref 3.7–4.7)
Alkaline Phosphatase: 77 IU/L (ref 39–117)
BUN/Creatinine Ratio: 16 (ref 12–28)
BUN: 16 mg/dL (ref 8–27)
Bilirubin Total: 0.9 mg/dL (ref 0.0–1.2)
CO2: 25 mmol/L (ref 20–29)
Calcium: 9.7 mg/dL (ref 8.7–10.3)
Chloride: 107 mmol/L — ABNORMAL HIGH (ref 96–106)
Creatinine, Ser: 0.99 mg/dL (ref 0.57–1.00)
GFR calc Af Amer: 64 mL/min/{1.73_m2} (ref >59)
GFR calc non Af Amer: 56 mL/min/{1.73_m2} — ABNORMAL LOW (ref >59)
Globulin, Total: 2.8 g/dL (ref 1.5–4.5)
Glucose: 101 mg/dL — ABNORMAL HIGH (ref 65–99)
Potassium: 4.6 mmol/L (ref 3.5–5.2)
Sodium: 145 mmol/L — ABNORMAL HIGH (ref 134–144)
Total Protein: 7.1 g/dL (ref 6.0–8.5)

## 2019-05-07 LAB — LIPID PANEL
Chol/HDL Ratio: 2.2 ratio (ref 0.0–4.4)
Cholesterol, Total: 122 mg/dL (ref 100–199)
HDL: 55 mg/dL (ref >39)
LDL Chol Calc (NIH): 37 mg/dL (ref 0–99)
Triglycerides: 185 mg/dL — ABNORMAL HIGH (ref 0–149)
VLDL Cholesterol Cal: 30 mg/dL (ref 5–40)

## 2019-05-07 LAB — VITAMIN D 25 HYDROXY (VIT D DEFICIENCY, FRACTURES): Vit D, 25-Hydroxy: 34.6 ng/mL (ref 30.0–100.0)

## 2019-05-13 ENCOUNTER — Other Ambulatory Visit: Payer: Self-pay | Admitting: Internal Medicine

## 2019-05-14 ENCOUNTER — Ambulatory Visit: Payer: Medicare Other | Admitting: Family Medicine

## 2019-05-14 ENCOUNTER — Other Ambulatory Visit: Payer: Medicare Other

## 2019-05-14 NOTE — Telephone Encounter (Signed)
Rx(s) sent to pharmacy electronically.  

## 2019-05-15 ENCOUNTER — Ambulatory Visit (INDEPENDENT_AMBULATORY_CARE_PROVIDER_SITE_OTHER): Payer: Medicare Other | Admitting: Family Medicine

## 2019-05-15 ENCOUNTER — Ambulatory Visit (INDEPENDENT_AMBULATORY_CARE_PROVIDER_SITE_OTHER): Payer: Medicare Other

## 2019-05-15 ENCOUNTER — Encounter: Payer: Self-pay | Admitting: Family Medicine

## 2019-05-15 ENCOUNTER — Other Ambulatory Visit: Payer: Self-pay

## 2019-05-15 VITALS — BP 118/60 | HR 72 | Temp 98.4°F | Resp 20 | Ht 66.0 in | Wt 184.0 lb

## 2019-05-15 DIAGNOSIS — Z6829 Body mass index (BMI) 29.0-29.9, adult: Secondary | ICD-10-CM

## 2019-05-15 DIAGNOSIS — E785 Hyperlipidemia, unspecified: Secondary | ICD-10-CM | POA: Diagnosis not present

## 2019-05-15 DIAGNOSIS — M8589 Other specified disorders of bone density and structure, multiple sites: Secondary | ICD-10-CM

## 2019-05-15 DIAGNOSIS — I6523 Occlusion and stenosis of bilateral carotid arteries: Secondary | ICD-10-CM

## 2019-05-15 DIAGNOSIS — M8588 Other specified disorders of bone density and structure, other site: Secondary | ICD-10-CM

## 2019-05-15 DIAGNOSIS — Z780 Asymptomatic menopausal state: Secondary | ICD-10-CM | POA: Diagnosis not present

## 2019-05-15 DIAGNOSIS — Z13820 Encounter for screening for osteoporosis: Secondary | ICD-10-CM

## 2019-05-15 NOTE — Patient Instructions (Addendum)
Voltaren Gel three times daily   Health Maintenance After Age 76 After age 51, you are at a higher risk for certain long-term diseases and infections as well as injuries from falls. Falls are a major cause of broken bones and head injuries in people who are older than age 70. Getting regular preventive care can help to keep you healthy and well. Preventive care includes getting regular testing and making lifestyle changes as recommended by your health care provider. Talk with your health care provider about:  Which screenings and tests you should have. A screening is a test that checks for a disease when you have no symptoms.  A diet and exercise plan that is right for you. What should I know about screenings and tests to prevent falls? Screening and testing are the best ways to find a health problem early. Early diagnosis and treatment give you the best chance of managing medical conditions that are common after age 4. Certain conditions and lifestyle choices may make you more likely to have a fall. Your health care provider may recommend:  Regular vision checks. Poor vision and conditions such as cataracts can make you more likely to have a fall. If you wear glasses, make sure to get your prescription updated if your vision changes.  Medicine review. Work with your health care provider to regularly review all of the medicines you are taking, including over-the-counter medicines. Ask your health care provider about any side effects that may make you more likely to have a fall. Tell your health care provider if any medicines that you take make you feel dizzy or sleepy.  Osteoporosis screening. Osteoporosis is a condition that causes the bones to get weaker. This can make the bones weak and cause them to break more easily.  Blood pressure screening. Blood pressure changes and medicines to control blood pressure can make you feel dizzy.  Strength and balance checks. Your health care provider may  recommend certain tests to check your strength and balance while standing, walking, or changing positions.  Foot health exam. Foot pain and numbness, as well as not wearing proper footwear, can make you more likely to have a fall.  Depression screening. You may be more likely to have a fall if you have a fear of falling, feel emotionally low, or feel unable to do activities that you used to do.  Alcohol use screening. Using too much alcohol can affect your balance and may make you more likely to have a fall. What actions can I take to lower my risk of falls? General instructions  Talk with your health care provider about your risks for falling. Tell your health care provider if: ? You fall. Be sure to tell your health care provider about all falls, even ones that seem minor. ? You feel dizzy, sleepy, or off-balance.  Take over-the-counter and prescription medicines only as told by your health care provider. These include any supplements.  Eat a healthy diet and maintain a healthy weight. A healthy diet includes low-fat dairy products, low-fat (lean) meats, and fiber from whole grains, beans, and lots of fruits and vegetables. Home safety  Remove any tripping hazards, such as rugs, cords, and clutter.  Install safety equipment such as grab bars in bathrooms and safety rails on stairs.  Keep rooms and walkways well-lit. Activity   Follow a regular exercise program to stay fit. This will help you maintain your balance. Ask your health care provider what types of exercise are appropriate for you.  If you need a cane or walker, use it as recommended by your health care provider.  Wear supportive shoes that have nonskid soles. Lifestyle  Do not drink alcohol if your health care provider tells you not to drink.  If you drink alcohol, limit how much you have: ? 0-1 drink a day for women. ? 0-2 drinks a day for men.  Be aware of how much alcohol is in your drink. In the [place] one drink  equals one typical bottle of beer (12 oz), one-half glass of wine (5 oz), or one shot of hard liquor (1 oz).  Do not use any products that contain nicotine or tobacco, such as cigarettes and e-cigarettes. If you need help quitting, ask your health care provider. Summary  Having a healthy lifestyle and getting preventive care can help to protect your health and wellness after age 37.  Screening and testing are the best way to find a health problem early and help you avoid having a fall. Early diagnosis and treatment give you the best chance for managing medical conditions that are more common for people who are older than age 87.  Falls are a major cause of broken bones and head injuries in people who are older than age 71. Take precautions to prevent a fall at home.  Work with your health care provider to learn what changes you can make to improve your health and wellness and to prevent falls. This information is not intended to replace advice given to you by your health care provider. Make sure you discuss any questions you have with your health care provider. Document Revised: 06/06/2018 Document Reviewed: 12/27/2016 Elsevier Patient Education  2020 [employer].

## 2019-05-15 NOTE — Progress Notes (Signed)
Subjective:  Patient ID: Carol [name], female    DOB: 07-22-1945, 74 y.o.   MRN: [id number]  Patient Care Team: Baruch Gouty, FNP as PCP - General (Family Medicine) Lavonna Monarch, MD as Consulting Physician (Dermatology) Luberta Mutter, MD as Consulting Physician (Ophthalmology) Ronald Lobo, MD as Consulting Physician (Gastroenterology) Debara Pickett Nadean Corwin, MD as Consulting Physician (Cardiology)   Chief Complaint:  Medical Management of Chronic Issues (6 mo ) and Hyperlipidemia   HPI: Carol [name] is a 74 y.o. female presenting on 05/15/2019 for Medical Management of Chronic Issues (6 mo ) and Hyperlipidemia   1. Osteopenia of multiple sites On calcium and Vit D repletion daily. Will have DEXA today.   2. Hyperlipidemia LDL goal <70 Doing well on repatha and crestor. Does try to watch diet. Is active on a regular basis. Reports some myalgias with the statin but not terrible.   3. BMI 29.0-29.9,adult Does try to watch diet and is active on a daily basis.   4. Bilateral carotid artery stenosis Last carotid US in June 2020. Will schedule a follow up in May for yearly [place]. No headache, dizziness, weakness, or confusion.      Relevant past medical, surgical, family, and social history reviewed and updated as indicated.  Allergies and medications reviewed and updated. Date reviewed: Chart in Epic.   Past Medical History:  Diagnosis Date  . Carotid artery stenosis   . Cataract   . Diverticulosis   . Fractured hand    right  . Hyperlipidemia   . Osteopenia   . Precancerous skin lesion 2016    Past Surgical History:  Procedure Laterality Date  . CATARACT EXTRACTION W/ INTRAOCULAR LENS IMPLANT Bilateral   . CHOLECYSTECTOMY  2009  . EYE SURGERY      Social History   Socioeconomic History  . Marital status: Widowed    Spouse name: Not on file  . Number of children: 2  . Years of education: 67  . Highest education level: High school graduate    Occupational History  . Occupation: retired  Tobacco Use  . Smoking status: Never Smoker  . Smokeless tobacco: Never Used  Substance and Sexual Activity  . Alcohol use: No  . Drug use: No  . Sexual activity: Not Currently  Other Topics Concern  . Not on file  Social History Narrative  . Not on file   Social Determinants of Health   Financial Resource Strain: Low Risk   . Difficulty of Paying Living Expenses: Not hard at all  Food Insecurity: No Food Insecurity  . Worried About Charity fundraiser in the Last Year: Never true  . Ran Out of Food in the Last Year: Never true  Transportation Needs: No Transportation Needs  . Lack of Transportation (Medical): No  . Lack of Transportation (Non-Medical): No  Physical Activity: Sufficiently Active  . Days of Exercise per Week: 4 days  . Minutes of Exercise per Session: 90 min  Stress: No Stress Concern Present  . Feeling of Stress : Not at all  Social Connections: Slightly Isolated  . Frequency of Communication with Friends and Family: More than three times a week  . Frequency of Social Gatherings with Friends and Family: More than three times a week  . Attends Religious Services: More than 4 times per year  . Active Member of Clubs or Organizations: Yes  . Attends Archivist Meetings: More than 4 times per year  . Marital Status:  Widowed  Intimate Partner Violence: Not At Risk  . Fear of Current or Ex-Partner: No  . Emotionally Abused: No  . Physically Abused: No  . Sexually Abused: No    Outpatient Encounter Medications as of 05/15/2019  Medication Sig  . aspirin EC 81 MG tablet Take 81 mg by mouth daily.  . Calcium 500-125 MG-UNIT TABS 1 tablet. 1 tablet daily.  . Cholecalciferol (VITAMIN D3) 2000 units capsule 1 capsule.  . meloxicam (MOBIC) 15 MG tablet Take 1 tablet (15 mg total) by mouth daily.  Marland Kitchen REPATHA SURECLICK 076 MG/ML SOAJ INJECT 1 DOSE SUBCUTANEOUSLY EVERY 14 DAYS  . rosuvastatin (CRESTOR) 5 MG  tablet TAKE 1/2 (ONE-HALF) TABLET BY MOUTH THREE TIMES A WEEK  . [DISCONTINUED] Multiple Vitamins tablet 1 tablet. 1 tablet daily.   No facility-administered encounter medications on file as of 05/15/2019.    Allergies  Allergen Reactions  . Penicillins Hives  . Sulfa Antibiotics   . Sulfamethoxazole Itching  . Lipitor [Atorvastatin] Diarrhea  . Crestor [Rosuvastatin Calcium] Other (See Comments)    Myalgias     Review of Systems  Constitutional: Negative for activity change, appetite change, chills, diaphoresis, fatigue, fever and unexpected weight change.  HENT: Positive for hearing loss.   Eyes: Negative.  Negative for photophobia and visual disturbance.  Respiratory: Negative for cough, chest tightness and shortness of breath.   Cardiovascular: Negative for chest pain, palpitations and leg swelling.  Gastrointestinal: Negative for abdominal pain, blood in stool, constipation, diarrhea, nausea and vomiting.  Endocrine: Negative.  Negative for cold intolerance, heat intolerance, polydipsia, polyphagia and polyuria.  Genitourinary: Negative for decreased urine volume, dysuria, frequency and urgency.  Musculoskeletal: Positive for arthralgias and myalgias.  Skin: Negative.   Allergic/Immunologic: Negative.   Neurological: Negative for dizziness, tremors, seizures, syncope, facial asymmetry, speech difficulty, weakness, light-headedness, numbness and headaches.  Hematological: Negative.   Psychiatric/Behavioral: Negative for confusion, hallucinations, sleep disturbance and suicidal ideas.  All other systems reviewed and are negative.       Objective:  BP 118/60 (BP Location: Left Arm, Cuff Size: Large)   Pulse 72   Temp 98.4 F (36.9 C)   Resp 20   Ht '5\' 6"'$  (1.676 m)   Wt 184 lb (83.5 kg)   SpO2 98%   BMI 29.70 kg/m    Wt Readings from Last 3 Encounters:  05/15/19 184 lb (83.5 kg)  11/28/18 182 lb (82.6 kg)  11/13/18 182 lb (82.6 kg)    Physical Exam Vitals and  nursing note reviewed.  Constitutional:      General: She is not in acute distress.    Appearance: Normal appearance. She is well-developed and well-groomed. She is not ill-appearing, toxic-appearing or diaphoretic.  HENT:     Head: Normocephalic and atraumatic.     Jaw: There is normal jaw occlusion.     Right Ear: Decreased hearing noted.     Left Ear: Decreased hearing noted.     Nose: Nose normal.     Mouth/Throat:     Lips: Pink.     Mouth: Mucous membranes are moist.     Pharynx: Oropharynx is clear. Uvula midline.  Eyes:     General: Lids are normal.     Extraocular Movements: Extraocular movements intact.     Conjunctiva/sclera: Conjunctivae normal.     Pupils: Pupils are equal, round, and reactive to light.     Comments: Left eye ptosis  Neck:     Thyroid: No thyroid mass, thyromegaly or thyroid  tenderness.     Vascular: Carotid bruit (bilateral) present. No JVD.     Trachea: Trachea and phonation normal.  Cardiovascular:     Rate and Rhythm: Normal rate and regular rhythm.     Chest Wall: PMI is not displaced.     Pulses: Normal pulses.     Heart sounds: Murmur present. Systolic murmur present with a grade of 2/6. No friction rub. No gallop.   Pulmonary:     Effort: Pulmonary effort is normal. No respiratory distress.     Breath sounds: Normal breath sounds. No wheezing.  Abdominal:     General: Bowel sounds are normal. There is no distension or abdominal bruit.     Palpations: Abdomen is soft. There is no hepatomegaly or splenomegaly.     Tenderness: There is no abdominal tenderness. There is no right CVA tenderness or left CVA tenderness.     Hernia: No hernia is present.  Musculoskeletal:        General: Normal range of motion.     Cervical back: Normal range of motion and neck supple.     Right lower leg: No edema.     Left lower leg: No edema.  Lymphadenopathy:     Cervical: No cervical adenopathy.  Skin:    General: Skin is warm and dry.     Capillary  Refill: Capillary refill takes less than 2 seconds.     Coloration: Skin is not cyanotic, jaundiced or pale.     Findings: No rash.  Neurological:     General: No focal deficit present.     Mental Status: She is alert and oriented to person, place, and time.     Cranial Nerves: Cranial nerves are intact. No cranial nerve deficit.     Sensory: Sensation is intact. No sensory deficit.     Motor: Motor function is intact. No weakness.     Coordination: Coordination is intact. Coordination normal.     Gait: Gait is intact. Gait normal.     Deep Tendon Reflexes: Reflexes are normal and symmetric. Reflexes normal.  Psychiatric:        Attention and Perception: Attention and perception normal.        Mood and Affect: Mood and affect normal.        Speech: Speech normal.        Behavior: Behavior normal. Behavior is cooperative.        Thought Content: Thought content normal.        Cognition and Memory: Cognition and memory normal.        Judgment: Judgment normal.     Results for orders placed or performed in visit on 05/06/19  CMP14+EGFR  Result Value Ref Range   Glucose 101 (H) 65 - 99 mg/dL   BUN 16 8 - 27 mg/dL   Creatinine, Ser 0.99 0.57 - 1.00 mg/dL   GFR calc non Af Amer 56 (L) >59 mL/min/1.73   GFR calc Af Amer 64 >59 mL/min/1.73   BUN/Creatinine Ratio 16 12 - 28   Sodium 145 (H) 134 - 144 mmol/L   Potassium 4.6 3.5 - 5.2 mmol/L   Chloride 107 (H) 96 - 106 mmol/L   CO2 25 20 - 29 mmol/L   Calcium 9.7 8.7 - 10.3 mg/dL   Total Protein 7.1 6.0 - 8.5 g/dL   Albumin 4.3 3.7 - 4.7 g/dL   Globulin, Total 2.8 1.5 - 4.5 g/dL   Albumin/Globulin Ratio 1.5 1.2 - 2.2   Bilirubin Total  0.9 0.0 - 1.2 mg/dL   Alkaline Phosphatase 77 39 - 117 IU/L   AST 22 0 - 40 IU/L   ALT 18 0 - 32 IU/L  CBC with Differential/Platelet  Result Value Ref Range   WBC 6.3 3.4 - 10.8 x10E3/uL   RBC 4.76 3.77 - 5.28 x10E6/uL   Hemoglobin 14.2 11.1 - 15.9 g/dL   Hematocrit 44.3 34.0 - 46.6 %   MCV 93  79 - 97 fL   MCH 29.8 26.6 - 33.0 pg   MCHC 32.1 31.5 - 35.7 g/dL   RDW 12.9 11.7 - 15.4 %   Platelets 191 150 - 450 x10E3/uL   Neutrophils 51 Not Estab. %   Lymphs 37 Not Estab. %   Monocytes 8 Not Estab. %   Eos 3 Not Estab. %   Basos 1 Not Estab. %   Neutrophils Absolute 3.2 1.4 - 7.0 x10E3/uL   Lymphocytes Absolute 2.3 0.7 - 3.1 x10E3/uL   Monocytes Absolute 0.5 0.1 - 0.9 x10E3/uL   EOS (ABSOLUTE) 0.2 0.0 - 0.4 x10E3/uL   Basophils Absolute 0.0 0.0 - 0.2 x10E3/uL   Immature Granulocytes 0 Not Estab. %   Immature Grans (Abs) 0.0 0.0 - 0.1 x10E3/uL  Lipid panel  Result Value Ref Range   Cholesterol, Total 122 100 - 199 mg/dL   Triglycerides 185 (H) 0 - 149 mg/dL   HDL 55 >39 mg/dL   VLDL Cholesterol Cal 30 5 - 40 mg/dL   LDL Chol Calc (NIH) 37 0 - 99 mg/dL   Chol/HDL Ratio 2.2 0.0 - 4.4 ratio  VITAMIN D 25 Hydroxy (Vit-D Deficiency, Fractures)  Result Value Ref Range   Vit D, 25-Hydroxy 34.6 30.0 - 100.0 ng/mL       Pertinent labs & imaging results that were available during my care of the patient were reviewed by me and considered in my medical decision making.  Assessment & Plan:  Alichia was seen today for medical management of chronic issues and hyperlipidemia.  Diagnoses and all orders for this visit:  Osteopenia of multiple sites Continue calcium and vit D repletion along with weight bearing exercises. Will have DEXA today.  -     DG [facility] DEXA  Hyperlipidemia LDL goal <70 Diet encouraged - increase intake of fresh fruits and vegetables, increase intake of lean proteins. Bake, broil, or grill foods. Avoid fried, greasy, and fatty foods. Avoid fast foods. Increase intake of fiber-rich whole grains. Exercise encouraged - at least 150 minutes per week and advance as tolerated.  Goal BMI < 25. Continue medications as prescribed. Follow up in 3-6 months as discussed.   BMI 29.0-29.9,adult Diet and exercise encouraged.   Bilateral carotid artery stenosis Follow up in  May for repeat carotid US. Continue statin and Repatha as prescribed. Diet and exercise encouraged.     Continue all other maintenance medications.  Follow up plan: Return in about 6 months (around 11/15/2019), or if symptoms worsen or fail to improve.  Continue healthy lifestyle choices, including diet (rich in fruits, vegetables, and lean proteins, and low in salt and simple carbohydrates) and exercise (at least 30 minutes of moderate physical activity daily).  Educational handout given for health maintenance   The above assessment and management plan was discussed with the patient. The patient verbalized understanding of and has agreed to the management plan. Patient is aware to call the clinic if they develop any new symptoms or if symptoms persist or worsen. Patient is aware when to return to  the clinic for a follow-up visit. Patient educated on when it is appropriate to go to the emergency department.   Monia Pouch, FNP-C [facility] [phone]

## 2019-05-26 ENCOUNTER — Telehealth: Payer: Self-pay | Admitting: Podiatry

## 2019-05-26 NOTE — Telephone Encounter (Signed)
I spoke with pt and she asked if she could take the mobic without the injection. I told pt she could it may give her some relief until seen. Pt states she is going to perform ice massage with a frozen bottle and I told her that I felt placing ice pack on the floor with a light towel covering actually gave more of the ice antiinflammatory treatment than the massage but could perform the ice massage after.

## 2019-05-26 NOTE — Telephone Encounter (Signed)
Patient wanted to speak with Nurse about taking Meloxicam. She wanted to schedule a appt. After speaking with Nurse.

## 2019-06-13 ENCOUNTER — Ambulatory Visit (INDEPENDENT_AMBULATORY_CARE_PROVIDER_SITE_OTHER): Payer: Medicare Other | Admitting: Podiatry

## 2019-06-13 ENCOUNTER — Encounter: Payer: Self-pay | Admitting: Podiatry

## 2019-06-13 ENCOUNTER — Other Ambulatory Visit: Payer: Self-pay

## 2019-06-13 ENCOUNTER — Ambulatory Visit (INDEPENDENT_AMBULATORY_CARE_PROVIDER_SITE_OTHER): Payer: Medicare Other

## 2019-06-13 DIAGNOSIS — M722 Plantar fascial fibromatosis: Secondary | ICD-10-CM

## 2019-06-13 DIAGNOSIS — M7731 Calcaneal spur, right foot: Secondary | ICD-10-CM

## 2019-06-13 NOTE — Progress Notes (Signed)
Subjective:  Patient ID: Carol [name], female    DOB: 28-Jun-1945,  MRN: [id number]  Chief Complaint  Patient presents with  . Foot Pain    pt is here for right pain, pt states that pain is elevated when walking on it, pt puts pain on a scale of 3 out of 10 when applying pressure.    74 y.o. female presents with the above complaint.  Patient presents with right heel pain.  Patient was previously treated by Dr. Milinda Pointer who had given given her multiple injections the right heel which does help her.  Last injection was given last year.  She presents today with continuous or recurrence of the right heel pain/plantar fasciitis.  She has done her stretching exercises.  She has a boot from last time that she had placed herself in to help decrease the pain.  She states is constantly keeps coming back.  The pain scale is 3 when walking on it.  She denies any other acute complaints.  Would like to know if she can get another injection and discuss further treatment options.   Review of Systems: Negative except as noted in the HPI. Denies N/V/F/Ch.  Past Medical History:  Diagnosis Date  . Carotid artery stenosis   . Cataract   . Diverticulosis   . Fractured hand    right  . Hyperlipidemia   . Osteopenia   . Precancerous skin lesion 2016    Current Outpatient Medications:  .  aspirin EC 81 MG tablet, Take 81 mg by mouth daily., Disp: , Rfl:  .  Calcium 500-125 MG-UNIT TABS, 1 tablet. 1 tablet daily., Disp: , Rfl:  .  Cholecalciferol (VITAMIN D3) 2000 units capsule, 1 capsule., Disp: , Rfl:  .  meloxicam (MOBIC) 15 MG tablet, Take 1 tablet (15 mg total) by mouth daily., Disp: 30 tablet, Rfl: 3 .  REPATHA SURECLICK XX123456 MG/ML SOAJ, INJECT 1 DOSE SUBCUTANEOUSLY EVERY 14 DAYS, Disp: 6 mL, Rfl: 3 .  rosuvastatin (CRESTOR) 5 MG tablet, TAKE 1/2 (ONE-HALF) TABLET BY MOUTH THREE TIMES A WEEK, Disp: 18 tablet, Rfl: 0  Social History   Tobacco Use  Smoking Status Never Smoker  Smokeless Tobacco Never  Used    Allergies  Allergen Reactions  . Penicillins Hives  . Sulfa Antibiotics   . Sulfamethoxazole Itching  . Lipitor [Atorvastatin] Diarrhea  . Crestor [Rosuvastatin Calcium] Other (See Comments)    Myalgias    Objective:  There were no vitals filed for this visit. There is no height or weight on file to calculate BMI. Constitutional Well developed. Well nourished.  Vascular Dorsalis pedis pulses palpable bilaterally. Posterior tibial pulses palpable bilaterally. Capillary refill normal to all digits.  No cyanosis or clubbing noted. Pedal hair growth normal.  Neurologic Normal speech. Oriented to person, place, and time. Epicritic sensation to light touch grossly present bilaterally.  Dermatologic Nails well groomed and normal in appearance. No open wounds. No skin lesions.  Orthopedic: Normal joint ROM without pain or crepitus bilaterally. No visible deformities. Tender to palpation at the calcaneal tuber right. No pain with calcaneal squeeze right. Ankle ROM full range of motion right. Silfverskiold Test: negative right.   Radiographs: Taken and reviewed. No acute fractures or dislocations. No evidence of stress fracture.  Plantar heel spur present. Posterior heel spur absent.   Assessment:   1. Plantar fasciitis, right    Plan:  Patient was evaluated and treated and all questions answered.  Plantar Fasciitis, right~recurrence - XR reviewed as above.  -  Educated on icing and stretching. Instructions given.  - Injection delivered to the plantar fascia as below. - DME: Plantar Fascial Brace - Pharmacologic management: None. Educated on risks/benefits and proper taking of medication.  Procedure: Injection Tendon/Ligament Location: Right plantar fascia at the glabrous junction; medial approach. Skin Prep: alcohol Injectate: 0.5 cc 0.5% marcaine plain, 0.5 cc of 1% Lidocaine, 0.5 cc kenalog 10. Disposition: Patient tolerated procedure well. Injection site  dressed with a band-aid.  No follow-ups on file.

## 2019-06-30 ENCOUNTER — Encounter: Payer: Self-pay | Admitting: Family Medicine

## 2019-06-30 ENCOUNTER — Other Ambulatory Visit: Payer: Self-pay

## 2019-06-30 ENCOUNTER — Ambulatory Visit (INDEPENDENT_AMBULATORY_CARE_PROVIDER_SITE_OTHER): Payer: Medicare Other | Admitting: Family Medicine

## 2019-06-30 VITALS — HR 60 | Temp 97.3°F | Ht 66.0 in | Wt 184.0 lb

## 2019-06-30 DIAGNOSIS — D485 Neoplasm of uncertain behavior of skin: Secondary | ICD-10-CM | POA: Diagnosis not present

## 2019-06-30 DIAGNOSIS — C4492 Squamous cell carcinoma of skin, unspecified: Secondary | ICD-10-CM | POA: Diagnosis not present

## 2019-06-30 DIAGNOSIS — L859 Epidermal thickening, unspecified: Secondary | ICD-10-CM | POA: Diagnosis not present

## 2019-06-30 NOTE — Progress Notes (Signed)
Pulse 60   Temp (!) 97.3 F (36.3 C)   Ht 5\' 6"  (1.676 m)   Wt 83.5 kg   SpO2 98%   BMI 29.70 kg/m    Subjective:   Patient ID: Carol [name], female    DOB: [birth year], 74 y.o.   MRN: [id number]  HPI: Carol [name] is a 74 y.o. female presenting on 06/30/2019 for Actinic Keratosis (right chest)  Ms. Fornash reports that she has noticed this skin spot for a little over a month. She says that she scratched the spot off but it came right back. She describes it as very hard and it reminds her of a plantar's wart that she had as a child. She says that the lesion is not itchy or painful and it does not bleed. She says it has not grown bigger. She has had several suspicious skin lesions removed in the past.  The spot is on her right upper breast  Relevant past medical, surgical, family and social history reviewed and updated as indicated. Interim medical history since our last visit reviewed.  Allergies and medications reviewed and updated.  Review of Systems  Constitutional: Negative for chills and fever.  Skin: Positive for color change. Negative for rash and wound.    Per HPI unless specifically indicated above   Allergies as of 06/30/2019      Reactions   Penicillins Hives   Sulfa Antibiotics    Sulfamethoxazole Itching   Lipitor [atorvastatin] Diarrhea   Crestor [rosuvastatin Calcium] Other (See Comments)   Myalgias      Medication List       Accurate as of Jun 30, 2019 10:41 AM. If you have any questions, ask your nurse or doctor.        aspirin EC 81 MG tablet Take 81 mg by mouth daily.   Calcium 500-125 MG-UNIT Tabs 1 tablet. 1 tablet daily.   meloxicam 15 MG tablet Commonly known as: MOBIC Take 1 tablet (15 mg total) by mouth daily.   Repatha SureClick XX123456 MG/ML Soaj Generic drug: Evolocumab INJECT 1 DOSE SUBCUTANEOUSLY EVERY 14 DAYS   rosuvastatin 5 MG tablet Commonly known as: CRESTOR TAKE 1/2 (ONE-HALF) TABLET BY MOUTH THREE TIMES A WEEK     Vitamin D3 50 MCG (2000 UT) capsule 1 capsule.        Objective:   Pulse 60   Temp (!) 97.3 F (36.3 C)   Ht 5\' 6"  (1.676 m)   Wt 83.5 kg   SpO2 98%   BMI 29.70 kg/m   Wt Readings from Last 3 Encounters:  06/30/19 83.5 kg  05/15/19 83.5 kg  11/28/18 82.6 kg    Physical Exam Constitutional:      General: She is not in acute distress.    Appearance: Normal appearance.  Cardiovascular:     Rate and Rhythm: Normal rate and regular rhythm.     Comments: S1/S2 auscultated. Pulmonary:     Effort: Pulmonary effort is normal.     Breath sounds: Normal breath sounds. No wheezing, rhonchi or rales.  Skin:    General: Skin is warm and dry.     Findings: Lesion (0.25 round, raised lesion with erythematous base and hyperkeratosis on the top of the lesion located on the right breast.) present.  Neurological:     Mental Status: She is alert and oriented to person, place, and time.     Coordination: Coordination normal.     Gait: Gait normal.  Psychiatric:  Mood and Affect: Mood normal.        Behavior: Behavior normal.        Thought Content: Thought content normal.        Judgment: Judgment normal.    Skin excision  Date/Time: 06/30/2019 11:08 AM Performed by: Tayson Schnelle, Fransisca Kaufmann, MD Authorized by: Lenette Rau, Fransisca Kaufmann, MD   Number of Lesions: 1 Lesion 1:    Body area: trunk   Trunk location: R breast   Initial size (mm): 2.5   Final defect size (mm): 2.5   Malignancy: malignancy unknown     Destruction method comment: Shave biopsy  Comments:  2.5 mm lesion suspicious of Squamous Cell Carcinoma was removed from patient's right breast using shave biopsy technique after localized numbing with 2% Lido without Epi. Post-excision hemostasis was achieved with one silver nitrate stick. Triple antibiotic ointment was applied to the excision site and the site was bandaged with clean gauze and surgical tape. Patient tolerated the procedure well and there were no acute  complications.   Skin lesion removal: Verbal consent was obtained.  Betadine was used for cleansing.  2cc of 2% lidocaine without epinephrine was used for anesthesia.  Shave biopsy was performed with good margins.  Curettage was performed and then silver nitrate was used to achieve hemostasis.  Topical antibiotic was used and then it was covered by 3x 3 and bandage told in place. Procedure was tolerated well.  Bleeding was minimal  Assessment & Plan:   Problem List Items Addressed This Visit    None    Visit Diagnoses    Squamous cell skin cancer    -  Primary   Relevant Orders   Anatomic Pathology Report      Sent for pathology but based on exam looks like a squamous cell carcinoma. Follow up plan: Return if symptoms worsen or fail to improve.  Ms. Zinter was informed that the results of the biopsy would be back in about a week. Ms. Lafontant will be informed about the results of the biopsy when they come back. No follow up appointment or referral to Dermatology needed at this time.  Gaynelle Arabian, PA-[facility] 06/30/2019, 10:41 AM   Patient seen and examined with Gaynelle Arabian, PA student, procedure was performed by Dr. Warrick Parisian with Gaynelle Arabian and Leontine Locket FNP Caryl Pina, MD [place] 06/30/2019, 1:05 PM

## 2019-07-02 ENCOUNTER — Telehealth: Payer: Self-pay | Admitting: Family Medicine

## 2019-07-02 NOTE — Telephone Encounter (Signed)
Advised that results are not back yet and may  take another week or so. Patient verbalized understanding

## 2019-07-03 LAB — ANATOMIC PATHOLOGY REPORT

## 2019-07-15 ENCOUNTER — Other Ambulatory Visit: Payer: Self-pay

## 2019-07-15 ENCOUNTER — Ambulatory Visit (INDEPENDENT_AMBULATORY_CARE_PROVIDER_SITE_OTHER): Payer: Medicare Other | Admitting: Podiatry

## 2019-07-15 ENCOUNTER — Telehealth: Payer: Self-pay | Admitting: Family Medicine

## 2019-07-15 DIAGNOSIS — M722 Plantar fascial fibromatosis: Secondary | ICD-10-CM | POA: Diagnosis not present

## 2019-07-15 NOTE — Telephone Encounter (Signed)
Patient came in office today to discuss notes in her chart regarding her recent breast biopsy.  States that there is a message in the chart stating "kidney lesion" was addressed and this is incorrect.  Wants this corrected in her chart as it was her breast that was addressed where place was removed.

## 2019-07-15 NOTE — Telephone Encounter (Signed)
I do not see anywhere in my note where it talks about a "kidney" lesion, everywhere in my note it says skin lesion breast biopsy skin lesion, I do not see anywhere in my note where it says kidney lesion, it is a skin lesion on the right breast.  I even did a control left find and do not see anywhere in my note where it is a kidney lesion, does not psych kidney anywhere in the note.  I do not know where the kidney is being seen, if you see something I do not then maybe will have to show me. Caryl Pina, MD [facility] 07/15/2019, 9:01 PM

## 2019-07-15 NOTE — Progress Notes (Signed)
She presents today for follow-up of her plantar fasciitis states that the last shot that Dr. Posey Pronto gave her really worked and she is doing really well with that and the plantar fascial brace.  Objective: Vital signs are stable she is alert oriented x3 there is no erythema edema cellulitis drainage odor she has no pain on palpation medial calcaneal tubercle.  Assessment: Resolving plantar fasciitis.  Plan: Follow-up with me on an as-needed basis.

## 2019-07-16 NOTE — Telephone Encounter (Signed)
[employer]  Result Notes and Comments to Patient Comment seen by patient STEPFANIE PLAZOLA on 07/15/2019 10:14 AM EDT  Patient's kidney lesion comes back as an inflamed benign keratosis, not cancerous, nothing to be alarmed about.  These are also known his age spots.  Written by Dettinger, Fransisca Kaufmann, MD on 07/04/2019  7:51 AM EDT

## 2019-07-16 NOTE — Telephone Encounter (Signed)
Yes that is fine go ahead and change.

## 2019-07-23 NOTE — Telephone Encounter (Signed)
Amendment information given to provider to approve and sign.

## 2019-08-11 ENCOUNTER — Other Ambulatory Visit (HOSPITAL_COMMUNITY): Payer: Self-pay | Admitting: Internal Medicine

## 2019-08-11 ENCOUNTER — Ambulatory Visit (HOSPITAL_COMMUNITY)
Admission: RE | Admit: 2019-08-11 | Discharge: 2019-08-11 | Disposition: A | Discharge status: Home / Self Care | Payer: Medicare Other | Source: Ambulatory Visit | Attending: Cardiology | Admitting: Cardiology

## 2019-08-11 ENCOUNTER — Other Ambulatory Visit: Payer: Self-pay

## 2019-08-11 DIAGNOSIS — I6523 Occlusion and stenosis of bilateral carotid arteries: Secondary | ICD-10-CM | POA: Diagnosis not present

## 2019-08-11 DIAGNOSIS — I6521 Occlusion and stenosis of right carotid artery: Secondary | ICD-10-CM

## 2019-08-12 ENCOUNTER — Encounter: Payer: Self-pay | Admitting: Internal Medicine

## 2019-09-03 ENCOUNTER — Other Ambulatory Visit: Payer: Self-pay | Admitting: *Deleted

## 2019-09-03 MED ORDER — ROSUVASTATIN CALCIUM 5 MG PO TABS: ORAL_TABLET | ORAL | 0 refills | Status: DC

## 2019-09-15 ENCOUNTER — Telehealth: Payer: Self-pay | Admitting: Internal Medicine

## 2019-09-15 MED ORDER — REPATHA SURECLICK 140 MG/ML ~~LOC~~ SOAJ: SUBCUTANEOUS | 1 refills | Status: DC

## 2019-09-15 NOTE — Telephone Encounter (Signed)
Pt c/o medication issue:  1. Name of Medication: REPATHA SURECLICK 500 MG/ML SOAJ  2. How are you currently taking this medication (dosage and times per day)? As written  3. Are you having a reaction (difficulty breathing--STAT)? No   4. What is your medication issue? Pt [facility] [phone]) name is Debbrah Alar

## 2019-09-15 NOTE — Telephone Encounter (Signed)
Rx(s) sent to pharmacy electronically.  

## 2019-09-26 ENCOUNTER — Ambulatory Visit: Payer: Medicare Other | Admitting: Podiatry

## 2019-09-26 ENCOUNTER — Emergency Department (HOSPITAL_BASED_OUTPATIENT_CLINIC_OR_DEPARTMENT_OTHER): Payer: Medicare Other

## 2019-09-26 ENCOUNTER — Ambulatory Visit (INDEPENDENT_AMBULATORY_CARE_PROVIDER_SITE_OTHER): Payer: Medicare Other | Admitting: Podiatry

## 2019-09-26 ENCOUNTER — Encounter (HOSPITAL_COMMUNITY): Payer: Self-pay

## 2019-09-26 ENCOUNTER — Emergency Department (HOSPITAL_COMMUNITY)
Admission: EM | Admit: 2019-09-26 | Discharge: 2019-09-26 | Disposition: A | Discharge status: Home / Self Care | Payer: Medicare Other | Attending: Emergency Medicine | Admitting: Emergency Medicine

## 2019-09-26 ENCOUNTER — Ambulatory Visit (INDEPENDENT_AMBULATORY_CARE_PROVIDER_SITE_OTHER): Payer: Medicare Other

## 2019-09-26 ENCOUNTER — Telehealth: Payer: Self-pay

## 2019-09-26 ENCOUNTER — Other Ambulatory Visit: Payer: Self-pay

## 2019-09-26 DIAGNOSIS — M722 Plantar fascial fibromatosis: Secondary | ICD-10-CM | POA: Diagnosis not present

## 2019-09-26 DIAGNOSIS — Z7982 Long term (current) use of aspirin: Secondary | ICD-10-CM | POA: Insufficient documentation

## 2019-09-26 DIAGNOSIS — H524 Presbyopia: Secondary | ICD-10-CM | POA: Diagnosis not present

## 2019-09-26 DIAGNOSIS — M79662 Pain in left lower leg: Secondary | ICD-10-CM | POA: Diagnosis not present

## 2019-09-26 DIAGNOSIS — M79605 Pain in left leg: Secondary | ICD-10-CM

## 2019-09-26 DIAGNOSIS — M79609 Pain in unspecified limb: Secondary | ICD-10-CM | POA: Diagnosis not present

## 2019-09-26 NOTE — Telephone Encounter (Signed)
Pt called from [facility] ED waiting for several hours to get an u/s of leg for calf pain. She has not been seen in our office before and wanted to try to get in immediately. She has been advised to stay and be seen there.

## 2019-09-26 NOTE — ED Triage Notes (Signed)
Patient states she has been having bilateral leg swelling since yesterday. Patient went to her physician and had tenderness to the left calf. Patient states she was in a car yesterday for approx 8+ hours.

## 2019-09-26 NOTE — ED Provider Notes (Signed)
[facility] DEPT Provider Note   CSN: [id number] Arrival date & time: 09/26/19  1147     History Chief Complaint  Patient presents with  . Leg Swelling    Carol [name] is a 74 y.o. female with PMH/o diverticulosis, carotid artery stenosis, hyperlipidemia who presents for evaluation of left lower extremity pain, swelling that has been ongoing for last 2 days.  She reports that she recently drove about 8 hours in a car.  She states that she started noticing some pain in her left lower extremity and felt like her ankle was swollen.  She states that she had some pain in her left calf.  No overlying warmth, erythema.  No trauma, injury, fall.  She reports that she went to the podiatrist earlier today and was sent to the ED for evaluation of DVT.  She denies any OCP use, recent immobilization, prior history of DVT/PE, recent surgery, leg swelling.  She denies any fevers, CP, SOB, numbness/weakness.   The history is provided by the patient.       Past Medical History:  Diagnosis Date  . Carotid artery stenosis   . Cataract   . Diverticulosis   . Fractured hand    right  . Hyperlipidemia   . Osteopenia   . Precancerous skin lesion 2016    Patient Active Problem List   Diagnosis Date Noted  . BMI 29.0-29.9,adult 05/15/2019  . Plantar fasciitis of right foot 11/13/2018  . Bilateral carotid artery stenosis 07/27/2017  . Murmur 07/26/2016  . Carotid stenosis 07/22/2014  . Family history of heart disease 07/28/2013  . Need for prophylactic vaccination and inoculation against influenza 11/26/2012  . Diverticulosis   . Osteopenia 09/11/2012  . Hyperlipidemia LDL goal <70 07/23/2012  . H/O infectious disease 01/02/2012  . Pseudoptosis 11/07/2011    Past Surgical History:  Procedure Laterality Date  . CATARACT EXTRACTION W/ INTRAOCULAR LENS IMPLANT Bilateral   . CHOLECYSTECTOMY  2009  . EYE SURGERY    . hand fracture surgery       OB History     No obstetric history on file.     Family History  Problem Relation Age of Onset  . Heart disease Mother   . Heart disease Father   . Cancer Sister        breast  . Hyperlipidemia Sister   . Diabetes Sister   . Cancer Sister        bone  . Alzheimer's disease Sister   . Heart disease Brother   . CAD Brother   . Cancer Sister        breast  . Kidney disease Sister   . Heart disease Brother   . Hypertension Brother   . Alzheimer's disease Brother   . Kidney disease Brother        2018 starting dialysis    Social History   Tobacco Use  . Smoking status: Never Smoker  . Smokeless tobacco: Never Used  Vaping Use  . Vaping Use: Never used  Substance Use Topics  . Alcohol use: No  . Drug use: No    Home Medications Prior to Admission medications   Medication Sig Start Date End Date Taking? Authorizing Provider  aspirin EC 81 MG tablet Take 81 mg by mouth daily.    [provider]  Calcium 500-125 MG-UNIT TABS 1 tablet. 1 tablet daily.    [provider]  Cholecalciferol (VITAMIN D3) 2000 units capsule 1 capsule.  [provider]  Evolocumab (REPATHA SURECLICK) 841 MG/ML SOAJ INJECT 1 DOSE SUBCUTANEOUSLY EVERY 14 DAYS 09/15/19   Hilty, Nadean Corwin, MD  meloxicam (MOBIC) 15 MG tablet Take 1 tablet (15 mg total) by mouth daily. 11/13/18   Baruch Gouty, FNP  rosuvastatin (CRESTOR) 5 MG tablet TAKE 1/2 (ONE-HALF) TABLET BY MOUTH THREE TIMES A WEEK 09/03/19   Dettinger, Fransisca Kaufmann, MD    Allergies    Penicillins, Sulfa antibiotics, Sulfamethoxazole, Lipitor [atorvastatin], and Crestor [rosuvastatin calcium]  Review of Systems   Review of Systems  Constitutional: Negative for fever.  Respiratory: Negative for cough and shortness of breath.   Cardiovascular: Positive for leg swelling. Negative for chest pain.  Gastrointestinal: Negative for abdominal pain, nausea and vomiting.  Musculoskeletal:       Leg pain  Neurological: Negative for weakness  and numbness.  All other systems reviewed and are negative.   Physical Exam Updated Vital Signs BP (!) 165/81 (BP Location: Left Arm)   Pulse 60   Temp 98.7 F (37.1 C)   Resp 16   Ht 5\' 6"  (1.676 m)   Wt 82.6 kg   SpO2 100%   BMI 29.38 kg/m   Physical Exam Vitals and nursing note reviewed.  Constitutional:      Appearance: Normal appearance. She is well-developed.  HENT:     Head: Normocephalic and atraumatic.  Eyes:     General: Lids are normal.     Conjunctiva/sclera: Conjunctivae normal.     Pupils: Pupils are equal, round, and reactive to light.  Cardiovascular:     Rate and Rhythm: Normal rate and regular rhythm.     Pulses: Normal pulses.          Dorsalis pedis pulses are 2+ on the right side and 2+ on the left side.     Heart sounds: Normal heart sounds. No murmur heard.  No friction rub. No gallop.   Pulmonary:     Effort: Pulmonary effort is normal.     Breath sounds: Normal breath sounds.     Comments: Lungs clear to auscultation bilaterally.  Symmetric chest rise.  No wheezing, rales, rhonchi. Abdominal:     Palpations: Abdomen is soft. Abdomen is not rigid.     Tenderness: There is no abdominal tenderness. There is no guarding.  Musculoskeletal:        General: Normal range of motion.     Cervical back: Full passive range of motion without pain.     Comments: Mild tenderness noted to the left lower extremity at the left calf.  No overlying warmth, erythema, edema.  Bilateral lower extremities.  Symmetric in appearance.  Skin:    General: Skin is warm and dry.     Capillary Refill: Capillary refill takes less than 2 seconds.  Neurological:     Mental Status: She is alert and oriented to person, place, and time.     Comments: Sensation intact along major nerve distributions of BLE  Psychiatric:        Speech: Speech normal.     ED Results / Procedures / Treatments   Labs (all labs ordered are listed, but only abnormal results are displayed) Labs  Reviewed - No data to display  EKG None  Radiology VAS [place] LOWER EXTREMITY VENOUS (DVT) ([facility] and [facility] 7a-7p)  Result Date: 09/26/2019  Lower Venous DVTStudy Indications: Pain.  Comparison Study: no prior Performing Technologist: Abram Sander RVS  Examination Guidelines: A complete evaluation includes B-mode imaging, spectral Doppler,  color Doppler, and power Doppler as needed of all accessible portions of each vessel. Bilateral testing is considered an integral part of a complete examination. Limited examinations for reoccurring indications may be performed as noted. The reflux portion of the exam is performed with the patient in reverse Trendelenburg.  +-----+---------------+---------+-----------+----------+--------------+ RIGHTCompressibilityPhasicitySpontaneityPropertiesThrombus Aging +-----+---------------+---------+-----------+----------+--------------+ CFV  Full           Yes      Yes                                 +-----+---------------+---------+-----------+----------+--------------+   +---------+---------------+---------+-----------+----------+--------------+ LEFT     CompressibilityPhasicitySpontaneityPropertiesThrombus Aging +---------+---------------+---------+-----------+----------+--------------+ CFV      Full           Yes      Yes                                 +---------+---------------+---------+-----------+----------+--------------+ SFJ      Full                                                        +---------+---------------+---------+-----------+----------+--------------+ FV Prox  Full                                                        +---------+---------------+---------+-----------+----------+--------------+ FV Mid   Full                                                        +---------+---------------+---------+-----------+----------+--------------+ FV DistalFull                                                         +---------+---------------+---------+-----------+----------+--------------+ PFV      Full                                                        +---------+---------------+---------+-----------+----------+--------------+ POP      Full           Yes      Yes                                 +---------+---------------+---------+-----------+----------+--------------+ PTV      Full                                                        +---------+---------------+---------+-----------+----------+--------------+  PERO     Full                                                        +---------+---------------+---------+-----------+----------+--------------+     Summary: RIGHT: - No evidence of common femoral vein obstruction.  LEFT: - There is no evidence of deep vein thrombosis in the lower extremity.  - No cystic structure found in the popliteal fossa.  *See table(s) above for measurements and observations.    Preliminary     Procedures Procedures (including critical care time)  Medications Ordered in ED Medications - No data to display  ED Course  I have reviewed the triage vital signs and the nursing notes.  Pertinent labs & imaging results that were available during my care of the patient were reviewed by me and considered in my medical decision making (see chart for details).    MDM Rules/Calculators/A&P                          74 year old female who presents for evaluation of left lower extremity pain, swelling has been ongoing for last 2 days.  Recent 8-hour travel via vehicle.  She saw her podiatrist and was sent over to the ED for further evaluation to ensure she did not have a DVT.  No DVT risk factors other than travel.  No numbness/weakness.  On initial ED arrival, she is afebrile nontoxic-appearing.  Vital signs are stable.  She is neurovascular intact.  Patient with some mild tenderness in the left calf.  No overlying warmth, erythema, edema.  Consider muscle  strain.  Also consider DVT given recent travel.  History/physical exam concerning for cellulitis/infectious etiology or ischemic limb.  Will plan for ultrasound.  Ultrasound reviewed.  Negative for DVT.  Discussed results with patient.  Encouraged at home supportive care measures. At this time, patient exhibits no emergent life-threatening condition that require further evaluation in ED or admission. Patient had ample opportunity for questions and discussion. All patient's questions were answered with full understanding. Strict return precautions discussed. Patient expresses understanding and agreement to plan.   Portions of this note were generated with [employer]. Dictation errors may occur despite best attempts at proofreading.  Final Clinical Impression(s) / ED Diagnoses Final diagnoses:  Pain of left lower extremity    Rx / DC Orders ED Discharge Orders    None       Volanda Napoleon, PA-C 09/26/19 [place], [place], DO 09/26/19 1644

## 2019-09-26 NOTE — Progress Notes (Signed)
Lower extremity venous has been completed.   Preliminary results in CV Proc.   Carol [name] 09/26/2019 4:28 PM

## 2019-09-26 NOTE — Discharge Instructions (Signed)
Your ultrasound was negative for a blood clot.   You can take 1000 mg of Tylenol.  Do not exceed 4000 mg of Tylenol a day.  Follow up with your foot doctor.   Return to the Emergency Dept for any worsening pain in your leg, redness or swelling, numbness/weakness or any other worsening or concerning symptoims.

## 2019-09-26 NOTE — ED Notes (Signed)
All care rendered by provider. 

## 2019-09-30 ENCOUNTER — Encounter: Payer: Self-pay | Admitting: Podiatry
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
## 2019-11-06 ENCOUNTER — Other Ambulatory Visit: Payer: Self-pay

## 2019-11-06 ENCOUNTER — Other Ambulatory Visit: Payer: Self-pay | Admitting: *Deleted

## 2019-11-06 ENCOUNTER — Other Ambulatory Visit: Payer: Medicare Other

## 2019-11-06 DIAGNOSIS — E785 Hyperlipidemia, unspecified: Secondary | ICD-10-CM

## 2019-11-06 DIAGNOSIS — H903 Sensorineural hearing loss, bilateral: Secondary | ICD-10-CM | POA: Diagnosis not present

## 2019-11-06 DIAGNOSIS — Z6829 Body mass index (BMI) 29.0-29.9, adult: Secondary | ICD-10-CM

## 2019-11-07 LAB — CBC WITH DIFFERENTIAL/PLATELET
Basophils Absolute: 0 10*3/uL (ref 0.0–0.2)
Basos: 1 %
EOS (ABSOLUTE): 0.3 10*3/uL (ref 0.0–0.4)
Eos: 4 %
Hematocrit: 43.1 % (ref 34.0–46.6)
Hemoglobin: 14.3 g/dL (ref 11.1–15.9)
Immature Grans (Abs): 0 10*3/uL (ref 0.0–0.1)
Immature Granulocytes: 0 %
Lymphocytes Absolute: 2.7 10*3/uL (ref 0.7–3.1)
Lymphs: 38 %
MCH: 30.7 pg (ref 26.6–33.0)
MCHC: 33.2 g/dL (ref 31.5–35.7)
MCV: 93 fL (ref 79–97)
Monocytes Absolute: 0.5 10*3/uL (ref 0.1–0.9)
Monocytes: 7 %
Neutrophils Absolute: 3.5 10*3/uL (ref 1.4–7.0)
Neutrophils: 50 %
Platelets: 191 10*3/uL (ref 150–450)
RBC: 4.66 x10E6/uL (ref 3.77–5.28)
RDW: 12.9 % (ref 11.7–15.4)
WBC: 7 10*3/uL (ref 3.4–10.8)

## 2019-11-07 LAB — LIPID PANEL
Chol/HDL Ratio: 4.3 ratio (ref 0.0–4.4)
Cholesterol, Total: 166 mg/dL (ref 100–199)
HDL: 39 mg/dL — ABNORMAL LOW (ref >39)
LDL Chol Calc (NIH): 37 mg/dL (ref 0–99)
Triglycerides: 654 mg/dL (ref 0–149)
VLDL Cholesterol Cal: 90 mg/dL — ABNORMAL HIGH (ref 5–40)

## 2019-11-07 LAB — CMP14+EGFR
ALT: 15 IU/L (ref 0–32)
AST: 14 IU/L (ref 0–40)
Albumin/Globulin Ratio: 1.4 (ref 1.2–2.2)
Albumin: 4.2 g/dL (ref 3.7–4.7)
Alkaline Phosphatase: 87 IU/L (ref 48–121)
BUN/Creatinine Ratio: 16 (ref 12–28)
BUN: 14 mg/dL (ref 8–27)
Bilirubin Total: 0.3 mg/dL (ref 0.0–1.2)
CO2: 25 mmol/L (ref 20–29)
Calcium: 9.3 mg/dL (ref 8.7–10.3)
Chloride: 106 mmol/L (ref 96–106)
Creatinine, Ser: 0.89 mg/dL (ref 0.57–1.00)
GFR calc Af Amer: 73 mL/min/{1.73_m2} (ref >59)
GFR calc non Af Amer: 63 mL/min/{1.73_m2} (ref >59)
Globulin, Total: 2.9 g/dL (ref 1.5–4.5)
Glucose: 99 mg/dL (ref 65–99)
Potassium: 4.7 mmol/L (ref 3.5–5.2)
Sodium: 144 mmol/L (ref 134–144)
Total Protein: 7.1 g/dL (ref 6.0–8.5)

## 2019-11-10 ENCOUNTER — Telehealth: Payer: Self-pay | Admitting: Family Medicine

## 2019-11-10 ENCOUNTER — Other Ambulatory Visit: Payer: Self-pay | Admitting: Family Medicine

## 2019-11-10 DIAGNOSIS — Z0120 Encounter for dental examination and cleaning without abnormal findings: Secondary | ICD-10-CM | POA: Diagnosis not present

## 2019-11-10 MED ORDER — OMEGA-3-ACID ETHYL ESTERS 1 G PO CAPS: 2.0000 g | ORAL_CAPSULE | Freq: Two times a day (BID) | ORAL | 1 refills | Status: DC

## 2019-11-10 NOTE — Progress Notes (Signed)
Please let the patient know that I sent Carol [name] for her to the pharmacy that she can try to in the morning 2 in the evening of fish oils.  And she should follow up with Dr. Darnell Level on any future blood work and maybe do some fasting labs in the future, make sure she is taking her Crestor and Repatha as prescribed Caryl Pina, MD [place] 11/10/2019, 12:50 PM

## 2019-11-10 NOTE — Progress Notes (Signed)
Pt came into office concerned about triglycerides. What could cause this huge jump?? She is taking her repatha and crestor. Her sister just had a stroke and she is scared

## 2019-11-10 NOTE — Progress Notes (Signed)
Lmtcb.

## 2019-11-11 ENCOUNTER — Other Ambulatory Visit: Payer: Self-pay | Admitting: *Deleted

## 2019-11-11 MED ORDER — ROSUVASTATIN CALCIUM 5 MG PO TABS: ORAL_TABLET | ORAL | 3 refills | Status: DC

## 2019-11-11 NOTE — Progress Notes (Signed)
Triglycerides can be greatly affected by what she ate that morning or what she ate the night before if she ate a lot of cholesterol laden foods such as sausage and bacon and eggs for the morning and then had a cholesterol laden dinner the night before such as hamburger or cheeseburger or fried steak that could easily pick this up, I do not know what she had before what she did not have but it is likely something she ate in the last 24 hours before she came in.  I would just avoid excessive amounts of those types of foods that have a lot of cholesterol and fat and grease in them. Caryl Pina, MD [facility] 11/11/2019, 8:41 AM

## 2019-11-11 NOTE — Progress Notes (Signed)
Patient aware of recommendation. She needed a refill on crestor. Rx filled.

## 2019-11-17 ENCOUNTER — Ambulatory Visit (INDEPENDENT_AMBULATORY_CARE_PROVIDER_SITE_OTHER): Payer: Medicare Other | Admitting: Family Medicine

## 2019-11-17 ENCOUNTER — Other Ambulatory Visit: Payer: Self-pay

## 2019-11-17 VITALS — BP 139/77 | HR 57 | Temp 97.4°F | Ht 66.0 in | Wt 178.0 lb

## 2019-11-17 DIAGNOSIS — E785 Hyperlipidemia, unspecified: Secondary | ICD-10-CM

## 2019-11-17 DIAGNOSIS — M8589 Other specified disorders of bone density and structure, multiple sites: Secondary | ICD-10-CM | POA: Diagnosis not present

## 2019-11-17 DIAGNOSIS — I6523 Occlusion and stenosis of bilateral carotid arteries: Secondary | ICD-10-CM | POA: Diagnosis not present

## 2019-11-17 NOTE — Progress Notes (Signed)
Subjective: CC: HLD, osteopenia PCP: Dettinger, Fransisca Kaufmann, MD Carol [name] is a 74 y.o. female presenting to clinic today for:  1. HLD Patient had labs done 2 weeks ago and was noted to have TGs >600.  Despite telephone notes, Apparently, she was fasting for labs. She is compliant with Repatha, Fish oil and 3 times weekly crestor.  She sees Dr. Debara Pickett for cardiology.  She notes that her cholesterol was surprisingly under better control with Vytorin and fenofibrate previously but given her carotid stenosis they thought that the [place] may be of more benefit so she was switched.  She does not report any abdominal pain, nausea, vomiting.  2. Ostoepenia Compliant with calcium and vitamin D supplementation.  She had a DEXA scan performed in March which showed normal bone density with T score of -1.   ROS: Per HPI  Allergies  Allergen Reactions  . Penicillins Hives  . Sulfa Antibiotics   . Sulfamethoxazole Itching  . Lipitor [Atorvastatin] Diarrhea  . Crestor [Rosuvastatin Calcium] Other (See Comments)    Myalgias    Past Medical History:  Diagnosis Date  . Carotid artery stenosis   . Cataract   . Diverticulosis   . Fractured hand    right  . Hyperlipidemia   . Osteopenia   . Precancerous skin lesion 2016    Current Outpatient Medications:  .  aspirin EC 81 MG tablet, Take 81 mg by mouth daily., Disp: , Rfl:  .  Calcium 500-125 MG-UNIT TABS, 1 tablet. 1 tablet daily., Disp: , Rfl:  .  Cholecalciferol (VITAMIN D3) 2000 units capsule, 1 capsule., Disp: , Rfl:  .  Evolocumab (REPATHA SURECLICK) 465 MG/ML SOAJ, INJECT 1 DOSE SUBCUTANEOUSLY EVERY 14 DAYS, Disp: 6 mL, Rfl: 1 .  meloxicam (MOBIC) 15 MG tablet, Take 1 tablet (15 mg total) by mouth daily., Disp: 30 tablet, Rfl: 3 .  omega-3 acid ethyl esters (LOVAZA) 1 g capsule, Take 2 capsules (2 g total) by mouth 2 (two) times daily., Disp: 120 capsule, Rfl: 1 .  rosuvastatin (CRESTOR) 5 MG tablet, TAKE 1/2 (ONE-HALF) TABLET  BY MOUTH THREE TIMES A WEEK, Disp: 18 tablet, Rfl: 3 Social History   Socioeconomic History  . Marital status: Widowed    Spouse name: Not on file  . Number of children: 2  . Years of education: 27  . Highest education level: High school graduate  Occupational History  . Occupation: retired  Tobacco Use  . Smoking status: Never Smoker  . Smokeless tobacco: Never Used  Vaping Use  . Vaping Use: Never used  Substance and Sexual Activity  . Alcohol use: No  . Drug use: No  . Sexual activity: Not Currently  Other Topics Concern  . Not on file  Social History Narrative  . Not on file   Social Determinants of Health   Financial Resource Strain: Low Risk   . Difficulty of Paying Living Expenses: Not hard at all  Food Insecurity: No Food Insecurity  . Worried About Charity fundraiser in the Last Year: Never true  . Ran Out of Food in the Last Year: Never true  Transportation Needs: No Transportation Needs  . Lack of Transportation (Medical): No  . Lack of Transportation (Non-Medical): No  Physical Activity: Sufficiently Active  . Days of Exercise per Week: 4 days  . Minutes of Exercise per Session: 90 min  Stress: No Stress Concern Present  . Feeling of Stress : Not at all  Social Connections: Moderately  Integrated  . Frequency of Communication with Friends and Family: More than three times a week  . Frequency of Social Gatherings with Friends and Family: More than three times a week  . Attends Religious Services: More than 4 times per year  . Active Member of Clubs or Organizations: Yes  . Attends Archivist Meetings: More than 4 times per year  . Marital Status: Widowed  Intimate Partner Violence: Not At Risk  . Fear of Current or Ex-Partner: No  . Emotionally Abused: No  . Physically Abused: No  . Sexually Abused: No   Family History  Problem Relation Age of Onset  . Heart disease Mother   . Heart disease Father   . Cancer Sister        breast  .  Hyperlipidemia Sister   . Diabetes Sister   . Cancer Sister        bone  . Alzheimer's disease Sister   . Heart disease Brother   . CAD Brother   . Cancer Sister        breast  . Kidney disease Sister   . Heart disease Brother   . Hypertension Brother   . Alzheimer's disease Brother   . Kidney disease Brother        2018 starting dialysis    Objective: Office vital signs reviewed. BP 139/77   Pulse (!) 57   Temp (!) 97.4 F (36.3 C) (Temporal)   Ht 5\' 6"  (1.676 m)   Wt 178 lb (80.7 kg)   SpO2 97%   BMI 28.73 kg/m   Physical Examination:  General: Awake, alert, well nourished, No acute distress HEENT: Normal; sclera white.  No appreciable carotid bruits on exam Cardio: regular rate and rhythm, S1S2 heard, no murmurs appreciated Pulm: clear to auscultation bilaterally, no wheezes, rhonchi or rales; normal work of breathing on room air Extremities: warm, well perfused, No edema, cyanosis or clubbing; +2 pulses bilaterally MSK: normal gait and station  Assessment/ Plan: 74 y.o. female   1. Hyperlipidemia LDL goal <70 I discussed the possibility of repeating her labs.  However, it does sound like she was truly fasting for the previous lipid panel.  I am going to reach out to Dr. Debara Pickett to pick his brain about potentially either increasing frequency or increasing dose of the Crestor.  The [place] was recently added by my colleague but unsure if she is going to get much bang for her buck given the level of triglycerides.  I have given her handout for low triglyceride diet  2. Osteopenia of multiple sites Continue calcium and vitamin D supplementation  3. Carotid stenosis, bilateral  I reviewed her labs with her today.  I will reach out to her cardiologist as stated above.  No orders of the defined types were placed in this encounter.  No orders of the defined types were placed in this encounter.    Carol Norlander, DO [facility] [phone]

## 2019-11-17 NOTE — Patient Instructions (Signed)
I will reach out to Dr Debara Pickett.  High Triglycerides Eating Plan Triglycerides are a type of fat in the blood. High levels of triglycerides can increase your risk of heart disease and stroke. If your triglyceride levels are high, choosing the right foods can help lower your triglycerides and keep your heart healthy. Work with your health care provider or a diet and nutrition specialist (dietitian) to develop an eating plan that is right for you. What are tips for following this plan? General guidelines   Lose weight, if you are overweight. For most people, losing 5-10 lbs (2-5 kg) helps lower triglyceride levels. A weight-loss plan may include. ? 30 minutes of exercise at least 5 days a week. ? Reducing the amount of calories, sugar, and fat you eat.  Eat a wide variety of fresh fruits, vegetables, and whole grains. These foods are high in fiber.  Eat foods that contain healthy fats, such as fatty fish, nuts, seeds, and olive oil.  Avoid foods that are high in added sugar, added salt (sodium), saturated fat, and trans fat.  Avoid low-fiber, refined carbohydrates such as white bread, crackers, noodles, and white rice.  Avoid foods with partially hydrogenated oils (trans fats), such as fried foods or stick margarine.  Limit alcohol intake to no more than 1 drink a day for nonpregnant women and 2 drinks a day for men. One drink equals 12 oz of beer, 5 oz of wine, or 1 oz of hard liquor. Your health care provider may recommend that you drink less depending on your overall health. Reading food labels  Check food labels for the amount of saturated fat. Choose foods with no or very little saturated fat.  Check food labels for the amount of trans fat. Choose foods with no trans fat.  Check food labels for the amount of cholesterol. Choose foods low in cholesterol. Ask your dietitian how much cholesterol you should have each day.  Check food labels for the amount of sodium. Choose foods with less  than 140 milligrams (mg) per serving. Shopping  Buy dairy products labeled as nonfat (skim) or low-fat (1%).  Avoid buying processed or prepackaged foods. These are often high in added sugar, sodium, and fat. Cooking  Choose healthy fats when cooking, such as olive oil or canola oil.  Cook foods using lower fat methods, such as baking, broiling, boiling, or grilling.  Make your own sauces, dressings, and marinades when possible, instead of buying them. Store-bought sauces, dressings, and marinades are often high in sodium and sugar. Meal planning  Eat more home-cooked food and less restaurant, buffet, and fast food.  Eat fatty fish at least 2 times each week. Examples of fatty fish include salmon, trout, mackerel, tuna, and herring.  If you eat whole eggs, do not eat more than 3 egg yolks per week. What foods are recommended? The items listed may not be a complete list. Talk with your dietitian about what dietary choices are best for you. Grains Whole wheat or whole grain breads, crackers, cereals, and pasta. Unsweetened oatmeal. Bulgur. Barley. Quinoa. Brown rice. Whole wheat flour tortillas. Vegetables Fresh or frozen vegetables. Low-sodium canned vegetables. Fruits All fresh, canned (in natural juice), or frozen fruits. Meats and other protein foods Skinless chicken or [place]. Ground chicken or [place]. Lean cuts of pork, trimmed of fat. Fish and seafood, especially salmon, trout, and herring. Egg whites. Dried beans, peas, or lentils. Unsalted nuts or seeds. Unsalted canned beans. Natural peanut or almond butter. Dairy Low-fat dairy  products. Skim or low-fat (1%) milk. Reduced fat (2%) and low-sodium cheese. Low-fat ricotta cheese. Low-fat cottage cheese. Plain, low-fat yogurt. Fats and oils Tub margarine without trans fats. Light or reduced-fat mayonnaise. Light or reduced-fat salad dressings. Avocado. Safflower, olive, sunflower, soybean, and canola oils. What foods are not  recommended? The items listed may not be a complete list. Talk with your dietitian about what dietary choices are best for you. Grains White bread. White (regular) pasta. White rice. Cornbread. Bagels. Pastries. Crackers that contain trans fat. Vegetables Creamed or fried vegetables. Vegetables in a cheese sauce. Fruits Sweetened dried fruit. Canned fruit in syrup. Fruit juice. Meats and other protein foods Fatty cuts of meat. Ribs. Chicken wings. Berniece Salines. Sausage. Bologna. Salami. Chitterlings. Fatback. Hot dogs. Bratwurst. Packaged lunch meats. Dairy Whole or reduced-fat (2%) milk. Half-and-half. Cream cheese. Full-fat or sweetened yogurt. Full-fat cheese. Nondairy creamers. Whipped toppings. Processed cheese or cheese spreads. Cheese curds. Beverages Alcohol. Sweetened drinks, such as soda, lemonade, fruit drinks, or punches. Fats and oils Butter. Stick margarine. Lard. Shortening. Ghee. Bacon fat. Tropical oils, such as coconut, palm kernel, or palm oils. Sweets and desserts Corn syrup. Sugars. Honey. Molasses. Candy. Jam and jelly. Syrup. Sweetened cereals. Cookies. Pies. Cakes. Donuts. Muffins. Ice cream. Condiments Store-bought sauces, dressings, and marinades that are high in sugar, such as ketchup and barbecue sauce. Summary  High levels of triglycerides can increase the risk of heart disease and stroke. Choosing the right foods can help lower your triglycerides.  Eat plenty of fresh fruits, vegetables, and whole grains. Choose low-fat dairy and lean meats. Eat fatty fish at least twice a week.  Avoid processed and prepackaged foods with added sugar, sodium, saturated fat, and trans fat.  If you need suggestions or have questions about what types of food are good for you, talk with your health care provider or a dietitian. This information is not intended to replace advice given to you by your health care provider. Make sure you discuss any questions you have with your health care  provider. Document Revised: 01/26/2017 Document Reviewed: 04/18/2016 Elsevier Patient Education  2020 [employer].

## 2019-11-18 ENCOUNTER — Ambulatory Visit (INDEPENDENT_AMBULATORY_CARE_PROVIDER_SITE_OTHER): Payer: Medicare Other | Admitting: Nurse Practitioner

## 2019-11-18 ENCOUNTER — Encounter: Payer: Self-pay | Admitting: Nurse Practitioner

## 2019-11-18 VITALS — BP 162/66 | HR 55 | Temp 97.5°F | Resp 20 | Ht 66.0 in | Wt 178.0 lb

## 2019-11-18 DIAGNOSIS — H6122 Impacted cerumen, left ear: Secondary | ICD-10-CM

## 2019-11-18 NOTE — Progress Notes (Signed)
   Subjective:    Patient ID: Carol [name], female    DOB: 1945/09/13, 74 y.o.   MRN: [id number]   Chief Complaint: Check ears   HPI Patient went to have hearing aids calibrated yesterday and was told she had a lot of wax in her left ear ad they could not calibrate it. So she is here today to have ears cleaned out.   Review of Systems  Constitutional: Negative for diaphoresis.  Eyes: Negative for pain.  Respiratory: Negative for shortness of breath.   Cardiovascular: Negative for chest pain, palpitations and leg swelling.  Gastrointestinal: Negative for abdominal pain.  Endocrine: Negative for polydipsia.  Skin: Negative for rash.  Neurological: Negative for dizziness, weakness and headaches.  Hematological: Does not bruise/bleed easily.  All other systems reviewed and are negative.      Objective:   Physical Exam Vitals and nursing note reviewed.  Constitutional:      Appearance: Normal appearance.  HENT:     Right Ear: Tympanic membrane normal.     Left Ear: Tympanic membrane normal. There is impacted cerumen (removed with currette).  Cardiovascular:     Rate and Rhythm: Normal rate and regular rhythm.     Heart sounds: Normal heart sounds.  Skin:    General: Skin is warm.  Neurological:     General: No focal deficit present.     Mental Status: She is alert and oriented to person, place, and time.  Psychiatric:        Mood and Affect: Mood normal.        Behavior: Behavior normal.   BP (!) 162/66   Pulse (!) 55   Temp (!) 97.5 F (36.4 C) (Temporal)   Resp 20   Ht 5\' 6"  (1.676 m)   Wt 178 lb (80.7 kg)   BMI 28.73 kg/m         Assessment & Plan:  Carol [name] in today with chief complaint of Check ears   1. Impacted cerumen of left ear De[place]x 2x a week    The above assessment and management plan was discussed with the patient. The patient verbalized understanding of and has agreed to the management plan. Patient is aware to call the clinic if  symptoms persist or worsen. Patient is aware when to return to the clinic for a follow-up visit. Patient educated on when it is appropriate to go to the emergency department.   Mary-Margaret Hassell Done, FNP

## 2019-11-18 NOTE — Patient Instructions (Signed)
Earwax Buildup, Adult The ears produce a substance called earwax that helps keep bacteria out of the ear and protects the skin in the ear canal. Occasionally, earwax can build up in the ear and cause discomfort or hearing loss. What increases the risk? This condition is more likely to develop in people who:  Are female.  Are elderly.  Naturally produce more earwax.  Clean their ears often with cotton swabs.  Use earplugs often.  Use in-ear headphones often.  Wear hearing aids.  Have narrow ear canals.  Have earwax that is overly thick or sticky.  Have eczema.  Are dehydrated.  Have excess hair in the ear canal. What are the signs or symptoms? Symptoms of this condition include:  Reduced or muffled hearing.  A feeling of fullness in the ear or feeling that the ear is plugged.  Fluid coming from the ear.  Ear pain.  Ear itch.  Ringing in the ear.  Coughing.  An obvious piece of earwax that can be seen inside the ear canal. How is this diagnosed? This condition may be diagnosed based on:  Your symptoms.  Your medical history.  An ear exam. During the exam, your health care provider will look into your ear with an instrument called an otoscope. You may have tests, including a hearing test. How is this treated? This condition may be treated by:  Using ear drops to soften the earwax.  Having the earwax removed by a health care provider. The health care provider may: ? Flush the ear with water. ? Use an instrument that has a loop on the end (curette). ? Use a suction device.  Surgery to remove the wax buildup. This may be done in severe cases. Follow these instructions at home:   Take over-the-counter and prescription medicines only as told by your health care provider.  Do not put any objects, including cotton swabs, into your ear. You can clean the opening of your ear canal with a washcloth or facial tissue.  Follow instructions from your health care  provider about cleaning your ears. Do not over-clean your ears.  Drink enough fluid to keep your urine clear or pale yellow. This will help to thin the earwax.  Keep all follow-up visits as told by your health care provider. If earwax builds up in your ears often or if you use hearing aids, consider seeing your health care provider for routine, preventive ear cleanings. Ask your health care provider how often you should schedule your cleanings.  If you have hearing aids, clean them according to instructions from the manufacturer and your health care provider. Contact a health care provider if:  You have ear pain.  You develop a fever.  You have blood, pus, or other fluid coming from your ear.  You have hearing loss.  You have ringing in your ears that does not go away.  Your symptoms do not improve with treatment.  You feel like the room is spinning (vertigo). Summary  Earwax can build up in the ear and cause discomfort or hearing loss.  The most common symptoms of this condition include reduced or muffled hearing and a feeling of fullness in the ear or feeling that the ear is plugged.  This condition may be diagnosed based on your symptoms, your medical history, and an ear exam.  This condition may be treated by using ear drops to soften the earwax or by having the earwax removed by a health care provider.  Do not put any   objects, including cotton swabs, into your ear. You can clean the opening of your ear canal with a washcloth or facial tissue. This information is not intended to replace advice given to you by your health care provider. Make sure you discuss any questions you have with your health care provider. Document Revised: 01/26/2017 Document Reviewed: 04/26/2016 Elsevier Patient Education  2020 [employer] Inc[employer]  

## 2019-11-21 ENCOUNTER — Telehealth: Payer: Self-pay | Admitting: Family Medicine

## 2019-11-21 DIAGNOSIS — E785 Hyperlipidemia, unspecified: Secondary | ICD-10-CM

## 2019-11-21 NOTE — Telephone Encounter (Signed)
Attempted to reach patient with regards to her recent cholesterol panel which showed uncontrolled cholesterol.  She had Lovaza ordered by Dr. Warrick Parisian but after speaking to her cardiologist, he recommended that we pursue reasons for elevation.  I would like to collect a thyroid panel on her.  I have ordered this.  Please have her come in to have this completed.  Additionally he prefers a Vascepa over Amistad if her insurance will cover this.  I will wait for lab results before making any additional medication changes.

## 2019-12-04 ENCOUNTER — Ambulatory Visit (INDEPENDENT_AMBULATORY_CARE_PROVIDER_SITE_OTHER): Payer: Medicare Other | Admitting: *Deleted

## 2019-12-04 DIAGNOSIS — Z0000 Encounter for general adult medical examination without abnormal findings: Secondary | ICD-10-CM

## 2019-12-04 NOTE — Progress Notes (Signed)
MEDICARE ANNUAL WELLNESS VISIT  12/04/2019  Telephone Visit Disclaimer This Medicare AWV was conducted by telephone due to national recommendations for restrictions regarding the COVID-19 Pandemic (e.g. social distancing).  I verified, using two identifiers, that I am speaking with Carol [name] or their authorized healthcare agent. I discussed the limitations, risks, security, and privacy concerns of performing an evaluation and management service by telephone and the potential availability of an in-person appointment in the future. The patient expressed understanding and agreed to proceed.  Location of Patient: home Location of Provider (nurse): office  Subjective:    Carol [name] is a 74 y.o. female patient of Janora Norlander, DO who had a Medicare Annual Wellness Visit today via telephone. Carol [name] is Retired and lives alone. she has 2 children. she reports that she is socially active and does interact with friends/family regularly. she is minimally physically active and enjoys walking, outside work and [employer].  Patient Care Team: Janora Norlander, DO as PCP - General (Family Medicine) Lavonna Monarch, MD as Consulting Physician (Dermatology) Luberta Mutter, MD as Consulting Physician (Ophthalmology) Ronald Lobo, MD as Consulting Physician (Gastroenterology) Pixie Casino, MD as Consulting Physician (Cardiology)  Advanced Directives 12/04/2019 09/26/2019 12/03/2018 11/27/2017 02/03/2016 10/07/2014  Does Patient Have a Medical Advance Directive? Yes Yes Yes Yes Yes Yes  Type of Paramedic of [place];Living will [place];Living will Living will;Healthcare Power of [place];Living will [place];Living will [place];Living will  Does patient want to make changes to medical advance directive? No - Patient declined - No - Patient declined No - Patient  declined No - Patient declined Yes - information given  Copy of [place] in Chart? No - copy requested - No - copy requested No - copy requested No - copy requested No - copy requested  Would patient like information on creating a medical advance directive? - - - No - Patient declined - [facility] Utilization Over the Past 12 Months: # of hospitalizations or ER visits: 0 # of surgeries: 0  Review of Systems    Patient reports that her overall health is unchanged compared to last year.  History obtained from chart review and the patient  Patient Reported Readings (BP, Pulse, CBG, Weight, etc) none  Pain Assessment Pain : No/denies pain     Current Medications & Allergies (verified) Allergies as of 12/04/2019      Reactions   Penicillins Hives   Sulfa Antibiotics    Sulfamethoxazole Itching   Lipitor [atorvastatin] Diarrhea   Crestor [rosuvastatin Calcium] Other (See Comments)   Myalgias      Medication List       Accurate as of December 04, 2019  8:50 AM. If you have any questions, ask your nurse or doctor.        aspirin EC 81 MG tablet Take 81 mg by mouth daily.   Calcium 500-125 MG-UNIT Tabs 1 tablet. 1 tablet daily.   meloxicam 15 MG tablet Commonly known as: MOBIC Take 1 tablet (15 mg total) by mouth daily.   omega-3 acid ethyl esters 1 g capsule Commonly known as: Lovaza Take 2 capsules (2 g total) by mouth 2 (two) times daily.   Repatha SureClick 580 MG/ML Soaj Generic drug: Evolocumab INJECT 1 DOSE SUBCUTANEOUSLY EVERY 14 DAYS   rosuvastatin 5 MG tablet Commonly known as: CRESTOR TAKE 1/2 (ONE-HALF) TABLET BY MOUTH  THREE TIMES A WEEK   Vitamin D3 50 MCG (2000 UT) capsule 1 capsule.       History (reviewed): Past Medical History:  Diagnosis Date  . Carotid artery stenosis   . Cataract   . Diverticulosis   . Fractured hand    right  . Hyperlipidemia   . Osteopenia   . Precancerous skin lesion 2016   Past Surgical  History:  Procedure Laterality Date  . CATARACT EXTRACTION W/ INTRAOCULAR LENS IMPLANT Bilateral   . CHOLECYSTECTOMY  2009  . EYE SURGERY    . hand fracture surgery     Family History  Problem Relation Age of Onset  . Heart disease Mother   . Heart disease Father   . Cancer Sister        breast  . Hyperlipidemia Sister   . Diabetes Sister   . Cancer Sister        bone  . Alzheimer's disease Sister   . Heart disease Brother   . CAD Brother   . Cancer Sister        breast  . Kidney disease Sister   . Heart disease Brother   . Hypertension Brother   . Alzheimer's disease Brother   . Kidney disease Brother        2018 starting dialysis   Social History   Socioeconomic History  . Marital status: Widowed    Spouse name: Not on file  . Number of children: 2  . Years of education: 95  . Highest education level: High school graduate  Occupational History  . Occupation: retired  Tobacco Use  . Smoking status: Never Smoker  . Smokeless tobacco: Never Used  Vaping Use  . Vaping Use: Never used  Substance and Sexual Activity  . Alcohol use: No  . Drug use: No  . Sexual activity: Not Currently  Other Topics Concern  . Not on file  Social History Narrative  . Not on file   Social Determinants of Health   Financial Resource Strain:   . Difficulty of Paying Living Expenses: Not on file  Food Insecurity:   . Worried About Charity fundraiser in the Last Year: Not on file  . Ran Out of Food in the Last Year: Not on file  Transportation Needs: No Transportation Needs  . Lack of Transportation (Medical): No  . Lack of Transportation (Non-Medical): No  Physical Activity: Sufficiently Active  . Days of Exercise per Week: 5 days  . Minutes of Exercise per Session: 30 min  Stress:   . Feeling of Stress : Not on file  Social Connections:   . Frequency of Communication with Friends and Family: Not on file  . Frequency of Social Gatherings with Friends and Family: Not on  file  . Attends Religious Services: Not on file  . Active Member of Clubs or Organizations: Not on file  . Attends Archivist Meetings: Not on file  . Marital Status: Not on file    Activities of Daily Living In your present state of health, do you have any difficulty performing the following activities: 12/04/2019  Hearing? Y  Comment wears bilateral hearing aids  Vision? N  Difficulty concentrating or making decisions? N  Walking or climbing stairs? N  Dressing or bathing? N  Doing errands, shopping? N  Preparing Food and eating ? N  Using the Toilet? N  In the past six months, have you accidently leaked urine? N  Do you have problems with  loss of bowel control? N  Managing your Medications? N  Managing your Finances? N  Housekeeping or managing your Housekeeping? N  Some recent data might be hidden    Patient Education/ Literacy How often do you need to have someone help you when you read instructions, pamphlets, or other written materials from your doctor or pharmacy?: 1 - Never What is the last grade level you completed in school?: 12  Exercise Current Exercise Habits: Home exercise routine, Type of exercise: walking, Time (Minutes): 30, Frequency (Times/Week): 5, Weekly Exercise (Minutes/Week): 150, Intensity: Mild, Exercise limited by: orthopedic condition(s) (plntar fascitis)  Diet Patient reports consuming 2 meals a day and 1 snack(s) a day Patient reports that her primary diet is: Regular Patient reports that she does have regular access to food.   Depression Screen PHQ 2/9 Scores 12/04/2019 11/18/2019 11/17/2019 06/30/2019 05/15/2019 12/03/2018 11/13/2018  PHQ - 2 Score 0 0 0 0 0 0 0  PHQ- 9 Score - - 0 - - - -     Fall Risk Fall Risk  12/04/2019 11/18/2019 11/17/2019 06/30/2019 05/15/2019  Falls in the past year? 0 0 0 1 0  Number falls in past yr: - - - 0 -  Injury with Fall? - - - 0 -  Comment - - - - -  Risk for fall due to : - - - Impaired balance/gait -    Follow up - - - Falls evaluation completed -  Comment - - - - -     Objective:  Carol [name] seemed alert and oriented and she participated appropriately during our telephone visit.  Blood Pressure Weight BMI  BP Readings from Last 3 Encounters:  11/18/19 (!) 162/66  11/17/19 139/77  09/26/19 (!) 182/71   Wt Readings from Last 3 Encounters:  11/18/19 178 lb (80.7 kg)  11/17/19 178 lb (80.7 kg)  09/26/19 182 lb (82.6 kg)   BMI Readings from Last 1 Encounters:  11/18/19 28.73 kg/m    *Unable to obtain current vital signs, weight, and BMI due to telephone visit type  Hearing/Vision  . Shadae did not seem to have difficulty with hearing/understanding during the telephone conversation . Reports that she has had a formal eye exam by an eye care professional within the past year . Reports that she has had a formal hearing evaluation within the past year *Unable to fully assess hearing and vision during telephone visit type  Cognitive Function: 6CIT Screen 12/04/2019 12/03/2018  What Year? 0 points 0 points  What month? 0 points 0 points  What time? 0 points 0 points  Count back from 20 0 points 0 points  Months in reverse 0 points 0 points  Repeat phrase 0 points 0 points  Total Score 0 0   (Normal:0-7, Significant for Dysfunction: >8)  Normal Cognitive Function Screening: Yes   Immunization & Health Maintenance Record Immunization History  Administered Date(s) Administered  . Influenza,inj,Quad PF,6+ Mos 11/26/2012, 11/26/2013, 12/04/2014  . Moderna SARS-COVID-2 Vaccination 04/10/2019, 05/09/2019  . Tdap 02/27/2006  . Zoster 12/19/2013    Health Maintenance  Topic Date Due  . MAMMOGRAM  02/10/2020  . DEXA SCAN  05/14/2021  . COVID-19 Vaccine  Completed  . Hepatitis C Screening  Completed  . INFLUENZA VACCINE  Discontinued  . TETANUS/TDAP  Discontinued  . PNA vac Low Risk Adult  Discontinued       Assessment  This is a routine wellness examination for  ALLAHNA HUSBAND.  Health Maintenance: Due or  Overdue There are no preventive care reminders to display for this patient.  Carol [name] does not need a referral for Community Assistance: Care Management:   no Social Work:    no Prescription Assistance:  no Nutrition/Diabetes Education:  no   Plan:  Personalized Goals Goals Addressed            This Visit's Progress   . Patient Stated       Walk more as her plantar fascitis improves    . Weight (lb) < 170 lb (77.1 kg)       Reduce soda consumption to one per week.  Continue goal      Personalized Health Maintenance & Screening Recommendations  Shingles vaccine  Lung Cancer Screening Recommended: no (Low Dose CT Chest recommended if Age 15-80 years, 30 pack-year currently smoking OR have quit w/in past 15 years) Hepatitis C Screening recommended: no HIV Screening recommended: no  Advanced Directives: Written information was not prepared per patient's request.  Referrals & Orders No orders of the defined types were placed in this encounter.   Follow-up Plan . Follow-up with Janora Norlander, DO as planned on 02/16/20. . Offer shingles vaccine at next appt. . Health maintenance is up to date. . Pt wears bilateral hearing aids and has no difficulties with hearing. . Vision is good . Pt has living will and POA, requested a copy of these documents for our records. . Pt is independent with all ADL's. . AVS printed and mailed to pt.     I have personally reviewed and noted the following in the patient's chart:   . Medical and social history . Use of alcohol, tobacco or illicit drugs  . Current medications and supplements . Functional ability and status . Nutritional status . Physical activity . Advanced directives . List of other physicians . Hospitalizations, surgeries, and ER visits in previous 12 months . Vitals . Screenings to include cognitive, depression, and falls . Referrals and appointments  In  addition, I have reviewed and discussed with Carol [name] certain preventive protocols, quality metrics, and best practice recommendations. A written personalized care plan for preventive services as well as general preventive health recommendations is available and can be mailed to the patient at her request.      Rana Snare, LPN  [id number]

## 2019-12-05 NOTE — Progress Notes (Signed)
Cardiology Office Note:    Date:  12/08/2019   ID:  Carol [name], DOB 1946/01/26, MRN [id number]  PCP:  Janora Norlander, DO  Cardiologist:  Pixie Casino, MD   Referring MD: Janora Norlander, DO   Chief Complaint  Patient presents with  . Follow-up    12 months.    History of Present Illness:    Carol [name] is a 74 y.o. female with a hx of mild to moderate bilateral carotid artery stenosis and HLD now on repatha. She has a family history of premature coronary artery disease. She has followed with Dr. Debara Pickett and was last seen 11/2018. She was doing well at that time. Lipids responded well to repatha.   She presents today for annual follow up. She is very concerned about her triglycerides. She does not remember eating a fatty meal the night before her labs. She is doing well on lovaza. She has unfortunately suffered plantar fasciitis which has stopped her walking program - used to walk 3 miles daily and works in the yard. She is vaccinated against COVID-19. Her sister died this past year right after the second COVID shot with carotid artery stenosis and stroke.    Past Medical History:  Diagnosis Date  . Carotid artery stenosis   . Cataract   . Diverticulosis   . Fractured hand    right  . Hyperlipidemia   . Osteopenia   . Precancerous skin lesion 2016    Past Surgical History:  Procedure Laterality Date  . CATARACT EXTRACTION W/ INTRAOCULAR LENS IMPLANT Bilateral   . CHOLECYSTECTOMY  2009  . EYE SURGERY    . hand fracture surgery      Current Medications: Current Meds  Medication Sig  . aspirin EC 81 MG tablet Take 81 mg by mouth daily.  . Calcium 500-125 MG-UNIT TABS 1 tablet. 1 tablet daily.  . Cholecalciferol (VITAMIN D3) 2000 units capsule 1 capsule.  . Evolocumab (REPATHA SURECLICK) 382 MG/ML SOAJ INJECT 1 DOSE SUBCUTANEOUSLY EVERY 14 DAYS  . omega-3 acid ethyl esters (LOVAZA) 1 g capsule Take 2 capsules (2 g total) by mouth 2 (two) times daily.    . rosuvastatin (CRESTOR) 5 MG tablet TAKE 1/2 (ONE-HALF) TABLET BY MOUTH THREE TIMES A WEEK  . [DISCONTINUED] meloxicam (MOBIC) 15 MG tablet Take 1 tablet (15 mg total) by mouth daily.     Allergies:   Penicillins, Sulfa antibiotics, Sulfamethoxazole, Lipitor [atorvastatin], and Crestor [rosuvastatin calcium]   Social History   Socioeconomic History  . Marital status: Widowed    Spouse name: Not on file  . Number of children: 2  . Years of education: 85  . Highest education level: High school graduate  Occupational History  . Occupation: retired  Tobacco Use  . Smoking status: Never Smoker  . Smokeless tobacco: Never Used  Vaping Use  . Vaping Use: Never used  Substance and Sexual Activity  . Alcohol use: No  . Drug use: No  . Sexual activity: Not Currently  Other Topics Concern  . Not on file  Social History Narrative  . Not on file   Social Determinants of Health   Financial Resource Strain:   . Difficulty of Paying Living Expenses: Not on file  Food Insecurity:   . Worried About Charity fundraiser in the Last Year: Not on file  . Ran Out of Food in the Last Year: Not on file  Transportation Needs: No Transportation Needs  . Lack of Transportation (  Medical): No  . Lack of Transportation (Non-Medical): No  Physical Activity: Sufficiently Active  . Days of Exercise per Week: 5 days  . Minutes of Exercise per Session: 30 min  Stress:   . Feeling of Stress : Not on file  Social Connections:   . Frequency of Communication with Friends and Family: Not on file  . Frequency of Social Gatherings with Friends and Family: Not on file  . Attends Religious Services: Not on file  . Active Member of Clubs or Organizations: Not on file  . Attends Archivist Meetings: Not on file  . Marital Status: Not on file     Family History: The patient's family history includes Alzheimer's disease in her brother and sister; CAD in her brother; Cancer in her sister, sister,  and sister; Diabetes in her sister; Heart disease in her brother, brother, father, and mother; Hyperlipidemia in her sister; Hypertension in her brother; Kidney disease in her brother and sister.  ROS:   Please see the history of present illness.    All other systems reviewed and are negative.  EKGs/Labs/Other Studies Reviewed:    The following studies were reviewed today:  Carotid dopplers 07/2019 Summary:  Right Carotid: Velocities in the right ICA are consistent with a 40-59%         stenosis. Stable ICA stenosis.   Left Carotid: Velocities in the left ICA are consistent with a 1-39%  stenosis.        Stable ICA stenosis.   Vertebrals: Bilateral vertebral arteries demonstrate antegrade flow.  Subclavians: Normal flow hemodynamics were seen in bilateral subclavian        arteries.   EKG:  EKG is ordered today.  The ekg ordered today demonstrates sinus rhythm with HR 62  Recent Labs: 11/06/2019: ALT 15; BUN 14; Creatinine, Ser 0.89; Hemoglobin 14.3; Platelets 191; Potassium 4.7; Sodium 144  Recent Lipid Panel    Component Value Date/Time   CHOL 166 11/06/2019 0831   CHOL 151 10/22/2012 0833   TRIG 654 (HH) 11/06/2019 0831   TRIG 260 (H) 12/21/2016 1618   TRIG 80 10/22/2012 0833   HDL 39 (L) 11/06/2019 0831   HDL 47 12/21/2016 1618   HDL 51 10/22/2012 0833   CHOLHDL 4.3 11/06/2019 0831   LDLCALC 37 11/06/2019 0831   LDLCALC 87 07/29/2013 0804   LDLCALC 84 10/22/2012 0833   LDLDIRECT 93 10/08/2014 0759    Physical Exam:    VS:  BP (!) 162/80 (BP Location: Left Arm, Patient Position: Sitting, Cuff Size: Normal)   Pulse 62   Ht 5\' 6"  (1.676 m)   Wt 182 lb (82.6 kg)   BMI 29.38 kg/m     Wt Readings from Last 3 Encounters:  12/08/19 182 lb (82.6 kg)  11/18/19 178 lb (80.7 kg)  11/17/19 178 lb (80.7 kg)     GEN: elderly female in no acute distress HEENT: Normal NECK: No JVD; No carotid bruits LYMPHATICS: No lymphadenopathy CARDIAC: RRR,  no murmurs, rubs, gallops RESPIRATORY:  Clear to auscultation without rales, wheezing or rhonchi  ABDOMEN: Soft, non-tender, non-distended MUSCULOSKELETAL:  No edema; No deformity  SKIN: Warm and dry NEUROLOGIC:  Alert and oriented x 3 PSYCHIATRIC:  Normal affect   ASSESSMENT:    1. Hyperlipidemia LDL goal <70   2. Hypertriglyceridemia   3. Bilateral carotid artery stenosis   4. Family history of heart disease    PLAN:    In order of problems listed above:  Hyperlipidemia 11/06/2019: Cholesterol,  Total 166; HDL 39; LDL Chol Calc (NIH) 37; Triglycerides 654 - pt has done well on repatha, but now triglycerides are significantly elevated to 654 from 185 in March 2021 - she was started on lovaza - recheck fasting labs - no signs or symptoms of pancreatitis - she does not drink alcohol, but has stopped walking 3 miles a day due to plantar fasciitis - will recheck fasting lipids in 2 weeks   Carotic artery stenosis - [place] 07/2019 stable disease - recheck in 1 year   Hypertension - white coat hypertension - runs in the 130s/70s at home - will hold off on adding an anti-hypertensive, but she will continue to monitor at home   Follow up with Dr. Debara Pickett in 1 year.    Medication Adjustments/Labs and Tests Ordered: Current medicines are reviewed at length with the patient today.  Concerns regarding medicines are outlined above.  No orders of the defined types were placed in this encounter.  No orders of the defined types were placed in this encounter.   Signed, Ledora Bottcher, PA  12/08/2019 10:30 AM    [facility]

## 2019-12-08 ENCOUNTER — Encounter: Payer: Self-pay | Admitting: Physician Assistant

## 2019-12-08 ENCOUNTER — Ambulatory Visit (INDEPENDENT_AMBULATORY_CARE_PROVIDER_SITE_OTHER): Payer: Medicare Other | Admitting: Physician Assistant

## 2019-12-08 VITALS — BP 162/80 | HR 62 | Ht 66.0 in | Wt 182.0 lb

## 2019-12-08 DIAGNOSIS — E781 Pure hyperglyceridemia: Secondary | ICD-10-CM

## 2019-12-08 DIAGNOSIS — E785 Hyperlipidemia, unspecified: Secondary | ICD-10-CM

## 2019-12-08 DIAGNOSIS — I6523 Occlusion and stenosis of bilateral carotid arteries: Secondary | ICD-10-CM

## 2019-12-08 DIAGNOSIS — Z8249 Family history of ischemic heart disease and other diseases of the circulatory system: Secondary | ICD-10-CM | POA: Diagnosis not present

## 2019-12-08 NOTE — Patient Instructions (Signed)
Medication Instructions:  Continue current medications  *If you need a refill on your cardiac medications before your next appointment, please call your pharmacy*   Lab Work: Fasting Lipid and Lipase in 2 weeks  If you have labs (blood work) drawn today and your tests are completely normal, you will receive your results only by:  [place] (if you have MyChart) OR  A paper copy in the mail If you have any lab test that is abnormal or we need to change your treatment, we will call you to review the results.   Testing/Procedures: Your physician has requested that you have a carotid duplex in June 2022. This test is an ultrasound of the carotid arteries in your neck. It looks at blood flow through these arteries that supply the brain with blood. Allow one hour for this exam. There are no restrictions or special instructions.   Follow-Up: At [facility], you and your health needs are our priority.  As part of our continuing mission to provide you with exceptional heart care, we have created designated Provider Care Teams.  These Care Teams include your primary Cardiologist (physician) and Advanced Practice Providers (APPs -  Physician Assistants and Nurse Practitioners) who all work together to provide you with the care you need, when you need it.  We recommend signing up for the patient portal called "MyChart".  Sign up information is provided on this After Visit Summary.  MyChart is used to connect with patients for Virtual Visits (Telemedicine).  Patients are able to view lab/test results, encounter notes, upcoming appointments, etc.  Non-urgent messages can be sent to your provider as well.   To learn more about what you can do with MyChart, go to [email or web address].    Your next appointment:   1 year(s)  The format for your next appointment:   In Person  Provider:   You may see Pixie Casino, MD or one of the following Advanced Practice Providers on your designated  Care Team:    Almyra Deforest, PA-C  Fabian Sharp, PA-C or   Roby Lofts, [place]

## 2019-12-22 DIAGNOSIS — E781 Pure hyperglyceridemia: Secondary | ICD-10-CM | POA: Diagnosis not present

## 2019-12-22 DIAGNOSIS — E785 Hyperlipidemia, unspecified: Secondary | ICD-10-CM | POA: Diagnosis not present

## 2019-12-22 LAB — LIPID PANEL
Chol/HDL Ratio: 2.2 ratio (ref 0.0–4.4)
Cholesterol, Total: 108 mg/dL (ref 100–199)
HDL: 49 mg/dL (ref >39)
LDL Chol Calc (NIH): 37 mg/dL (ref 0–99)
Triglycerides: 128 mg/dL (ref 0–149)
VLDL Cholesterol Cal: 22 mg/dL (ref 5–40)

## 2019-12-22 LAB — LIPASE: Lipase: 33 U/L (ref 14–85)

## 2019-12-30 ENCOUNTER — Other Ambulatory Visit (HOSPITAL_COMMUNITY): Payer: Self-pay | Admitting: Family Medicine

## 2019-12-30 DIAGNOSIS — Z1231 Encounter for screening mammogram for malignant neoplasm of breast: Secondary | ICD-10-CM

## 2020-01-06 ENCOUNTER — Telehealth: Payer: Self-pay | Admitting: Internal Medicine

## 2020-01-06 NOTE — Telephone Encounter (Signed)
PA for repatha submitted via CMM (Key: [id number])

## 2020-01-06 NOTE — Telephone Encounter (Signed)
Effective from 01/06/2020 through 01/05/2021

## 2020-01-07 ENCOUNTER — Telehealth: Payer: Self-pay | Admitting: Internal Medicine

## 2020-01-07 NOTE — Telephone Encounter (Signed)
Looks like this is one of jenna's

## 2020-01-07 NOTE — Telephone Encounter (Signed)
New Message   Pt's Repatha prior authorization was approved on yesterday(01-06-20) for insurance coverage for 1 year,

## 2020-02-05 ENCOUNTER — Ambulatory Visit (HOSPITAL_COMMUNITY): Payer: Medicare Other

## 2020-02-06 IMAGING — MG DIGITAL SCREENING BILATERAL MAMMOGRAM WITH TOMO AND CAD
8 series · 8 of 24 positions shown · non-contrast
Comparison: Previous exam(s).

CLINICAL DATA: Screening.

EXAM:
DIGITAL SCREENING BILATERAL MAMMOGRAM WITH TOMO AND CAD

[R MLO synth-2D]
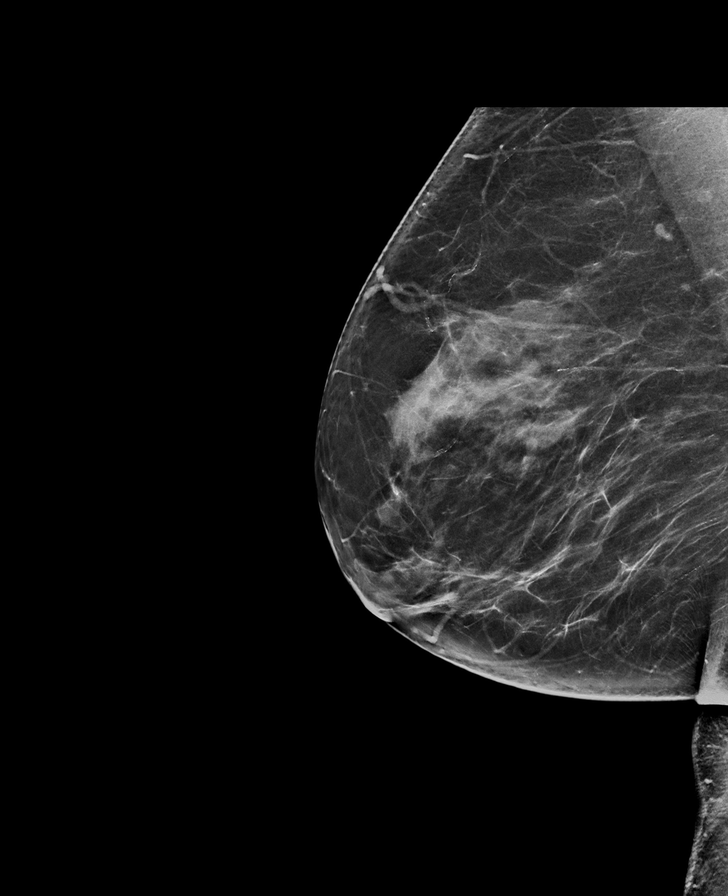

[R CC synth-2D]
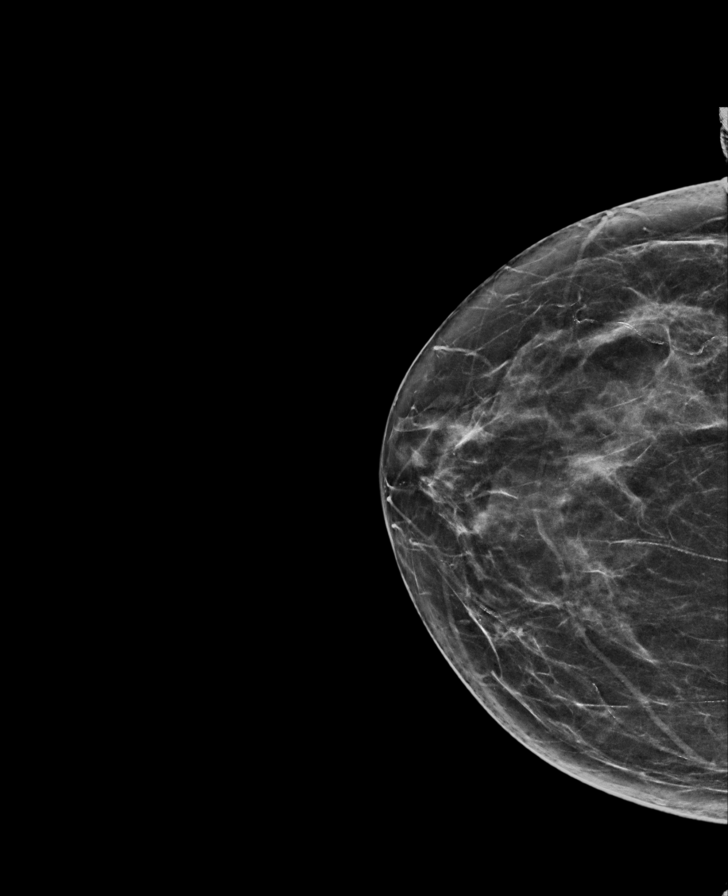

[L CC synth-2D]
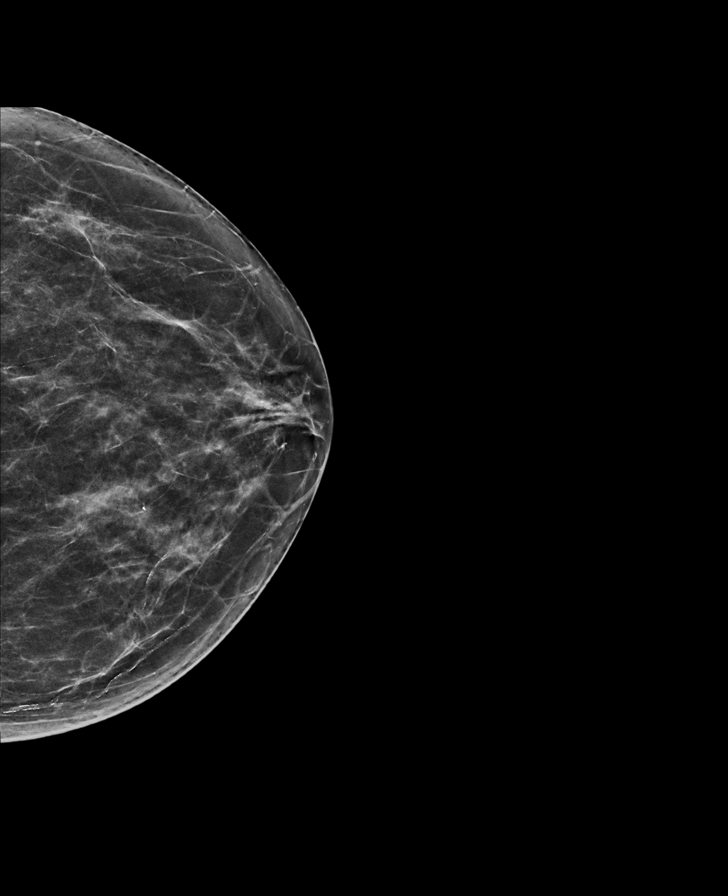

[L MLO synth-2D]
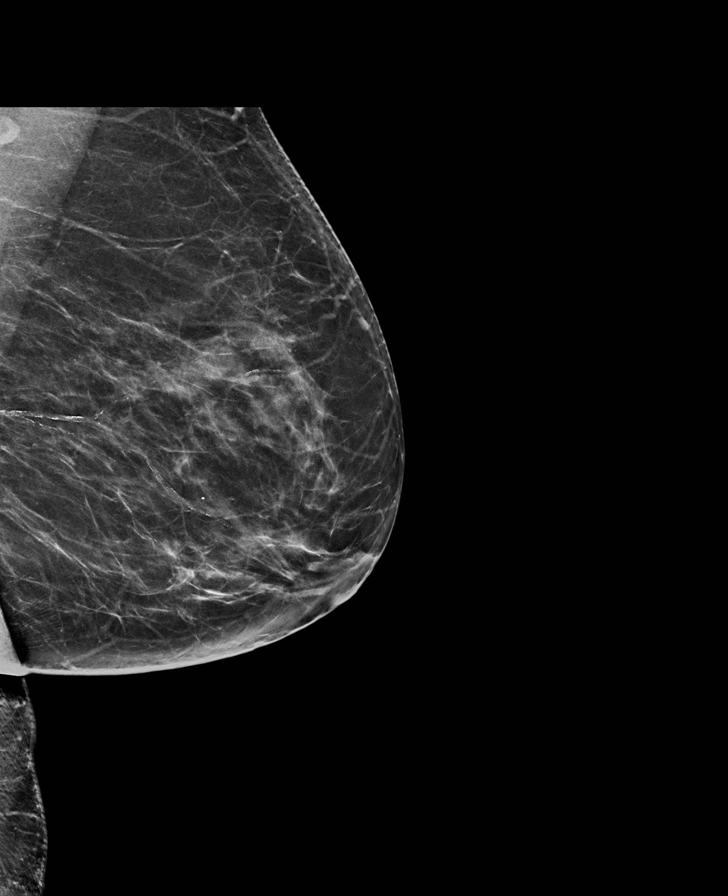

[L MLO tomo · tomo slice 35/70.0]
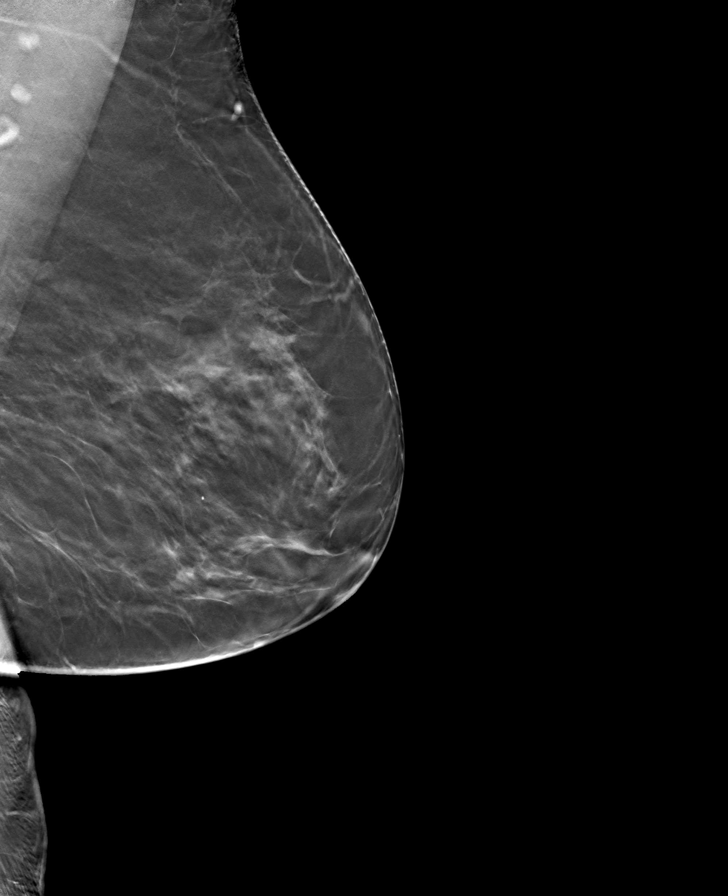

[L CC tomo · tomo slice 31/61.0]
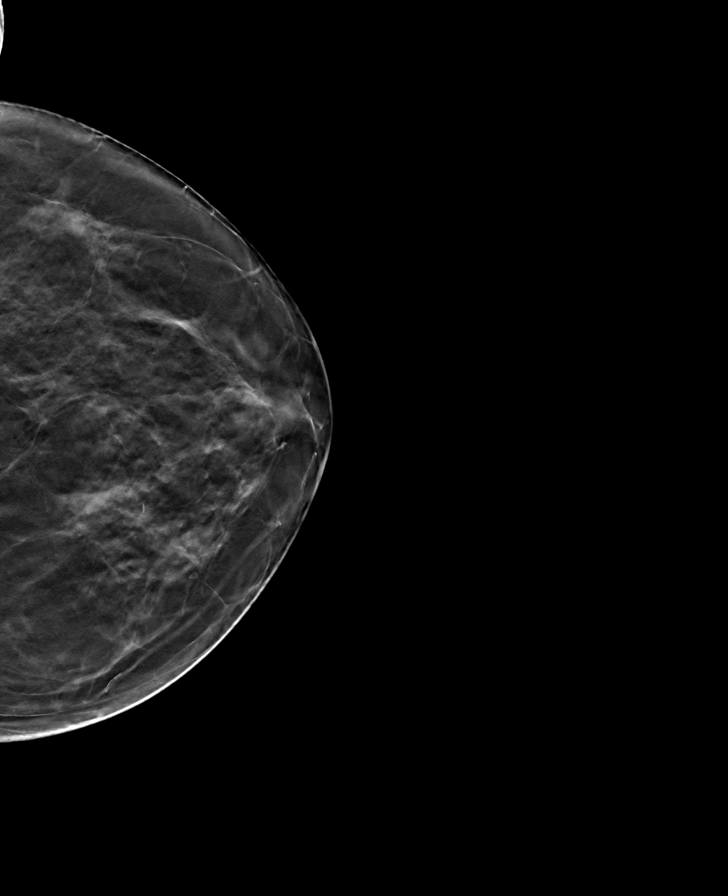

[R MLO tomo · tomo slice 37/73.0]
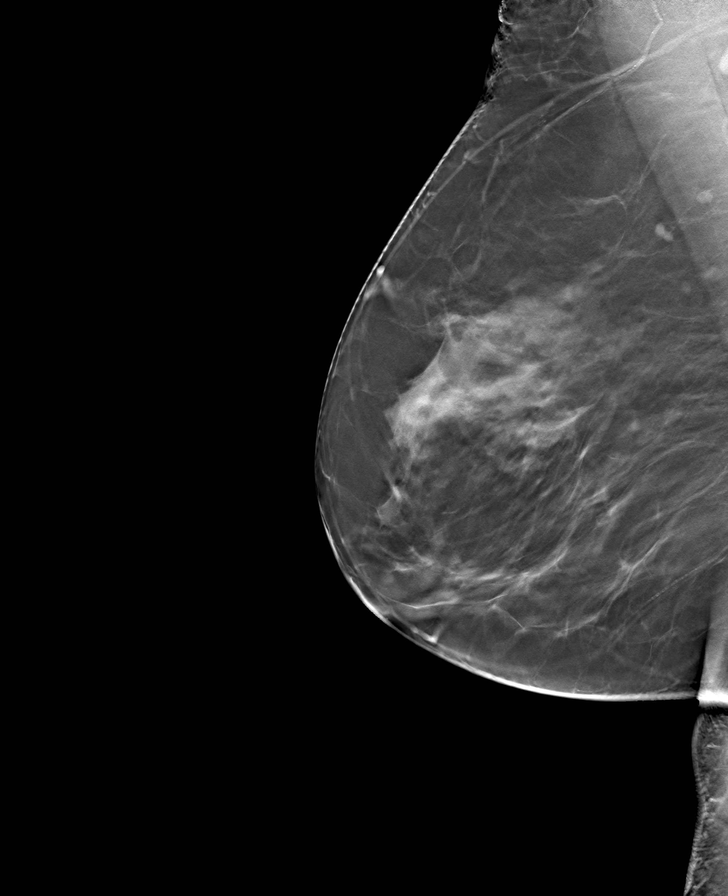

[R CC tomo · tomo slice 33/64.0]
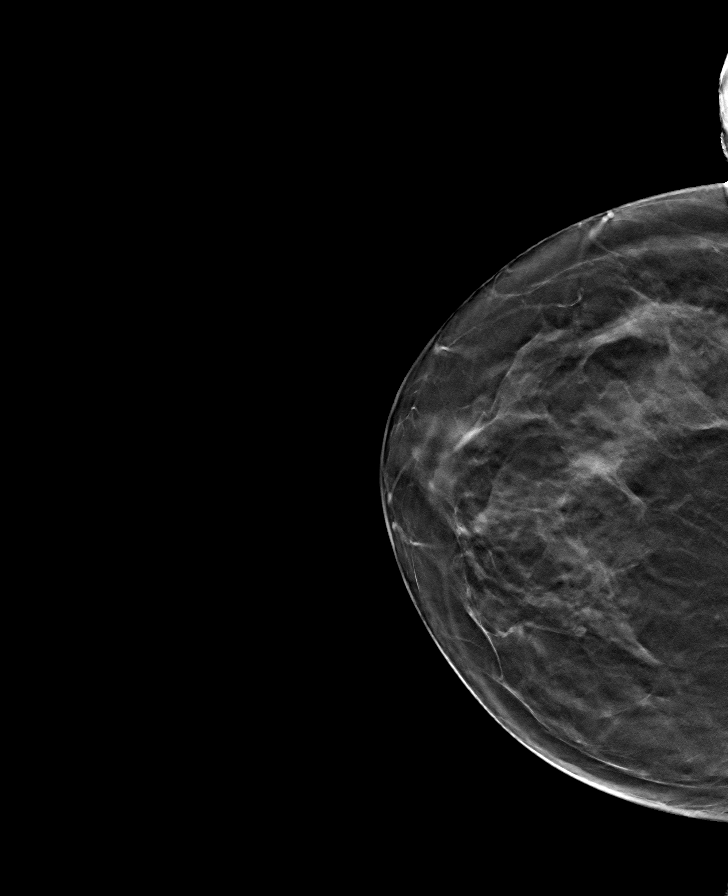

[8 of 24 positions shown; findings below may reference images not displayed]

ACR Breast Density Category b: There are scattered areas of
fibroglandular density.
FINDINGS: There are no findings suspicious for malignancy. Images were
processed with CAD.
IMPRESSION: No mammographic evidence of malignancy. A result letter of this
screening mammogram will be mailed directly to the patient.

RECOMMENDATION:
Screening mammogram in one year. (Code:[id number])

BI-RADS CATEGORY  1: Negative.

## 2020-02-13 ENCOUNTER — Telehealth: Payer: Self-pay

## 2020-02-13 ENCOUNTER — Other Ambulatory Visit: Payer: Self-pay

## 2020-02-13 ENCOUNTER — Ambulatory Visit (HOSPITAL_COMMUNITY)
Admission: RE | Admit: 2020-02-13 | Discharge: 2020-02-13 | Disposition: A | Discharge status: Home / Self Care | Payer: Medicare Other | Source: Ambulatory Visit | Attending: Family Medicine | Admitting: Family Medicine

## 2020-02-13 DIAGNOSIS — Z1231 Encounter for screening mammogram for malignant neoplasm of breast: Secondary | ICD-10-CM

## 2020-02-13 DIAGNOSIS — E785 Hyperlipidemia, unspecified: Secondary | ICD-10-CM

## 2020-02-13 NOTE — Telephone Encounter (Signed)
Patient aware and states that she ill get them done Monday because she already ate.

## 2020-02-13 NOTE — Telephone Encounter (Signed)
Patient has appointment with you on Monday.  Had labs done from another provider on 12/22/19- please review labs in chart and advise if she will need lab work done on Monday

## 2020-02-13 NOTE — Telephone Encounter (Signed)
Lipid panel reordered.  She has A1c and thyroid panel in place from previous checkup.  Please have her get all 3 done

## 2020-02-16 ENCOUNTER — Ambulatory Visit: Payer: Medicare Other | Admitting: Family Medicine

## 2020-02-27 DIAGNOSIS — L509 Urticaria, unspecified: Secondary | ICD-10-CM | POA: Diagnosis not present

## 2020-03-23 ENCOUNTER — Other Ambulatory Visit: Payer: Self-pay

## 2020-03-23 ENCOUNTER — Other Ambulatory Visit: Payer: Medicare Other

## 2020-03-23 DIAGNOSIS — E785 Hyperlipidemia, unspecified: Secondary | ICD-10-CM

## 2020-03-23 LAB — BAYER DCA HB A1C WAIVED: HB A1C (BAYER DCA - WAIVED): 5.8 % (ref <7.0)

## 2020-03-24 LAB — THYROID PANEL WITH TSH
Free Thyroxine Index: 1.4 (ref 1.2–4.9)
T3 Uptake Ratio: 26 % (ref 24–39)
T4, Total: 5.3 ug/dL (ref 4.5–12.0)
TSH: 2.79 u[IU]/mL (ref 0.450–4.500)

## 2020-03-24 LAB — LIPID PANEL
Chol/HDL Ratio: 2.3 ratio (ref 0.0–4.4)
Cholesterol, Total: 125 mg/dL (ref 100–199)
HDL: 54 mg/dL (ref >39)
LDL Chol Calc (NIH): 41 mg/dL (ref 0–99)
Triglycerides: 189 mg/dL — ABNORMAL HIGH (ref 0–149)
VLDL Cholesterol Cal: 30 mg/dL (ref 5–40)

## 2020-03-29 ENCOUNTER — Encounter: Payer: Self-pay | Admitting: Family Medicine

## 2020-03-29 ENCOUNTER — Ambulatory Visit (INDEPENDENT_AMBULATORY_CARE_PROVIDER_SITE_OTHER): Payer: Medicare Other

## 2020-03-29 ENCOUNTER — Telehealth: Payer: Self-pay

## 2020-03-29 ENCOUNTER — Other Ambulatory Visit: Payer: Self-pay

## 2020-03-29 ENCOUNTER — Ambulatory Visit (INDEPENDENT_AMBULATORY_CARE_PROVIDER_SITE_OTHER): Payer: Medicare Other | Admitting: Family Medicine

## 2020-03-29 VITALS — BP 134/73 | HR 72 | Ht 66.0 in | Wt 183.0 lb

## 2020-03-29 DIAGNOSIS — R1031 Right lower quadrant pain: Secondary | ICD-10-CM

## 2020-03-29 DIAGNOSIS — G8929 Other chronic pain: Secondary | ICD-10-CM

## 2020-03-29 DIAGNOSIS — E785 Hyperlipidemia, unspecified: Secondary | ICD-10-CM | POA: Diagnosis not present

## 2020-03-29 DIAGNOSIS — M545 Low back pain, unspecified: Secondary | ICD-10-CM

## 2020-03-29 DIAGNOSIS — M1611 Unilateral primary osteoarthritis, right hip: Secondary | ICD-10-CM

## 2020-03-29 DIAGNOSIS — M5136 Other intervertebral disc degeneration, lumbar region: Secondary | ICD-10-CM

## 2020-03-29 NOTE — Progress Notes (Signed)
Subjective: CC: Follow-up  cholesterol PCP: Janora Norlander, DO HGD:JMEQAS C Weir is a 75 y.o. female presenting to clinic today for:  1.  Hyperlipidemia Patient is compliant with her Crestor 1/2 tablet three times weekly and Repatha.  She was unable to continue the Lovaza due to abdominal pain.  She denies any chest pain, shortness breath, nausea or vomiting.  2.  Right groin pain Patient reports about a 1 year history of right-sided groin pain.  The seem to start after she pivoted abnormally trying not to drop something of her hands.  No pop but she notes that she has had ongoing right low back pain, right inner inguinal pain and intermittent right lower extremity buckling.  Denies any sensory changes or changes in bladder or bowel habits.  Not currently taking any medications for this   ROS: Per HPI  Allergies  Allergen Reactions  . Penicillins Hives  . Sulfa Antibiotics   . Sulfamethoxazole Itching  . Lipitor [Atorvastatin] Diarrhea  . Crestor [Rosuvastatin Calcium] Other (See Comments)    Myalgias    Past Medical History:  Diagnosis Date  . Carotid artery stenosis   . Cataract   . Diverticulosis   . Fractured hand    right  . Hyperlipidemia   . Osteopenia   . Precancerous skin lesion 2016    Current Outpatient Medications:  .  aspirin EC 81 MG tablet, Take 81 mg by mouth daily., Disp: , Rfl:  .  Calcium 500-125 MG-UNIT TABS, 1 tablet. 1 tablet daily., Disp: , Rfl:  .  Cholecalciferol (VITAMIN D3) 2000 units capsule, 1 capsule., Disp: , Rfl:  .  Evolocumab (REPATHA SURECLICK) 341 MG/ML SOAJ, INJECT 1 DOSE SUBCUTANEOUSLY EVERY 14 DAYS, Disp: 6 mL, Rfl: 1 .  omega-3 acid ethyl esters (LOVAZA) 1 g capsule, Take 2 capsules (2 g total) by mouth 2 (two) times daily., Disp: 120 capsule, Rfl: 1 .  rosuvastatin (CRESTOR) 5 MG tablet, TAKE 1/2 (ONE-HALF) TABLET BY MOUTH THREE TIMES A WEEK, Disp: 18 tablet, Rfl: 3 Social History   Socioeconomic History  . Marital  status: Widowed    Spouse name: Not on file  . Number of children: 2  . Years of education: 36  . Highest education level: High school graduate  Occupational History  . Occupation: retired  Tobacco Use  . Smoking status: Never Smoker  . Smokeless tobacco: Never Used  Vaping Use  . Vaping Use: Never used  Substance and Sexual Activity  . Alcohol use: No  . Drug use: No  . Sexual activity: Not Currently  Other Topics Concern  . Not on file  Social History Narrative  . Not on file   Social Determinants of Health   Financial Resource Strain: Not on file  Food Insecurity: Not on file  Transportation Needs: No Transportation Needs  . Lack of Transportation (Medical): No  . Lack of Transportation (Non-Medical): No  Physical Activity: Sufficiently Active  . Days of Exercise per Week: 5 days  . Minutes of Exercise per Session: 30 min  Stress: Not on file  Social Connections: Not on file  Intimate Partner Violence: Not At Risk  . Fear of Current or Ex-Partner: No  . Emotionally Abused: No  . Physically Abused: No  . Sexually Abused: No   Family History  Problem Relation Age of Onset  . Heart disease Mother   . Heart disease Father   . Cancer Sister        breast  .  Hyperlipidemia Sister   . Diabetes Sister   . Cancer Sister        bone  . Alzheimer's disease Sister   . Heart disease Brother   . CAD Brother   . Cancer Sister        breast  . Kidney disease Sister   . Heart disease Brother   . Hypertension Brother   . Alzheimer's disease Brother   . Kidney disease Brother        2018 starting dialysis    Objective: Office vital signs reviewed. BP 134/73   Pulse 72   Ht 5\' 6"  (1.676 m)   Wt 183 lb (83 kg)   SpO2 96%   BMI 29.54 kg/m   Physical Examination:  General: Awake, alert, well nourished, No acute distress HEENT: Normal, sclera white Cardio: regular rate and rhythm, S1S2 heard, no murmurs appreciated Pulm: clear to auscultation bilaterally, no  wheezes, rhonchi or rales; normal work of breathing on room air Extremities: warm, well perfused, No edema, cyanosis or clubbing; +2 pulses bilaterally MSK: normal gait and station  Lumbar spine: No midline tenderness palpation but she does have lumbar sacral paraspinal muscle tenderness palpation along the right side.  There are no palpable bony abnormalities in this area.    Right hip: No pain with FADIR or FABER.  No point tenderness to palpation along the trochanteric bursa.  She does have reproducible groin tenderness palpation. Neuro: 5/5 LE Strength and light touch sensation grossly intact   Assessment/ Plan: 75 y.o. female   Chronic right-sided low back pain without sciatica - Plan: DG Lumbar Spine 2-3 Views  Right groin pain - Plan: DG Hip Unilat W OR W/O Pelvis 2-3 Views Right  Hyperlipidemia LDL goal <08  Uncertain etiology.  Possibly multifactorial and therefore I have ordered both plain films of her low back and right hip.  We will plan pending these results.  Given longevity she most certainly may need formal physical therapy versus referral to orthopedist for further management.  We reviewed her cholesterol results which did show a small elevation in her triglycerides.  Continue current regimens.  No orders of the defined types were placed in this encounter.  No orders of the defined types were placed in this encounter.  **Addendum: Formal review by radiology showed diffuse degenerative changes within the lower lumbar spine and the right hip.  I do think that she would benefit from eval by orthopedics particularly given ongoing pain.  I left a detailed voicemail as per patient's request on her cell phone.  We will wait to see who she would like to see as far as orthopedist goes and place referral accordingly  Janora Norlander, [place] [phone]

## 2020-03-29 NOTE — Telephone Encounter (Signed)
Pt called requesting that Dr Lajuana Ripple call her with her xray results when available.  [phone]

## 2020-04-19 DIAGNOSIS — M5136 Other intervertebral disc degeneration, lumbar region: Secondary | ICD-10-CM | POA: Diagnosis not present

## 2020-04-19 DIAGNOSIS — M1611 Unilateral primary osteoarthritis, right hip: Secondary | ICD-10-CM | POA: Diagnosis not present

## 2020-05-24 ENCOUNTER — Other Ambulatory Visit: Payer: Self-pay | Admitting: Internal Medicine

## 2020-07-28 ENCOUNTER — Other Ambulatory Visit: Payer: Self-pay

## 2020-07-28 ENCOUNTER — Other Ambulatory Visit (HOSPITAL_COMMUNITY): Payer: Self-pay | Admitting: Physician Assistant

## 2020-07-28 ENCOUNTER — Ambulatory Visit (HOSPITAL_COMMUNITY)
Admission: RE | Admit: 2020-07-28 | Discharge: 2020-07-28 | Disposition: A | Discharge status: Home / Self Care | Payer: Medicare Other | Source: Ambulatory Visit | Attending: Cardiovascular Disease | Admitting: Cardiovascular Disease

## 2020-07-28 DIAGNOSIS — I6523 Occlusion and stenosis of bilateral carotid arteries: Secondary | ICD-10-CM | POA: Insufficient documentation

## 2020-07-30 ENCOUNTER — Encounter (HOSPITAL_COMMUNITY): Payer: Medicare Other

## 2020-08-16 ENCOUNTER — Telehealth (INDEPENDENT_AMBULATORY_CARE_PROVIDER_SITE_OTHER): Payer: Medicare Other | Admitting: Nurse Practitioner

## 2020-08-16 DIAGNOSIS — R300 Dysuria: Secondary | ICD-10-CM | POA: Diagnosis not present

## 2020-08-16 LAB — URINALYSIS, ROUTINE W REFLEX MICROSCOPIC
Bilirubin, UA: NEGATIVE
Glucose, UA: NEGATIVE
Ketones, UA: NEGATIVE
Leukocytes,UA: NEGATIVE
Nitrite, UA: NEGATIVE
Protein,UA: NEGATIVE
RBC, UA: NEGATIVE
Specific Gravity, UA: 1.01 (ref 1.005–1.030)
Urobilinogen, Ur: 0.2 mg/dL (ref 0.2–1.0)
pH, UA: 5.5 (ref 5.0–7.5)

## 2020-08-16 NOTE — Progress Notes (Signed)
   Virtual Visit  Note Due to COVID-19 pandemic this visit was conducted virtually. This visit type was conducted due to national recommendations for restrictions regarding the COVID-19 Pandemic (e.g. social distancing, sheltering in place) in an effort to limit this patient's exposure and mitigate transmission in our community. All issues noted in this document were discussed and addressed.  A physical exam was not performed with this format.  I connected with Carol [name] on 08/18/20 at 3 PM by telephone and verified that I am speaking with the correct person using two identifiers. Carol [name] is currently located at home during visit. The provider, Ivy Lynn, NP is located in their office at time of visit.  I discussed the limitations, risks, security and privacy concerns of performing an evaluation and management service by telephone and the availability of in person appointments. I also discussed with the patient that there may be a patient responsible charge related to this service. The patient expressed understanding and agreed to proceed.   History and Present Illness:  Urinary Tract Infection  This is a new problem. Episode onset: in the past 4 days. The problem occurs intermittently. The problem has been unchanged. The patient is experiencing no pain. There has been no fever. Associated symptoms include urgency. Pertinent negatives include no chills, flank pain, frequency or nausea. She has tried nothing for the symptoms.     Review of Systems  Constitutional:  Negative for chills.  Gastrointestinal:  Negative for nausea.  Genitourinary:  Positive for urgency. Negative for flank pain and frequency.  Skin:  Negative for rash.  All other systems reviewed and are negative.   Observations/Objective: Televisit patient not in distress  Assessment and Plan: Urinalysis completed results pending.  Follow Up Instructions: Follow-up with worsening symptoms    I discussed  the assessment and treatment plan with the patient. The patient was provided an opportunity to ask questions and all were answered. The patient agreed with the plan and demonstrated an understanding of the instructions.   The patient was advised to call back or seek an in-person evaluation if the symptoms worsen or if the condition fails to improve as anticipated.  The above assessment and management plan was discussed with the patient. The patient verbalized understanding of and has agreed to the management plan. Patient is aware to call the clinic if symptoms persist or worsen. Patient is aware when to return to the clinic for a follow-up visit. Patient educated on when it is appropriate to go to the emergency department.   Time call ended: 3:11 PM  I provided 11 minutes of  non face-to-face time during this encounter.    Ivy Lynn, NP

## 2020-08-18 ENCOUNTER — Encounter: Payer: Self-pay | Admitting: Nurse Practitioner

## 2020-08-18 NOTE — Assessment & Plan Note (Signed)
Urinalysis completed results pending.

## 2020-09-21 ENCOUNTER — Other Ambulatory Visit: Payer: Self-pay | Admitting: Family Medicine

## 2020-09-21 ENCOUNTER — Telehealth: Payer: Self-pay | Admitting: Family Medicine

## 2020-09-21 DIAGNOSIS — E785 Hyperlipidemia, unspecified: Secondary | ICD-10-CM

## 2020-09-21 NOTE — Telephone Encounter (Signed)
Done. Come fasting for cholesterol

## 2020-09-21 NOTE — Telephone Encounter (Signed)
Pt aware.

## 2020-09-22 ENCOUNTER — Other Ambulatory Visit: Payer: Self-pay

## 2020-09-22 ENCOUNTER — Other Ambulatory Visit: Payer: Medicare Other

## 2020-09-22 DIAGNOSIS — E785 Hyperlipidemia, unspecified: Secondary | ICD-10-CM

## 2020-09-22 LAB — LIPID PANEL
Chol/HDL Ratio: 3.3 ratio (ref 0.0–4.4)
Cholesterol, Total: 156 mg/dL (ref 100–199)
HDL: 47 mg/dL (ref >39)
LDL Chol Calc (NIH): 70 mg/dL (ref 0–99)
Triglycerides: 238 mg/dL — ABNORMAL HIGH (ref 0–149)
VLDL Cholesterol Cal: 39 mg/dL (ref 5–40)

## 2020-09-22 LAB — CMP14+EGFR
ALT: 13 IU/L (ref 0–32)
AST: 16 IU/L (ref 0–40)
Albumin/Globulin Ratio: 1.7 (ref 1.2–2.2)
Albumin: 4.5 g/dL (ref 3.7–4.7)
Alkaline Phosphatase: 73 IU/L (ref 44–121)
BUN/Creatinine Ratio: 18 (ref 12–28)
BUN: 17 mg/dL (ref 8–27)
Bilirubin Total: 0.6 mg/dL (ref 0.0–1.2)
CO2: 24 mmol/L (ref 20–29)
Calcium: 9.1 mg/dL (ref 8.7–10.3)
Chloride: 105 mmol/L (ref 96–106)
Creatinine, Ser: 0.96 mg/dL (ref 0.57–1.00)
Globulin, Total: 2.6 g/dL (ref 1.5–4.5)
Glucose: 106 mg/dL — ABNORMAL HIGH (ref 65–99)
Potassium: 5 mmol/L (ref 3.5–5.2)
Sodium: 143 mmol/L (ref 134–144)
Total Protein: 7.1 g/dL (ref 6.0–8.5)
eGFR: 61 mL/min/{1.73_m2} (ref >59)

## 2020-09-27 ENCOUNTER — Encounter: Payer: Self-pay | Admitting: Family Medicine

## 2020-09-27 ENCOUNTER — Ambulatory Visit (INDEPENDENT_AMBULATORY_CARE_PROVIDER_SITE_OTHER): Payer: Medicare Other | Admitting: Family Medicine

## 2020-09-27 ENCOUNTER — Other Ambulatory Visit: Payer: Self-pay

## 2020-09-27 VITALS — BP 146/71 | HR 59 | Temp 97.2°F | Ht 66.0 in | Wt 184.2 lb

## 2020-09-27 DIAGNOSIS — I6523 Occlusion and stenosis of bilateral carotid arteries: Secondary | ICD-10-CM

## 2020-09-27 DIAGNOSIS — R739 Hyperglycemia, unspecified: Secondary | ICD-10-CM

## 2020-09-27 DIAGNOSIS — E785 Hyperlipidemia, unspecified: Secondary | ICD-10-CM

## 2020-09-27 NOTE — Progress Notes (Signed)
Subjective: CC: HLD PCP: Janora Norlander, DO HYQ:[id number] C Modisette is a 75 y.o. female presenting to clinic today for:  1. HLD with bilateral carotid stenosis Compliant with Crestor a few days per week and Repatha.  Last OV with Dr Debara Pickett 11/2019.  She does admit that she is not as physically active as she normally is.  Typically she walks regularly but has not been doing this since her walking partner broke their hip.  She has been working daily in the garden, which is quite large.  She has not followed a strict diet but plans to switch things up since her labs were abnormal.  No chest pain, shortness of breath, dizziness.  No change in exercise tolerance     ROS: Per HPI  Allergies  Allergen Reactions   Penicillins Hives   Sulfa Antibiotics    Sulfamethoxazole Itching   Lipitor [Atorvastatin] Diarrhea   Crestor [Rosuvastatin Calcium] Other (See Comments)    Myalgias    Past Medical History:  Diagnosis Date   Carotid artery stenosis    Cataract    Diverticulosis    Fractured hand    right   Hyperlipidemia    Osteopenia    Precancerous skin lesion 2016    Current Outpatient Medications:    aspirin EC 81 MG tablet, Take 81 mg by mouth daily., Disp: , Rfl:    Calcium 500-125 MG-UNIT TABS, 1 tablet. 1 tablet daily., Disp: , Rfl:    Cholecalciferol (VITAMIN D3) 2000 units capsule, 1 capsule., Disp: , Rfl:    REPATHA SURECLICK 962 MG/ML SOAJ, INJECT 1 DOSE SUBCUTANEOUSLY EVERY 14 DAYS, Disp: 2 mL, Rfl: 11   rosuvastatin (CRESTOR) 5 MG tablet, TAKE 1/2 (ONE-HALF) TABLET BY MOUTH THREE TIMES A WEEK, Disp: 18 tablet, Rfl: 3 Social History   Socioeconomic History   Marital status: Widowed    Spouse name: Not on file   Number of children: 2   Years of education: 12   Highest education level: High school graduate  Occupational History   Occupation: retired  Tobacco Use   Smoking status: Never   Smokeless tobacco: Never  Vaping Use   Vaping Use: Never used  Substance  and Sexual Activity   Alcohol use: No   Drug use: No   Sexual activity: Not Currently  Other Topics Concern   Not on file  Social History Narrative   Not on file   Social Determinants of Health   Financial Resource Strain: Not on file  Food Insecurity: Not on file  Transportation Needs: No Transportation Needs   Lack of Transportation (Medical): No   Lack of Transportation (Non-Medical): No  Physical Activity: Sufficiently Active   Days of Exercise per Week: 5 days   Minutes of Exercise per Session: 30 min  Stress: Not on file  Social Connections: Not on file  Intimate Partner Violence: Not At Risk   Fear of Current or Ex-Partner: No   Emotionally Abused: No   Physically Abused: No   Sexually Abused: No   Family History  Problem Relation Age of Onset   Heart disease Mother    Heart disease Father    Cancer Sister        breast   Hyperlipidemia Sister    Diabetes Sister    Cancer Sister        bone   Alzheimer's disease Sister    Heart disease Brother    CAD Brother    Cancer Sister  breast   Kidney disease Sister    Heart disease Brother    Hypertension Brother    Alzheimer's disease Brother    Kidney disease Brother        2018 starting dialysis    Objective: Office vital signs reviewed. BP (!) 146/71   Pulse (!) 59   Temp (!) 97.2 F (36.2 C)   Ht $R'5\' 6"'ft$  (1.676 m)   Wt 184 lb 3.2 oz (83.6 kg)   SpO2 94%   BMI 29.73 kg/m   Physical Examination:  General: Awake, alert, well nourished, No acute distress HEENT: Normal; sclera white.  No carotid bruits Cardio: regular rate and rhythm, S1S2 heard, soft murmur appreciated at left sternal border.  Does not radiate to carotids Pulm: clear to auscultation bilaterally, no wheezes, rhonchi or rales; normal work of breathing on room air GI: soft, non-tender, non-distended, bowel sounds present x4, no hepatomegaly, no splenomegaly, no masses Extremities: warm, well perfused, No edema, cyanosis or clubbing;  +2 pulses bilaterally.  Varicose veins noted MSK: Normal gait and station  Recent Results (from the past 2160 hour(s))  Urinalysis, Routine w reflex microscopic     Status: None   Collection Time: 08/16/20  1:04 PM  Result Value Ref Range   Specific Gravity, UA 1.010 1.005 - 1.030   pH, UA 5.5 5.0 - 7.5   Color, UA Yellow Yellow   Appearance Ur Clear Clear   Leukocytes,UA Negative Negative   Protein,UA Negative Negative/Trace   Glucose, UA Negative Negative   Ketones, UA Negative Negative   RBC, UA Negative Negative   Bilirubin, UA Negative Negative   Urobilinogen, Ur 0.2 0.2 - 1.0 mg/dL   Nitrite, UA Negative Negative  CMP14+EGFR     Status: Abnormal   Collection Time: 09/22/20  8:05 AM  Result Value Ref Range   Glucose 106 (H) 65 - 99 mg/dL   BUN 17 8 - 27 mg/dL   Creatinine, Ser 0.96 0.57 - 1.00 mg/dL   eGFR 61 >59 mL/min/1.73   BUN/Creatinine Ratio 18 12 - 28   Sodium 143 134 - 144 mmol/L   Potassium 5.0 3.5 - 5.2 mmol/L   Chloride 105 96 - 106 mmol/L   CO2 24 20 - 29 mmol/L   Calcium 9.1 8.7 - 10.3 mg/dL   Total Protein 7.1 6.0 - 8.5 g/dL   Albumin 4.5 3.7 - 4.7 g/dL   Globulin, Total 2.6 1.5 - 4.5 g/dL   Albumin/Globulin Ratio 1.7 1.2 - 2.2   Bilirubin Total 0.6 0.0 - 1.2 mg/dL   Alkaline Phosphatase 73 44 - 121 IU/L   AST 16 0 - 40 IU/L   ALT 13 0 - 32 IU/L  Lipid panel     Status: Abnormal   Collection Time: 09/22/20  8:05 AM  Result Value Ref Range   Cholesterol, Total 156 100 - 199 mg/dL   Triglycerides 238 (H) 0 - 149 mg/dL   HDL 47 >39 mg/dL   VLDL Cholesterol Cal 39 5 - 40 mg/dL   LDL Chol Calc (NIH) 70 0 - 99 mg/dL   Chol/HDL Ratio 3.3 0.0 - 4.4 ratio    Comment:                                   T. Chol/HDL Ratio  Men  Women                               1/2 Avg.Risk  3.4    3.3                                   Avg.Risk  5.0    4.4                                2X Avg.Risk  9.6    7.1                                 3X Avg.Risk 23.4   11.0     Assessment/ Plan: 75 y.o. female   Hyperlipidemia LDL goal <70 - Plan: CMP14+EGFR, Lipid panel  Bilateral carotid artery stenosis - Plan: CMP14+EGFR, Lipid panel  Elevated serum glucose - Plan: Bayer DCA Hb A1c Waived  LDL is at goal but her triglycerides are elevated.  This seems to be related to diet and exercise.  She will be making some lifestyle modifications and we will plan to repeat lipid panel and CMP in 3 months.  I am going to add an A1c also given elevations in sugar noted on most recent panel.  Continue current medication regimen  No carotid bruits heard on exam today.  Carotid Doppler performed June 2022 was stable from previous checkup.  This is closely followed by her cardiologist.  No orders of the defined types were placed in this encounter.  No orders of the defined types were placed in this encounter.    Janora Norlander, DO [place] [phone]

## 2020-09-28 DIAGNOSIS — H524 Presbyopia: Secondary | ICD-10-CM | POA: Diagnosis not present

## 2020-11-05 ENCOUNTER — Other Ambulatory Visit: Payer: Self-pay

## 2020-11-05 ENCOUNTER — Telehealth: Payer: Self-pay | Admitting: Family Medicine

## 2020-11-05 MED ORDER — ROSUVASTATIN CALCIUM 5 MG PO TABS: ORAL_TABLET | ORAL | 3 refills | Status: DC

## 2020-11-05 NOTE — Telephone Encounter (Signed)
Refills sent to [facility] in [place]

## 2020-11-05 NOTE — Telephone Encounter (Signed)
  Prescription Request  11/05/2020  Is this a "Controlled Substance" medicine? no Have you seen your PCP in the last 2 weeks? Pt seen on 09/27/2020 for 6 month f/u with PCP If YES, route message to pool  -  If NO, patient needs to be scheduled for appointment.  What is the name of the medication or equipment?rosuvastatin (CRESTOR) 5 MG tablet Have you contacted your pharmacy to request a refill? yes  Which pharmacy would you like this sent to? [facility]    Patient notified that their request is being sent to the clinical staff for review and that they should receive a response within 2 business days.

## 2020-12-07 ENCOUNTER — Ambulatory Visit (INDEPENDENT_AMBULATORY_CARE_PROVIDER_SITE_OTHER): Payer: Medicare Other

## 2020-12-07 VITALS — Ht 66.0 in | Wt 175.0 lb

## 2020-12-07 DIAGNOSIS — Z0000 Encounter for general adult medical examination without abnormal findings: Secondary | ICD-10-CM

## 2020-12-07 NOTE — Progress Notes (Signed)
Subjective:   GISSELA BLOCH is a 75 y.o. female who presents for Medicare Annual (Subsequent) preventive examination.  Virtual Visit via Telephone Note  I connected with  [name] on 12/07/20 at  8:15 AM EDT by telephone and verified that I am speaking with the correct person using two identifiers.  Location: Patient: Home Provider: [facility] Persons participating in the virtual visit: patient/Nurse Health Advisor   I discussed the limitations, risks, security and privacy concerns of performing an evaluation and management service by telephone and the availability of in person appointments. The patient expressed understanding and agreed to proceed.  Interactive audio and video telecommunications were attempted between this nurse and patient, however failed, due to patient having technical difficulties OR patient did not have access to video capability.  We continued and completed visit with audio only.  Some vital signs may be absent or patient reported.   Matti Minney E Socorro Kanitz, LPN   Review of Systems     Cardiac Risk Factors include: advanced age (>43men, >78 women);dyslipidemia;family history of premature cardiovascular disease;Other (see comment), Risk factor comments: carotid stenosis     Objective:    Today's Vitals   12/07/20 0825  Weight: 175 lb (79.4 kg)  Height: 5\' 6"  (1.676 m)   Body mass index is 28.25 kg/m.  Advanced Directives 12/07/2020 12/04/2019 09/26/2019 12/03/2018 11/27/2017 02/03/2016 10/07/2014  Does Patient Have a Medical Advance Directive? No Yes Yes Yes Yes Yes Yes  Type of Advance Directive - [place];Living will [place];Living will Living will;Healthcare Power of [place];Living will [place];Living will [place];Living will  Does patient want to make changes to medical advance directive? - No - Patient declined - No - Patient declined No - Patient  declined No - Patient declined Yes - information given  Copy of [place] in Chart? - No - copy requested - No - copy requested No - copy requested No - copy requested No - copy requested  Would patient like information on creating a medical advance directive? No - Patient declined - - - No - Patient declined - -    Current Medications (verified) Outpatient Encounter Medications as of 12/07/2020  Medication Sig   aspirin EC 81 MG tablet Take 81 mg by mouth daily.   Calcium 500-125 MG-UNIT TABS 1 tablet. 1 tablet daily.   Cholecalciferol (VITAMIN D3) 2000 units capsule 1 capsule.   REPATHA SURECLICK 009 MG/ML SOAJ INJECT 1 DOSE SUBCUTANEOUSLY EVERY 14 DAYS   rosuvastatin (CRESTOR) 5 MG tablet TAKE 1/2 (ONE-HALF) TABLET BY MOUTH THREE TIMES A WEEK   No facility-administered encounter medications on file as of 12/07/2020.    Allergies (verified) Penicillins, Sulfa antibiotics, Sulfamethoxazole, Lipitor [atorvastatin], and Crestor [rosuvastatin calcium]   History: Past Medical History:  Diagnosis Date   Carotid artery stenosis    Cataract    Diverticulosis    Fractured hand    right   Hyperlipidemia    Osteopenia    Precancerous skin lesion 2016   Past Surgical History:  Procedure Laterality Date   CATARACT EXTRACTION W/ INTRAOCULAR LENS IMPLANT Bilateral    CHOLECYSTECTOMY  2009   EYE SURGERY     hand fracture surgery     Family History  Problem Relation Age of Onset   Heart disease Mother    Heart disease Father    Cancer Sister        breast   Hyperlipidemia  Sister    Diabetes Sister    Cancer Sister        bone   Alzheimer's disease Sister    Heart disease Brother    CAD Brother    Cancer Sister        breast   Kidney disease Sister    Heart disease Brother    Hypertension Brother    Alzheimer's disease Brother    Kidney disease Brother        2018 starting dialysis   Social History   Socioeconomic History   Marital status: Widowed     Spouse name: Not on file   Number of children: 2   Years of education: 12   Highest education level: High school graduate  Occupational History   Occupation: retired  Tobacco Use   Smoking status: Never   Smokeless tobacco: Never  Vaping Use   Vaping Use: Never used  Substance and Sexual Activity   Alcohol use: No   Drug use: No   Sexual activity: Not Currently  Other Topics Concern   Not on file  Social History Narrative   Lives alone. Has a basement   One child in [place], one in [place] Strain: Low Risk    Difficulty of Paying Living Expenses: Not hard at all  Food Insecurity: No Food Insecurity   Worried About Charity fundraiser in the Last Year: Never true   Arboriculturist in the Last Year: Never true  Transportation Needs: No Transportation Needs   Lack of Transportation (Medical): No   Lack of Transportation (Non-Medical): No  Physical Activity: Sufficiently Active   Days of Exercise per Week: 5 days   Minutes of Exercise per Session: 30 min  Stress: No Stress Concern Present   Feeling of Stress : Not at all  Social Connections: Moderately Integrated   Frequency of Communication with Friends and Family: More than three times a week   Frequency of Social Gatherings with Friends and Family: More than three times a week   Attends Religious Services: More than 4 times per year   Active Member of [employer] or Organizations: Yes   Attends Archivist Meetings: More than 4 times per year   Marital Status: Widowed    Tobacco Counseling Counseling given: Not Answered   Clinical Intake:  Pre-visit preparation completed: Yes  Pain : No/denies pain     BMI - recorded: 28.25 Nutritional Status: BMI 25 -29 Overweight Nutritional Risks: None Diabetes: No  How often do you need to have someone help you when you read instructions, pamphlets, or other written materials from your doctor or pharmacy?: 1 -  Never  Diabetic? no  Interpreter Needed?: No  Information entered by :: Len Azeez, LPN   Activities of Daily Living In your present state of health, do you have any difficulty performing the following activities: 12/07/2020  Hearing? Y  Comment wears hearing aids  Vision? N  Difficulty concentrating or making decisions? N  Walking or climbing stairs? N  Dressing or bathing? N  Doing errands, shopping? N  Preparing Food and eating ? N  Using the Toilet? N  In the past six months, have you accidently leaked urine? N  Do you have problems with loss of bowel control? N  Managing your Medications? N  Managing your Finances? N  Housekeeping or managing your Housekeeping? N  Some recent data might be hidden  Patient Care Team: Janora Norlander, DO as PCP - General (Family Medicine) Debara Pickett Nadean Corwin, MD as PCP - Cardiology (Cardiology) Lavonna Monarch, MD as Consulting Physician (Dermatology) Luberta Mutter, MD as Consulting Physician (Ophthalmology) Ronald Lobo, MD as Consulting Physician (Gastroenterology) Debara Pickett Nadean Corwin, MD as Consulting Physician (Cardiology)  Indicate any recent Medical Services you may have received from other than [facility] providers in the past year (date may be approximate).     Assessment:   This is a routine wellness examination for Mykenzie.  Hearing/Vision screen Hearing Screening - Comments:: Wears hearing aids - from Dr Bill Salinas in [place]:: Denies vision difficulties - up to date with annual eye exams with Dr Valetta Close at [place] issues and exercise activities discussed: Current Exercise Habits: Home exercise routine, Type of exercise: walking, Time (Minutes): 30, Frequency (Times/Week): 5, Weekly Exercise (Minutes/Week): 150, Intensity: Mild, Exercise limited by: orthopedic condition(s)   Goals Addressed             This Visit's Progress    DIET - INCREASE WATER INTAKE   On  track    Try to drink 6-8 glasses of water daily.     Patient Stated   On track    Walk more as her plantar fascitis improves       Depression Screen PHQ 2/9 Scores 12/07/2020 09/27/2020 03/29/2020 12/04/2019 11/18/2019 11/17/2019 06/30/2019  PHQ - 2 Score 0 0 0 0 0 0 0  PHQ- 9 Score - - - - - 0 -    Fall Risk Fall Risk  12/07/2020 09/27/2020 03/29/2020 12/04/2019 11/18/2019  Falls in the past year? 0 0 0 0 0  Number falls in past yr: 0 - - - -  Injury with Fall? 0 - - - -  Comment - - - - -  Risk for fall due to : Orthopedic patient - - - -  Follow up Falls prevention discussed - - - -  Comment - - - - -    FALL RISK PREVENTION PERTAINING TO THE HOME:  Any stairs in or around the home? Yes  If so, are there any without handrails? No  Home free of loose throw rugs in walkways, pet beds, electrical cords, etc? Yes  Adequate lighting in your home to reduce risk of falls? Yes   ASSISTIVE DEVICES UTILIZED TO PREVENT FALLS:  Life alert? No  Use of a cane, walker or w/c? No  Grab bars in the bathroom? No  Shower chair or bench in shower? No  Elevated toilet seat or a handicapped toilet? No   TIMED UP AND GO:  Was the test performed? No . Telephonic visit  Cognitive Function: Normal cognitive status assessed by direct observation by this Nurse Health Advisor. No abnormalities found.   MMSE - Mini Mental State Exam 11/27/2017 02/03/2016 02/03/2016 10/07/2014  Orientation to time 5 5 5 5   Orientation to Place 5 5 5 5   Registration 3 3 - 3  Attention/ Calculation 5 5 5 5   Recall 3 2 - 3  Language- name 2 objects 2 2 - 2  Language- repeat 1 1 - 1  Language- follow 3 step command 3 3 - 3  Language- read & follow direction 1 1 - 1  Write a sentence 1 1 - 1  Copy design 1 1 - 1  Total score 30 29 - 30     6CIT Screen 12/04/2019 12/03/2018  What Year? 0 points 0 points  What month? 0 points 0 points  What time? 0 points 0 points  Count back from 20 0 points 0 points  Months in reverse  0 points 0 points  Repeat phrase 0 points 0 points  Total Score 0 0    Immunizations Immunization History  Administered Date(s) Administered   Influenza,inj,Quad PF,6+ Mos 11/26/2012, 11/26/2013, 12/04/2014   Moderna Sars-Covid-2 Vaccination 04/10/2019, 05/09/2019   Tdap 02/27/2006   Zoster, Live 12/19/2013    TDAP status: Due, Education has been provided regarding the importance of this vaccine. Advised may receive this vaccine at local pharmacy or Health Dept. Aware to provide a copy of the vaccination record if obtained from local pharmacy or Health Dept. Verbalized acceptance and understanding.  Flu Vaccine status: Declined, Education has been provided regarding the importance of this vaccine but patient still declined. Advised may receive this vaccine at local pharmacy or Health Dept. Aware to provide a copy of the vaccination record if obtained from local pharmacy or Health Dept. Verbalized acceptance and understanding.  Pneumococcal vaccine status: Declined,  Education has been provided regarding the importance of this vaccine but patient still declined. Advised may receive this vaccine at local pharmacy or Health Dept. Aware to provide a copy of the vaccination record if obtained from local pharmacy or Health Dept. Verbalized acceptance and understanding.   Covid-19 vaccine status: Declined, Education has been provided regarding the importance of this vaccine but patient still declined. Advised may receive this vaccine at local pharmacy or Health Dept.or vaccine clinic. Aware to provide a copy of the vaccination record if obtained from local pharmacy or Health Dept. Verbalized acceptance and understanding.  Qualifies for Shingles Vaccine? Yes   Zostavax completed Yes   Shingrix Completed?: No.    Education has been provided regarding the importance of this vaccine. Patient has been advised to call insurance company to determine out of pocket expense if they have not yet received this  vaccine. Advised may also receive vaccine at local pharmacy or Health Dept. Verbalized acceptance and understanding.  Screening Tests Health Maintenance  Topic Date Due   COVID-19 Vaccine (3 - Moderna risk series) 12/23/2020 (Originally 06/06/2019)   Zoster Vaccines- Shingrix (1 of 2) 12/28/2020 (Originally 09/25/1964)   MAMMOGRAM  02/12/2021   DEXA SCAN  05/14/2021   Hepatitis C Screening  Completed   HPV VACCINES  Aged Out   INFLUENZA VACCINE  Discontinued   TETANUS/TDAP  Discontinued    Health Maintenance  There are no preventive care reminders to display for this patient.   Colorectal cancer screening: Type of screening: Colonoscopy. Completed 07/12/2010. Repeat every 5 years - report recommended Sigmoidoscopy in 5 years, but patient declines further testing  Mammogram status: Completed 02/13/2020. Repeat every year  Bone Density status: Completed 05/15/2019. Results reflect: Bone density results: NORMAL. Repeat every 2 years.  Lung Cancer Screening: (Low Dose CT Chest recommended if Age 30-80 years, 30 pack-year currently smoking OR have quit w/in 15years.) does not qualify.   Additional Screening:  Hepatitis C Screening: does qualify; Completed 10/08/2014  Vision Screening: Recommended annual ophthalmology exams for early detection of glaucoma and other disorders of the eye. Is the patient up to date with their annual eye exam?  Yes  Who is the provider or what is the name of the office in which the patient attends annual eye exams? [facility] in [place] If pt is not established with a provider, would they like to be referred to a provider to establish care? No .  Dental Screening: Recommended annual dental exams for proper oral hygiene  Community Resource Referral / Chronic Care Management: CRR required this visit?  No   CCM required this visit?  No      Plan:     I have personally reviewed and noted the following in the patient's chart:   Medical and social  history Use of alcohol, tobacco or illicit drugs  Current medications and supplements including opioid prescriptions.  Functional ability and status Nutritional status Physical activity Advanced directives List of other physicians Hospitalizations, surgeries, and ER visits in previous 12 months Vitals Screenings to include cognitive, depression, and falls Referrals and appointments  In addition, I have reviewed and discussed with patient certain preventive protocols, quality metrics, and best practice recommendations. A written personalized care plan for preventive services as well as general preventive health recommendations were provided to patient.     Sandrea Hammond, LPN   [id number]   Nurse Notes: None

## 2020-12-07 NOTE — Patient Instructions (Signed)
Carol [name] , Thank you for taking time to come for your Medicare Wellness Visit. I appreciate your ongoing commitment to your health goals. Please review the following plan we discussed and let me know if I can assist you in the future.   Screening recommendations/referrals: Colonoscopy: Done 05/25/2011 - report recommended repeat sigmoidoscopy in 5 years (discuss with GI or Dr Lajuana Ripple) Mammogram: Done 02/13/2020 - Repeat annually  Bone Density: Done 05/15/2019 - Repeat every 2 years  Recommended yearly ophthalmology/optometry visit for glaucoma screening and checkup Recommended yearly dental visit for hygiene and checkup  Vaccinations: Influenza vaccine: Declined Pneumococcal vaccine: Declined Tdap vaccine: Done 2008 - Repeat in 10 years *declined Shingles vaccine: Zostavax done 2015 - declines shingrix   Covid-19: Done 04/10/2019 & 05/09/2019 - declines boosters  Advanced directives: Please bring a copy of your health care power of attorney and living will to the office to be added to your chart at your convenience.   Conditions/risks identified: Aim for 30 minutes of exercise or brisk walking each day, drink 6-8 glasses of water and eat lots of fruits and vegetables.   Next appointment: Follow up in one year for your annual wellness visit    Preventive Care 65 Years and Older, Female Preventive care refers to lifestyle choices and visits with your health care provider that can promote health and wellness. What does preventive care include? A yearly physical exam. This is also called an annual well check. Dental exams once or twice a year. Routine eye exams. Ask your health care provider how often you should have your eyes checked. Personal lifestyle choices, including: Daily care of your teeth and gums. Regular physical activity. Eating a healthy diet. Avoiding tobacco and drug use. Limiting alcohol use. Practicing safe sex. Taking low-dose aspirin every day. Taking vitamin  and mineral supplements as recommended by your health care provider. What happens during an annual well check? The services and screenings done by your health care provider during your annual well check will depend on your age, overall health, lifestyle risk factors, and family history of disease. Counseling  Your health care provider may ask you questions about your: Alcohol use. Tobacco use. Drug use. Emotional well-being. Home and relationship well-being. Sexual activity. Eating habits. History of falls. Memory and ability to understand (cognition). Work and work Statistician. Reproductive health. Screening  You may have the following tests or measurements: Height, weight, and BMI. Blood pressure. Lipid and cholesterol levels. These may be checked every 5 years, or more frequently if you are over 77 years old. Skin check. Lung cancer screening. You may have this screening every year starting at age 43 if you have a 30-pack-year history of smoking and currently smoke or have quit within the past 15 years. Fecal occult blood test (FOBT) of the stool. You may have this test every year starting at age 21. Flexible sigmoidoscopy or colonoscopy. You may have a sigmoidoscopy every 5 years or a colonoscopy every 10 years starting at age 53. Hepatitis C blood test. Hepatitis B blood test. Sexually transmitted disease (STD) testing. Diabetes screening. This is done by checking your blood sugar (glucose) after you have not eaten for a while (fasting). You may have this done every 1-3 years. Bone density scan. This is done to screen for osteoporosis. You may have this done starting at age 10. Mammogram. This may be done every 1-2 years. Talk to your health care provider about how often you should have regular mammograms. Talk with your health care  provider about your test results, treatment options, and if necessary, the need for more tests. Vaccines  Your health care provider may recommend  certain vaccines, such as: Influenza vaccine. This is recommended every year. Tetanus, diphtheria, and acellular pertussis (Tdap, Td) vaccine. You may need a Td booster every 10 years. Zoster vaccine. You may need this after age 35. Pneumococcal 13-valent conjugate (PCV13) vaccine. One dose is recommended after age 55. Pneumococcal polysaccharide (PPSV23) vaccine. One dose is recommended after age 61. Talk to your health care provider about which screenings and vaccines you need and how often you need them. This information is not intended to replace advice given to you by your health care provider. Make sure you discuss any questions you have with your health care provider. Document Released: 03/12/2015 Document Revised: 11/03/2015 Document Reviewed: 12/15/2014 Elsevier Interactive Patient Education  2017 [place] Prevention in the Home Falls can cause injuries. They can happen to people of all ages. There are many things you can do to make your home safe and to help prevent falls. What can I do on the outside of my home? Regularly fix the edges of walkways and driveways and fix any cracks. Remove anything that might make you trip as you walk through a door, such as a raised step or threshold. Trim any bushes or trees on the path to your home. Use bright outdoor lighting. Clear any walking paths of anything that might make someone trip, such as rocks or tools. Regularly check to see if handrails are loose or broken. Make sure that both sides of any steps have handrails. Any raised decks and porches should have guardrails on the edges. Have any leaves, snow, or ice cleared regularly. Use sand or salt on walking paths during winter. Clean up any spills in your garage right away. This includes oil or grease spills. What can I do in the bathroom? Use night lights. Install grab bars by the toilet and in the tub and shower. Do not use towel bars as grab bars. Use non-skid mats or  decals in the tub or shower. If you need to sit down in the shower, use a plastic, non-slip stool. Keep the floor dry. Clean up any water that spills on the floor as soon as it happens. Remove soap buildup in the tub or shower regularly. Attach bath mats securely with double-sided non-slip rug tape. Do not have throw rugs and other things on the floor that can make you trip. What can I do in the bedroom? Use night lights. Make sure that you have a light by your bed that is easy to reach. Do not use any sheets or blankets that are too big for your bed. They should not hang down onto the floor. Have a firm chair that has side arms. You can use this for support while you get dressed. Do not have throw rugs and other things on the floor that can make you trip. What can I do in the kitchen? Clean up any spills right away. Avoid walking on wet floors. Keep items that you use a lot in easy-to-reach places. If you need to reach something above you, use a strong step stool that has a grab bar. Keep electrical cords out of the way. Do not use floor polish or wax that makes floors slippery. If you must use wax, use non-skid floor wax. Do not have throw rugs and other things on the floor that can make you trip. What can I  do with my stairs? Do not leave any items on the stairs. Make sure that there are handrails on both sides of the stairs and use them. Fix handrails that are broken or loose. Make sure that handrails are as long as the stairways. Check any carpeting to make sure that it is firmly attached to the stairs. Fix any carpet that is loose or worn. Avoid having throw rugs at the top or bottom of the stairs. If you do have throw rugs, attach them to the floor with carpet tape. Make sure that you have a light switch at the top of the stairs and the bottom of the stairs. If you do not have them, ask someone to add them for you. What else can I do to help prevent falls? Wear shoes that: Do not  have high heels. Have rubber bottoms. Are comfortable and fit you well. Are closed at the toe. Do not wear sandals. If you use a stepladder: Make sure that it is fully opened. Do not climb a closed stepladder. Make sure that both sides of the stepladder are locked into place. Ask someone to hold it for you, if possible. Clearly mark and make sure that you can see: Any grab bars or handrails. First and last steps. Where the edge of each step is. Use tools that help you move around (mobility aids) if they are needed. These include: Canes. Walkers. Scooters. Crutches. Turn on the lights when you go into a dark area. Replace any light bulbs as soon as they burn out. Set up your furniture so you have a clear path. Avoid moving your furniture around. If any of your floors are uneven, fix them. If there are any pets around you, be aware of where they are. Review your medicines with your doctor. Some medicines can make you feel dizzy. This can increase your chance of falling. Ask your doctor what other things that you can do to help prevent falls. This information is not intended to replace advice given to you by your health care provider. Make sure you discuss any questions you have with your health care provider. Document Released: 12/10/2008 Document Revised: 07/22/2015 Document Reviewed: 03/20/2014 Elsevier Interactive Patient Education  2017 [employer].

## 2020-12-08 ENCOUNTER — Other Ambulatory Visit: Payer: Self-pay

## 2020-12-08 ENCOUNTER — Encounter: Payer: Self-pay | Admitting: Physician Assistant

## 2020-12-08 ENCOUNTER — Ambulatory Visit (INDEPENDENT_AMBULATORY_CARE_PROVIDER_SITE_OTHER): Payer: Medicare Other | Admitting: Physician Assistant

## 2020-12-08 DIAGNOSIS — D0439 Carcinoma in situ of skin of other parts of face: Secondary | ICD-10-CM | POA: Diagnosis not present

## 2020-12-08 DIAGNOSIS — C44311 Basal cell carcinoma of skin of nose: Secondary | ICD-10-CM

## 2020-12-08 DIAGNOSIS — L570 Actinic keratosis: Secondary | ICD-10-CM | POA: Diagnosis not present

## 2020-12-08 DIAGNOSIS — Z1283 Encounter for screening for malignant neoplasm of skin: Secondary | ICD-10-CM

## 2020-12-08 DIAGNOSIS — D485 Neoplasm of uncertain behavior of skin: Secondary | ICD-10-CM

## 2020-12-08 DIAGNOSIS — Z85828 Personal history of other malignant neoplasm of skin: Secondary | ICD-10-CM

## 2020-12-08 NOTE — Patient Instructions (Signed)

## 2020-12-08 NOTE — Progress Notes (Signed)
Follow-Up Visit   Subjective  Carol [name] is a 75 y.o. female who presents for the following: Annual Exam (Skin check patient has history of scc see chart [id number]).   The following portions of the chart were reviewed this encounter and updated as appropriate:  Tobacco  Allergies  Meds  Problems  Med Hx  Surg Hx  Fam Hx      Objective  Well appearing patient in no apparent distress; mood and affect are within normal limits.  A full examination was performed including scalp, head, eyes, ears, nose, lips, neck, chest, axillae, abdomen, back, buttocks, bilateral upper extremities, bilateral lower extremities, hands, feet, fingers, toes, fingernails, and toenails. All findings within normal limits unless otherwise noted below.  Dorsum of Nose Hyperkeratotic scale with pink base        Left Mid Forehead Hyperkeratotic scale with pink base        Left Buccal Cheek Hyperkeratotic scale with pink base        Dorsum of Nose, Left Buccal Cheek, Left Forehead, Right Ala Nasi, Right Malar Cheek Erythematous patches with gritty scale.      Assessment & Plan  Neoplasm of uncertain behavior of skin (3) Dorsum of Nose  Skin / nail biopsy Type of biopsy: tangential   Informed consent: discussed and consent obtained   Timeout: patient name, date of birth, surgical site, and procedure verified   Procedure prep:  Patient was prepped and draped in usual sterile fashion (Non sterile) Prep type:  Chlorhexidine Anesthesia: the lesion was anesthetized in a standard fashion   Anesthetic:  1% lidocaine w/ epinephrine 1-100,000 local infiltration Instrument used: flexible razor blade   Outcome: patient tolerated procedure well   Post-procedure details: wound care instructions given    Specimen 1 - Surgical pathology Differential Diagnosis: bcc vs scc  Check Margins: No  Left Mid Forehead  Skin / nail biopsy Type of biopsy: tangential   Informed consent: discussed  and consent obtained   Timeout: patient name, date of birth, surgical site, and procedure verified   Procedure prep:  Patient was prepped and draped in usual sterile fashion (Non sterile) Prep type:  Chlorhexidine Anesthesia: the lesion was anesthetized in a standard fashion   Anesthetic:  1% lidocaine w/ epinephrine 1-100,000 local infiltration Instrument used: flexible razor blade   Outcome: patient tolerated procedure well   Post-procedure details: wound care instructions given    Specimen 2 - Surgical pathology Differential Diagnosis: bcc vs scc  Check Margins: No  Left Buccal Cheek  Skin / nail biopsy Type of biopsy: tangential   Informed consent: discussed and consent obtained   Timeout: patient name, date of birth, surgical site, and procedure verified   Procedure prep:  Patient was prepped and draped in usual sterile fashion (Non sterile) Prep type:  Chlorhexidine Anesthesia: the lesion was anesthetized in a standard fashion   Anesthetic:  1% lidocaine w/ epinephrine 1-100,000 local infiltration Instrument used: flexible razor blade   Outcome: patient tolerated procedure well   Post-procedure details: wound care instructions given    Specimen 3 - Surgical pathology Differential Diagnosis: bcc vs scc  Check Margins: No  AK (actinic keratosis) (5) Right Ala Nasi; Dorsum of Nose; Right Malar Cheek; Left Buccal Cheek; Left Forehead  Destruction of lesion - Dorsum of Nose, Left Buccal Cheek, Left Forehead, Right Ala Nasi, Right Malar Cheek Complexity: simple   Destruction method: cryotherapy   Informed consent: discussed and consent obtained   Timeout:  patient name,  date of birth, surgical site, and procedure verified Lesion destroyed using liquid nitrogen: Yes   Cryotherapy cycles:  3 Outcome: patient tolerated procedure well with no complications      I, Tabbitha Janvrin, PA-C, have reviewed all documentation's for this visit.  The documentation on 12/08/20 for  the exam, diagnosis, procedures and orders are all accurate and complete.

## 2020-12-13 ENCOUNTER — Telehealth: Payer: Self-pay | Admitting: *Deleted

## 2020-12-13 NOTE — Telephone Encounter (Signed)
-----   Message from Warren Danes, [place] sent at 12/10/2020 12:31 PM EDT ----- 30

## 2020-12-13 NOTE — Telephone Encounter (Signed)
Pathology to patient-surgery appointment scheduled.  

## 2020-12-29 ENCOUNTER — Telehealth: Payer: Self-pay | Admitting: Internal Medicine

## 2020-12-29 DIAGNOSIS — I6523 Occlusion and stenosis of bilateral carotid arteries: Secondary | ICD-10-CM

## 2020-12-29 DIAGNOSIS — E781 Pure hyperglyceridemia: Secondary | ICD-10-CM

## 2020-12-29 DIAGNOSIS — E785 Hyperlipidemia, unspecified: Secondary | ICD-10-CM

## 2020-12-29 NOTE — Telephone Encounter (Signed)
Medication approved: Effective from 12/29/2020 through 12/29/2021

## 2020-12-29 NOTE — Telephone Encounter (Signed)
PA for repatha sureclick submitted via CMM (Key: [id number])  Patient due for 1 year visit - scheduler notified Fasting lipid panel ordered to be done before visit

## 2021-01-04 ENCOUNTER — Ambulatory Visit (INDEPENDENT_AMBULATORY_CARE_PROVIDER_SITE_OTHER): Payer: Medicare Other | Admitting: Physician Assistant

## 2021-01-04 ENCOUNTER — Other Ambulatory Visit: Payer: Self-pay

## 2021-01-04 ENCOUNTER — Encounter: Payer: Self-pay | Admitting: Physician Assistant

## 2021-01-04 DIAGNOSIS — D0439 Carcinoma in situ of skin of other parts of face: Secondary | ICD-10-CM | POA: Diagnosis not present

## 2021-01-04 DIAGNOSIS — C44311 Basal cell carcinoma of skin of nose: Secondary | ICD-10-CM

## 2021-01-04 DIAGNOSIS — D0430 Carcinoma in situ of skin of unspecified part of face: Secondary | ICD-10-CM

## 2021-01-04 NOTE — Patient Instructions (Signed)

## 2021-01-07 ENCOUNTER — Encounter: Payer: Self-pay | Admitting: Physician Assistant

## 2021-01-07 NOTE — Progress Notes (Signed)
   Follow-Up Visit   Subjective  Carol [name] is a 75 y.o. female who presents for the following: Procedure (Bcc nose, forehead scc, buccal cheek scc ).   The following portions of the chart were reviewed this encounter and updated as appropriate:  Tobacco  Allergies  Meds  Problems  Med Hx  Surg Hx  Fam Hx      Objective  Well appearing patient in no apparent distress; mood and affect are within normal limits.  A focused examination was performed including face. Relevant physical exam findings are noted in the Assessment and Plan.  Left Buccal Cheek Pink macule. Per ks correct code   left Mid Forehead Pink macule. Per ks correct code  dorsum of nose Pearly papule with arborizing vessels.    Assessment & Plan  Carcinoma in situ of skin of face, unspecified location (2) Left Buccal Cheek  Destruction of lesion Complexity: simple   Destruction method: electrodesiccation and curettage   Informed consent: discussed and consent obtained   Timeout:  patient name, date of birth, surgical site, and procedure verified Anesthesia: the lesion was anesthetized in a standard fashion   Anesthetic:  1% lidocaine w/ epinephrine 1-100,000 local infiltration Curettage performed in three different directions: Yes   Electrodesiccation performed over the curetted area: Yes   Curettage cycles:  3 Final wound size (cm):  1.7 Hemostasis achieved with:  ferric subsulfate Outcome: patient tolerated procedure well with no complications   Additional details:  Wound innoculated with 5 fluorouracil solution.  left Mid Forehead  Destruction of lesion Complexity: simple   Destruction method: electrodesiccation and curettage   Informed consent: discussed and consent obtained   Timeout:  patient name, date of birth, surgical site, and procedure verified Anesthesia: the lesion was anesthetized in a standard fashion   Anesthetic:  1% lidocaine w/ epinephrine 1-100,000 local  infiltration Curettage performed in three different directions: Yes   Curettage cycles:  3 Final wound size (cm):  2.1 Hemostasis achieved with:  ferric subsulfate Outcome: patient tolerated procedure well with no complications   Additional details:  Wound innoculated with 5 fluorouracil solution.  Basal cell carcinoma (BCC) of skin of nose dorsum of nose  Destruction of lesion Complexity: simple   Destruction method: electrodesiccation and curettage   Informed consent: discussed and consent obtained   Timeout:  patient name, date of birth, surgical site, and procedure verified Anesthesia: the lesion was anesthetized in a standard fashion   Anesthetic:  1% lidocaine w/ epinephrine 1-100,000 local infiltration Curettage performed in three different directions: Yes   Electrodesiccation performed over the curetted area: Yes   Curettage cycles:  3 Final wound size (cm):  2 Hemostasis achieved with:  ferric subsulfate Outcome: patient tolerated procedure well with no complications   Additional details:  Wound innoculated with 5 fluorouracil solution.    I, Khylin Gutridge, PA-C, have reviewed all documentation's for this visit.  The documentation on 01/07/21 for the exam, diagnosis, procedures and orders are all accurate and complete.

## 2021-01-17 ENCOUNTER — Ambulatory Visit (INDEPENDENT_AMBULATORY_CARE_PROVIDER_SITE_OTHER): Payer: Medicare Other | Admitting: Nurse Practitioner

## 2021-01-17 ENCOUNTER — Encounter: Payer: Self-pay | Admitting: Nurse Practitioner

## 2021-01-17 DIAGNOSIS — U071 COVID-19: Secondary | ICD-10-CM | POA: Insufficient documentation

## 2021-01-17 MED ORDER — MOLNUPIRAVIR EUA 200MG CAPSULE: 4.0000 | ORAL_CAPSULE | Freq: Two times a day (BID) | ORAL | 0 refills | Status: AC

## 2021-01-17 NOTE — Patient Instructions (Signed)
10 Things You Can Do to Manage Your COVID-19 Symptoms at Home ?If you have possible or confirmed COVID-19 ?Stay home except to get medical care. ?Monitor your symptoms carefully. If your symptoms get worse, call your healthcare provider immediately. ?Get rest and stay hydrated. ?If you have a medical appointment, call the healthcare provider ahead of time and tell them that you have or may have COVID-19. ?For medical emergencies, call 911 and notify the dispatch personnel that you have or may have COVID-19. ?Cover your cough and sneezes with a tissue or use the inside of your elbow. ?Wash your hands often with soap and water for at least 20 seconds or clean your hands with an alcohol-based hand sanitizer that contains at least 60% alcohol. ?As much as possible, stay in a specific room and away from other people in your home. Also, you should use a separate bathroom, if available. If you need to be around other people in or outside of the home, wear a mask. ?Avoid sharing personal items with other people in your household, like dishes, towels, and bedding. ?Clean all surfaces that are touched often, like counters, tabletops, and doorknobs. Use household cleaning sprays or wipes according to the label instructions. ?cdc.gov/coronavirus ?09/12/2019 ?This information is not intended to replace advice given to you by your health care provider. Make sure you discuss any questions you have with your health care provider. ?Document Revised: 11/05/2020 Document Reviewed: 11/05/2020 ?Elsevier Patient Education ? 2022 Else[employer] Inc[employer] ? ?

## 2021-01-17 NOTE — Progress Notes (Signed)
   Virtual Visit  Note Due to COVID-19 pandemic this visit was conducted virtually. This visit type was conducted due to national recommendations for restrictions regarding the COVID-19 Pandemic (e.g. social distancing, sheltering in place) in an effort to limit this patient's exposure and mitigate transmission in our community. All issues noted in this document were discussed and addressed.  A physical exam was not performed with this format.  I connected with Carol [name] on 01/17/21 at 1 PM by telephone and verified that I am speaking with the correct person using two identifiers. Carol [name] is currently located at home during visit. The provider, Ivy Lynn, NP is located in their office at time of visit.  I discussed the limitations, risks, security and privacy concerns of performing an evaluation and management service by telephone and the availability of in person appointments. I also discussed with the patient that there may be a patient responsible charge related to this service. The patient expressed understanding and agreed to proceed.   History and Present Illness:  URI  This is a new problem. The current episode started yesterday. The problem has been unchanged. There has been no fever. Associated symptoms include congestion, coughing and a sore throat. Pertinent negatives include no abdominal pain, ear pain, headaches or rash. She has tried nothing for the symptoms.     Review of Systems  Constitutional:  Positive for malaise/fatigue.  HENT:  Positive for congestion and sore throat. Negative for ear pain.   Respiratory:  Positive for cough.   Gastrointestinal:  Negative for abdominal pain.  Skin:  Negative for rash.  Neurological:  Negative for headaches.  All other systems reviewed and are negative.   Observations/Objective: Televisit patient not in distress.  Assessment and Plan: Take meds as prescribed - Use a cool mist humidifier  -Use saline nose sprays  frequently -Force fluids -For fever or aches or pains- take Tylenol or ibuprofen. -Patient tested positive for COVID-19 at home test -Molnupurivir RX sent to pharmacy Follow up with worsening unresolved symptoms   Follow Up Instructions: Follow-up with worsening unresolved symptoms.    I discussed the assessment and treatment plan with the patient. The patient was provided an opportunity to ask questions and all were answered. The patient agreed with the plan and demonstrated an understanding of the instructions.   The patient was advised to call back or seek an in-person evaluation if the symptoms worsen or if the condition fails to improve as anticipated.  The above assessment and management plan was discussed with the patient. The patient verbalized understanding of and has agreed to the management plan. Patient is aware to call the clinic if symptoms persist or worsen. Patient is aware when to return to the clinic for a follow-up visit. Patient educated on when it is appropriate to go to the emergency department.   Time call ended: 1:10 PM  I provided 10 minutes of  non face-to-face time during this encounter.    Ivy Lynn, NP

## 2021-01-17 NOTE — Assessment & Plan Note (Signed)
Take meds as prescribed - Use a cool mist humidifier  -Use saline nose sprays frequently -Force fluids -For fever or aches or pains- take Tylenol or ibuprofen. -Patient tested positive for COVID-19 -Molnupurivir RX sent to pharmacy Follow up with worsening unresolved symptoms

## 2021-02-03 ENCOUNTER — Ambulatory Visit: Payer: Medicare Other | Admitting: Physician Assistant

## 2021-02-16 NOTE — Progress Notes (Signed)
Cardiology Office Note:    Date:  03/01/2021   ID:  Carol [name], DOB February 06, 1946, MRN [id number]  PCP:  Janora Norlander, DO   [facility] Providers Cardiologist:  Pixie Casino, MD {  Referring MD: Janora Norlander, DO   Chief Complaint  Patient presents with   Follow-up    Carotid artery stenosis    History of Present Illness:    Carol [name] is a 75 y.o. female with a hx of mild to moderate bilateral carotid artery stenosis and HLD now on repatha. She has a family history of premature coronary artery disease. She has followed with Dr. Debara Pickett and was last seen 11/2018. She was doing well at that time. Lipids responded well to repatha. I saw her for follow up 12/08/19 and was concerned about her triglycerides. Repeat labs showed improvement in triglycerides and she never started lovaza. Question lab error on 10/2019.   She presents today for annual follow up. She complains of bilateral hand tingling. We will check an A1c today, but she sleeps with her hands curled and I question carpal tunnel. She has had one episode of atypical chest pain in the past month. No dyspnea, lower extremity swelling, or orthopnea.    Past Medical History:  Diagnosis Date   Carotid artery stenosis    Cataract    Diverticulosis    Fractured hand    right   Hyperlipidemia    Osteopenia    Precancerous skin lesion 2016   Squamous cell carcinoma of skin     Past Surgical History:  Procedure Laterality Date   CATARACT EXTRACTION W/ INTRAOCULAR LENS IMPLANT Bilateral    CHOLECYSTECTOMY  2009   EYE SURGERY     hand fracture surgery      Current Medications: Current Meds  Medication Sig   aspirin EC 81 MG tablet Take 81 mg by mouth daily.   Calcium 500-125 MG-UNIT TABS 1 tablet. 1 tablet daily.   Cholecalciferol (VITAMIN D3) 2000 units capsule 1 capsule.   REPATHA SURECLICK 277 MG/ML SOAJ INJECT 1 DOSE SUBCUTANEOUSLY EVERY 14 DAYS   rosuvastatin (CRESTOR) 5 MG tablet TAKE 1/2  (ONE-HALF) TABLET BY MOUTH THREE TIMES A WEEK     Allergies:   Penicillins, Sulfa antibiotics, Sulfamethoxazole, Lipitor [atorvastatin], and Crestor [rosuvastatin calcium]   Social History   Socioeconomic History   Marital status: Widowed    Spouse name: Not on file   Number of children: 2   Years of education: 12   Highest education level: High school graduate  Occupational History   Occupation: retired  Tobacco Use   Smoking status: Never   Smokeless tobacco: Never  Vaping Use   Vaping Use: Never used  Substance and Sexual Activity   Alcohol use: No   Drug use: No   Sexual activity: Not Currently  Other Topics Concern   Not on file  Social History Narrative   Lives alone. Has a basement   One child in [place], one in [place] Strain: Low Risk    Difficulty of Paying Living Expenses: Not hard at all  Food Insecurity: No Food Insecurity   Worried About Charity fundraiser in the Last Year: Never true   Arboriculturist in the Last Year: Never true  Transportation Needs: No Transportation Needs   Lack of Transportation (Medical): No   Lack of Transportation (Non-Medical): No  Physical Activity: Sufficiently Active  Days of Exercise per Week: 5 days   Minutes of Exercise per Session: 30 min  Stress: No Stress Concern Present   Feeling of Stress : Not at all  Social Connections: Moderately Integrated   Frequency of Communication with Friends and Family: More than three times a week   Frequency of Social Gatherings with Friends and Family: More than three times a week   Attends Religious Services: More than 4 times per year   Active Member of [employer] or Organizations: Yes   Attends Archivist Meetings: More than 4 times per year   Marital Status: Widowed     Family History: The patient's family history includes Alzheimer's disease in her brother and sister; CAD in her brother; Cancer in her sister,  sister, and sister; Diabetes in her sister; Heart disease in her brother, brother, father, and mother; Hyperlipidemia in her sister; Hypertension in her brother; Kidney disease in her brother and sister.  ROS:   Please see the history of present illness.     All other systems reviewed and are negative.  EKGs/Labs/Other Studies Reviewed:    The following studies were reviewed today:  Carotid artery [place] 07/2020: Summary:  Right Carotid: Velocities in the right ICA are consistent with a 40-59%                 stenosis. Non-hemodynamically significant plaque <50% noted  in                 the CCA.   Left Carotid: Velocities in the left ICA are consistent with a 1-39%  stenosis.   Vertebrals:  Bilateral vertebral arteries demonstrate antegrade flow.  Subclavians: Normal flow hemodynamics were seen in bilateral subclavian               arteries.   EKG:  EKG is  ordered today.  The ekg ordered today demonstrates sinus rhythm with sinus arrhythmia HR 63  Recent Labs: 03/23/2020: TSH 2.790 09/22/2020: ALT 13; BUN 17; Creatinine, Ser 0.96; Potassium 5.0; Sodium 143  Recent Lipid Panel    Component Value Date/Time   CHOL 156 09/22/2020 0805   CHOL 151 10/22/2012 0833   TRIG 238 (H) 09/22/2020 0805   TRIG 260 (H) 12/21/2016 1618   TRIG 80 10/22/2012 0833   HDL 47 09/22/2020 0805   HDL 47 12/21/2016 1618   HDL 51 10/22/2012 0833   CHOLHDL 3.3 09/22/2020 0805   LDLCALC 70 09/22/2020 0805   LDLCALC 87 07/29/2013 0804   LDLCALC 84 10/22/2012 0833   LDLDIRECT 93 10/08/2014 0759     Risk Assessment/Calculations:           Physical Exam:    VS:  BP (!) 148/80    Pulse 63    Ht 5\' 6"  (1.676 m)    Wt 188 lb 12.8 oz (85.6 kg)    SpO2 97%    BMI 30.47 kg/m     Wt Readings from Last 3 Encounters:  03/01/21 188 lb 12.8 oz (85.6 kg)  12/07/20 175 lb (79.4 kg)  09/27/20 184 lb 3.2 oz (83.6 kg)     GEN:  Well nourished, well developed in no acute distress HEENT: Normal NECK: No JVD;  No carotid bruits LYMPHATICS: No lymphadenopathy CARDIAC: RRR, no murmurs, rubs, gallops RESPIRATORY:  Clear to auscultation without rales, wheezing or rhonchi  ABDOMEN: Soft, non-tender, non-distended MUSCULOSKELETAL:  No edema; No deformity  SKIN: Warm and dry NEUROLOGIC:  Alert and oriented x 3 PSYCHIATRIC:  Normal  affect   ASSESSMENT:    1. Bilateral carotid artery stenosis   2. Hyperlipidemia LDL goal <70   3. Primary hypertension   4. Paresthesia of both hands    PLAN:    In order of problems listed above:  Hyperlipidemia 11/06/2019: Cholesterol, Total 166; HDL 39; LDL Chol Calc (NIH) 37; Triglycerides 654 09/22/2020: Cholesterol, Total 156; HDL 47; LDL Chol Calc (NIH) 70; Triglycerides 238 - question lab error on 10/2019 - recheck fasting labs today   Carotic artery stenosis - [place] 07/2020 stable disease - repeat US 07/2021   Hypertension - has white coat hypertension - not on anti-hypertensives - recheck BP was 134/78   Bilateral hand tingling - will check A1c, last one was 5.8% - question carpal tunnel like symptoms, she does have a history of trigger finger   Follow up with me or Dr. Debara Pickett in 1 year.    Medication Adjustments/Labs and Tests Ordered: Current medicines are reviewed at length with the patient today.  Concerns regarding medicines are outlined above.  Orders Placed This Encounter  Procedures   Lipid panel   Hemoglobin A1c   EKG 12-Lead   VAS US CAROTID   No orders of the defined types were placed in this encounter.   Patient Instructions  Medication Instructions:  No Changes *If you need a refill on your cardiac medications before your next appointment, please call your pharmacy*   Lab Work: Lipid, HgA1C If you have labs (blood work) drawn today and your tests are completely normal, you will receive your results only by: [place] (if you have MyChart) OR A paper copy in the mail If you have any lab test that is abnormal or we  need to change your treatment, we will call you to review the results.   Testing/Procedures: [place], [address] Your physician has requested that you have a carotid duplex. This test is an ultrasound of the carotid arteries in your neck. It looks at blood flow through these arteries that supply the brain with blood. Allow one hour for this exam. There are no restrictions or special instructions.    Follow-Up: At [facility], you and your health needs are our priority.  As part of our continuing mission to provide you with exceptional heart care, we have created designated Provider Care Teams.  These Care Teams include your primary Cardiologist (physician) and Advanced Practice Providers (APPs -  Physician Assistants and Nurse Practitioners) who all work together to provide you with the care you need, when you need it.  We recommend signing up for the patient portal called "MyChart".  Sign up information is provided on this After Visit Summary.  MyChart is used to connect with patients for Virtual Visits (Telemedicine).  Patients are able to view lab/test results, encounter notes, upcoming appointments, etc.  Non-urgent messages can be sent to your provider as well.   To learn more about what you can do with MyChart, go to [email or web address].    Your next appointment:   1 year(s)  The format for your next appointment:   In Person  Provider:   Pixie Casino, MD         Signed, [place], [place]  03/01/2021 10:05 AM    [place]

## 2021-03-01 ENCOUNTER — Ambulatory Visit (INDEPENDENT_AMBULATORY_CARE_PROVIDER_SITE_OTHER): Payer: Medicare Other | Admitting: Physician Assistant

## 2021-03-01 ENCOUNTER — Other Ambulatory Visit: Payer: Self-pay

## 2021-03-01 ENCOUNTER — Encounter: Payer: Self-pay | Admitting: Physician Assistant

## 2021-03-01 VITALS — BP 148/80 | HR 63 | Ht 66.0 in | Wt 188.8 lb

## 2021-03-01 DIAGNOSIS — E785 Hyperlipidemia, unspecified: Secondary | ICD-10-CM | POA: Diagnosis not present

## 2021-03-01 DIAGNOSIS — I10 Essential (primary) hypertension: Secondary | ICD-10-CM

## 2021-03-01 DIAGNOSIS — R202 Paresthesia of skin: Secondary | ICD-10-CM | POA: Diagnosis not present

## 2021-03-01 DIAGNOSIS — I6523 Occlusion and stenosis of bilateral carotid arteries: Secondary | ICD-10-CM

## 2021-03-01 NOTE — Patient Instructions (Signed)
Medication Instructions:  No Changes *If you need a refill on your cardiac medications before your next appointment, please call your pharmacy*   Lab Work: Lipid, HgA1C If you have labs (blood work) drawn today and your tests are completely normal, you will receive your results only by: [place] (if you have MyChart) OR A paper copy in the mail If you have any lab test that is abnormal or we need to change your treatment, we will call you to review the results.   Testing/Procedures: [place], [address] Your physician has requested that you have a carotid duplex. This test is an ultrasound of the carotid arteries in your neck. It looks at blood flow through these arteries that supply the brain with blood. Allow one hour for this exam. There are no restrictions or special instructions.    Follow-Up: At [facility], you and your health needs are our priority.  As part of our continuing mission to provide you with exceptional heart care, we have created designated Provider Care Teams.  These Care Teams include your primary Cardiologist (physician) and Advanced Practice Providers (APPs -  Physician Assistants and Nurse Practitioners) who all work together to provide you with the care you need, when you need it.  We recommend signing up for the patient portal called "MyChart".  Sign up information is provided on this After Visit Summary.  MyChart is used to connect with patients for Virtual Visits (Telemedicine).  Patients are able to view lab/test results, encounter notes, upcoming appointments, etc.  Non-urgent messages can be sent to your provider as well.   To learn more about what you can do with MyChart, go to [email or web address].    Your next appointment:   1 year(s)  The format for your next appointment:   In Person  Provider:   Pixie Casino, MD

## 2021-03-02 LAB — LIPID PANEL
Chol/HDL Ratio: 2.5 ratio (ref 0.0–4.4)
Cholesterol, Total: 140 mg/dL (ref 100–199)
HDL: 57 mg/dL (ref >39)
LDL Chol Calc (NIH): 54 mg/dL (ref 0–99)
Triglycerides: 179 mg/dL — ABNORMAL HIGH (ref 0–149)
VLDL Cholesterol Cal: 29 mg/dL (ref 5–40)

## 2021-03-02 LAB — HEMOGLOBIN A1C
Est. average glucose Bld gHb Est-mCnc: 123 mg/dL
Hgb A1c MFr Bld: 5.9 % — ABNORMAL HIGH (ref 4.8–5.6)

## 2021-03-03 ENCOUNTER — Other Ambulatory Visit: Payer: Self-pay

## 2021-03-03 ENCOUNTER — Ambulatory Visit (INDEPENDENT_AMBULATORY_CARE_PROVIDER_SITE_OTHER): Payer: Medicare Other | Admitting: Physician Assistant

## 2021-03-03 DIAGNOSIS — L570 Actinic keratosis: Secondary | ICD-10-CM

## 2021-03-07 ENCOUNTER — Encounter: Payer: Self-pay | Admitting: Physician Assistant

## 2021-03-07 NOTE — Progress Notes (Signed)
° °  Follow-Up Visit   Subjective  Carol [name] is a 76 y.o. female who presents for the following: Follow-up (On skin cancers on forehead and nose. Patient says they are doing well no concerns. ). Crust on her nose has increased in size of the last 3 months.    The following portions of the chart were reviewed this encounter and updated as appropriate:  Tobacco   Allergies   Meds   Problems   Med Hx   Surg Hx   Fam Hx       Objective  Well appearing patient in no apparent distress; mood and affect are within normal limits.  A focused examination was performed including face. Relevant physical exam findings are noted in the Assessment and Plan.  Left Nasal Sidewall (2) Erythematous patches with gritty scale.   Assessment & Plan  AK (actinic keratosis) (2) Left Nasal Sidewall  Destruction of lesion - Left Nasal Sidewall Complexity: simple   Destruction method: cryotherapy   Informed consent: discussed and consent obtained   Timeout:  patient name, date of birth, surgical site, and procedure verified Lesion destroyed using liquid nitrogen: Yes   Cryotherapy cycles:  1 Outcome: patient tolerated procedure well with no complications   Post-procedure details: wound care instructions given      I, Eldridge Marcott, PA-C, have reviewed all documentation's for this visit.  The documentation on 03/07/21 for the exam, diagnosis, procedures and orders are all accurate and complete.

## 2021-03-08 ENCOUNTER — Other Ambulatory Visit (HOSPITAL_COMMUNITY): Payer: Self-pay | Admitting: Family Medicine

## 2021-03-08 DIAGNOSIS — Z1231 Encounter for screening mammogram for malignant neoplasm of breast: Secondary | ICD-10-CM

## 2021-03-14 ENCOUNTER — Other Ambulatory Visit: Payer: Self-pay

## 2021-03-14 ENCOUNTER — Ambulatory Visit (HOSPITAL_COMMUNITY)
Admission: RE | Admit: 2021-03-14 | Discharge: 2021-03-14 | Disposition: A | Discharge status: Home / Self Care | Payer: Medicare Other | Source: Ambulatory Visit | Attending: Family Medicine | Admitting: Family Medicine

## 2021-03-14 DIAGNOSIS — Z1231 Encounter for screening mammogram for malignant neoplasm of breast: Secondary | ICD-10-CM | POA: Diagnosis not present

## 2021-04-25 ENCOUNTER — Encounter: Payer: Self-pay | Admitting: Nurse Practitioner

## 2021-04-25 ENCOUNTER — Ambulatory Visit (INDEPENDENT_AMBULATORY_CARE_PROVIDER_SITE_OTHER): Payer: Medicare Other | Admitting: Nurse Practitioner

## 2021-04-25 VITALS — BP 171/76 | HR 58 | Temp 98.5°F | Ht 66.0 in | Wt 190.0 lb

## 2021-04-25 DIAGNOSIS — R300 Dysuria: Secondary | ICD-10-CM | POA: Diagnosis not present

## 2021-04-25 DIAGNOSIS — S338XXA Sprain of other parts of lumbar spine and pelvis, initial encounter: Secondary | ICD-10-CM | POA: Diagnosis not present

## 2021-04-25 DIAGNOSIS — M9903 Segmental and somatic dysfunction of lumbar region: Secondary | ICD-10-CM | POA: Diagnosis not present

## 2021-04-25 LAB — URINALYSIS, ROUTINE W REFLEX MICROSCOPIC
Bilirubin, UA: NEGATIVE
Glucose, UA: NEGATIVE
Ketones, UA: NEGATIVE
Nitrite, UA: NEGATIVE
Protein,UA: NEGATIVE
Specific Gravity, UA: <1.005 — ABNORMAL LOW (ref 1.005–1.030)
Urobilinogen, Ur: 0.2 mg/dL (ref 0.2–1.0)
pH, UA: 6 (ref 5.0–7.5)

## 2021-04-25 LAB — MICROSCOPIC EXAMINATION: WBC, UA: >30 /hpf — AB (ref 0–5)

## 2021-04-25 MED ORDER — NITROFURANTOIN MONOHYD MACRO 100 MG PO CAPS: 100.0000 mg | ORAL_CAPSULE | Freq: Two times a day (BID) | ORAL | 0 refills | Status: DC

## 2021-04-25 MED ORDER — PHENAZOPYRIDINE HCL 95 MG PO TABS: 95.0000 mg | ORAL_TABLET | Freq: Three times a day (TID) | ORAL | 0 refills | Status: DC | PRN

## 2021-04-25 NOTE — Patient Instructions (Signed)
Dysuria ?Dysuria is pain or discomfort during urination. The pain or discomfort may be felt in the part of the body that drains urine from the bladder (urethra) or in the surrounding tissue of the genitals. The pain may also be felt in the groin area, lower abdomen, or lower back. ?You may have to urinate frequently or have the sudden feeling that you have to urinate (urgency). Dysuria can affect anyone, but it is more common in females. Dysuria can be caused by many different things, including: ?Urinary tract infection. ?Kidney stones or bladder stones. ?Certain STIs (sexually transmitted infections), such as chlamydia. ?Dehydration. ?Inflammation of the tissues of the vagina. ?Use of certain medicines. ?Use of certain soaps or scented products that cause irritation. ?Follow these instructions at home: ?Medicines ?Take over-the-counter and prescription medicines only as told by your health care provider. ?If you were prescribed an antibiotic medicine, take it as told by your health care provider. Do not stop taking the antibiotic even if you start to feel better. ?Eating and drinking ? ?Drink enough fluid to keep your urine pale yellow. ?Avoid caffeinated beverages, tea, and alcohol. These beverages can irritate the bladder and make dysuria worse. In males, alcohol may irritate the prostate. ?General instructions ?Watch your condition for any changes. ?Urinate often. Avoid holding urine for long periods of time. ?If you are female, you should wipe from front to back after urinating or having a bowel movement. Use each piece of toilet paper only once. ?Empty your bladder after sex. ?Keep all follow-up visits. This is important. ?If you had any tests done to find the cause of dysuria, it is up to you to get your test results. Ask your health care provider, or the department that is doing the test, when your results will be ready. ?Contact a health care provider if: ?You have a fever. ?You develop pain in your back or  sides. ?You have nausea or vomiting. ?You have blood in your urine. ?You are not urinating as often as you usually do. ?Get help right away if: ?Your pain is severe and not relieved with medicines. ?You cannot eat or drink without vomiting. ?You are confused. ?You have a rapid heartbeat while resting. ?You have shaking or chills. ?You feel extremely weak. ?Summary ?Dysuria is pain or discomfort while urinating. Many different conditions can lead to dysuria. ?If you have dysuria, you may have to urinate frequently or have the sudden feeling that you have to urinate (urgency). ?Watch your condition for any changes. Keep all follow-up visits. ?Make sure that you urinate often and drink enough fluid to keep your urine pale yellow. ?This information is not intended to replace advice given to you by your health care provider. Make sure you discuss any questions you have with your health care provider. ?Document Revised: 09/26/2019 Document Reviewed: 09/26/2019 ?Elsevier Patient Education ? 2022 Elsevier Inc. ? ?

## 2021-04-25 NOTE — Progress Notes (Signed)
Acute Office Visit  Subjective:    Patient ID: Carol [name], female    DOB: December 25, 1945, 76 y.o.   MRN: [id number]  Chief Complaint  Patient presents with   Dysuria        Dysuria  This is a new problem. Episode onset: in the past 3 days. The problem occurs every urination. The problem has been unchanged. The quality of the pain is described as aching. The pain is at a severity of 5/10. The pain is moderate. There has been no fever. Associated symptoms include urgency. Pertinent negatives include no chills, discharge, flank pain, hesitancy or nausea. She has tried nothing for the symptoms.    Past Medical History:  Diagnosis Date   Carotid artery stenosis    Cataract    Diverticulosis    Fractured hand    right   Hyperlipidemia    Osteopenia    Precancerous skin lesion 2016   Squamous cell carcinoma of skin     Past Surgical History:  Procedure Laterality Date   CATARACT EXTRACTION W/ INTRAOCULAR LENS IMPLANT Bilateral    CHOLECYSTECTOMY  2009   EYE SURGERY     hand fracture surgery      Family History  Problem Relation Age of Onset   Heart disease Mother    Heart disease Father    Cancer Sister        breast   Hyperlipidemia Sister    Diabetes Sister    Cancer Sister        bone   Alzheimer's disease Sister    Heart disease Brother    CAD Brother    Cancer Sister        breast   Kidney disease Sister    Heart disease Brother    Hypertension Brother    Alzheimer's disease Brother    Kidney disease Brother        2018 starting dialysis    Social History   Socioeconomic History   Marital status: Widowed    Spouse name: Not on file   Number of children: 2   Years of education: 12   Highest education level: High school graduate  Occupational History   Occupation: retired  Tobacco Use   Smoking status: Never   Smokeless tobacco: Never  Vaping Use   Vaping Use: Never used  Substance and Sexual Activity   Alcohol use: No   Drug use: No    Sexual activity: Not Currently  Other Topics Concern   Not on file  Social History Narrative   Lives alone. Has a basement   One child in [place], one in [place] Strain: Low Risk    Difficulty of Paying Living Expenses: Not hard at all  Food Insecurity: No Food Insecurity   Worried About Charity fundraiser in the Last Year: Never true   Arboriculturist in the Last Year: Never true  Transportation Needs: No Transportation Needs   Lack of Transportation (Medical): No   Lack of Transportation (Non-Medical): No  Physical Activity: Sufficiently Active   Days of Exercise per Week: 5 days   Minutes of Exercise per Session: 30 min  Stress: No Stress Concern Present   Feeling of Stress : Not at all  Social Connections: Moderately Integrated   Frequency of Communication with Friends and Family: More than three times a week   Frequency of Social Gatherings with Friends and Family: More than three  times a week   Attends Religious Services: More than 4 times per year   Active Member of Clubs or Organizations: Yes   Attends Archivist Meetings: More than 4 times per year   Marital Status: Widowed  Human resources officer Violence: Not At Risk   Fear of Current or Ex-Partner: No   Emotionally Abused: No   Physically Abused: No   Sexually Abused: No    Outpatient Medications Prior to Visit  Medication Sig Dispense Refill   aspirin EC 81 MG tablet Take 81 mg by mouth daily.     Calcium 500-125 MG-UNIT TABS 1 tablet. 1 tablet daily.     Cholecalciferol (VITAMIN D3) 2000 units capsule 1 capsule.     REPATHA SURECLICK 751 MG/ML SOAJ INJECT 1 DOSE SUBCUTANEOUSLY EVERY 14 DAYS 2 mL 11   rosuvastatin (CRESTOR) 5 MG tablet TAKE 1/2 (ONE-HALF) TABLET BY MOUTH THREE TIMES A WEEK 18 tablet 3   No facility-administered medications prior to visit.    Allergies  Allergen Reactions   Penicillins Hives   Sulfa Antibiotics     Sulfamethoxazole Itching   Lipitor [Atorvastatin] Diarrhea   Crestor [Rosuvastatin Calcium] Other (See Comments)    Myalgias     Review of Systems  Constitutional: Negative.  Negative for chills.  HENT: Negative.    Eyes: Negative.   Respiratory: Negative.    Cardiovascular: Negative.   Gastrointestinal: Negative.  Negative for abdominal pain, diarrhea and nausea.  Genitourinary:  Positive for dysuria and urgency. Negative for flank pain and hesitancy.  All other systems reviewed and are negative.     Objective:    Physical Exam Vitals and nursing note reviewed.  Constitutional:      Appearance: Normal appearance.  HENT:     Head: Normocephalic.     Right Ear: External ear normal.     Left Ear: External ear normal.     Nose: Nose normal.  Eyes:     Conjunctiva/sclera: Conjunctivae normal.  Cardiovascular:     Rate and Rhythm: Normal rate and regular rhythm.     Pulses: Normal pulses.     Heart sounds: Murmur heard.  Pulmonary:     Effort: Pulmonary effort is normal.     Breath sounds: Normal breath sounds.  Abdominal:     General: Bowel sounds are normal.     Tenderness: There is no abdominal tenderness. There is no right CVA tenderness or left CVA tenderness.  Musculoskeletal:        General: Normal range of motion.  Skin:    General: Skin is warm.     Findings: No rash.  Neurological:     General: No focal deficit present.     Mental Status: She is alert and oriented to person, place, and time.  Psychiatric:        Mood and Affect: Mood normal.        Behavior: Behavior normal.    BP (!) 171/76    Pulse (!) 58    Temp 98.5 F (36.9 C)    Ht $R'5\' 6"'bd$  (1.676 m)    Wt 190 lb (86.2 kg)    SpO2 97%    BMI 30.67 kg/m  Wt Readings from Last 3 Encounters:  04/25/21 190 lb (86.2 kg)  03/01/21 188 lb 12.8 oz (85.6 kg)  12/07/20 175 lb (79.4 kg)    Health Maintenance Due  Topic Date Due   Zoster Vaccines- Shingrix (1 of 2) Never done   Pneumonia Vaccine 65+ Years  old (1 - PCV) Never done   COVID-19 Vaccine (3 - Moderna risk series) 06/06/2019    There are no preventive care reminders to display for this patient.   Lab Results  Component Value Date   TSH 2.790 03/23/2020   Lab Results  Component Value Date   WBC 7.0 11/06/2019   HGB 14.3 11/06/2019   HCT 43.1 11/06/2019   MCV 93 11/06/2019   PLT 191 11/06/2019   Lab Results  Component Value Date   NA 143 09/22/2020   K 5.0 09/22/2020   CO2 24 09/22/2020   GLUCOSE 106 (H) 09/22/2020   BUN 17 09/22/2020   CREATININE 0.96 09/22/2020   BILITOT 0.6 09/22/2020   ALKPHOS 73 09/22/2020   AST 16 09/22/2020   ALT 13 09/22/2020   PROT 7.1 09/22/2020   ALBUMIN 4.5 09/22/2020   CALCIUM 9.1 09/22/2020   EGFR 61 09/22/2020   Lab Results  Component Value Date   CHOL 140 03/01/2021   Lab Results  Component Value Date   HDL 57 03/01/2021   Lab Results  Component Value Date   LDLCALC 54 03/01/2021   Lab Results  Component Value Date   TRIG 179 (H) 03/01/2021   Lab Results  Component Value Date   CHOLHDL 2.5 03/01/2021   Lab Results  Component Value Date   HGBA1C 5.9 (H) 03/01/2021       Assessment & Plan:    Appears well, in no apparent distress.  Vital signs are normal. The abdomen is soft without tenderness, guarding, mass, rebound or organomegaly. No CVA tenderness or inguinal adenopathy noted. Urine dipstick shows positive for RBC's and positive for leukocytes.  Micro exam: many+ bacteria.   UTI uncomplicated without evidence of pyelonephritis  PLAN: Treatment per orders - also push fluids, may use Pyridium OTC prn. Call or return to clinic prn if these symptoms worsen or fail to improve as anticipated.   Problem List Items Addressed This Visit       Other   Dysuria - Primary   Relevant Medications   phenazopyridine (PYRIDIUM) 95 MG tablet   nitrofurantoin, macrocrystal-monohydrate, (MACROBID) 100 MG capsule   Other Relevant Orders   Urinalysis, Routine w reflex  microscopic   Urine Culture     Meds ordered this encounter  Medications   phenazopyridine (PYRIDIUM) 95 MG tablet    Sig: Take 1 tablet (95 mg total) by mouth 3 (three) times daily as needed for pain.    Dispense:  10 tablet    Refill:  0    Order Specific Question:   Supervising Provider    Answer:   Claretta Fraise [[id number]]   nitrofurantoin, macrocrystal-monohydrate, (MACROBID) 100 MG capsule    Sig: Take 1 capsule (100 mg total) by mouth 2 (two) times daily. 1 po BId    Dispense:  14 capsule    Refill:  0    Order Specific Question:   Supervising Provider    Answer:   Claretta Fraise [[id number]]     Ivy Lynn, NP

## 2021-04-27 DIAGNOSIS — M9903 Segmental and somatic dysfunction of lumbar region: Secondary | ICD-10-CM | POA: Diagnosis not present

## 2021-04-27 DIAGNOSIS — S338XXA Sprain of other parts of lumbar spine and pelvis, initial encounter: Secondary | ICD-10-CM | POA: Diagnosis not present

## 2021-04-29 LAB — URINE CULTURE

## 2021-05-31 ENCOUNTER — Encounter: Payer: Self-pay | Admitting: Nurse Practitioner

## 2021-05-31 ENCOUNTER — Ambulatory Visit (INDEPENDENT_AMBULATORY_CARE_PROVIDER_SITE_OTHER): Payer: Medicare Other | Admitting: Nurse Practitioner

## 2021-05-31 VITALS — BP 154/82 | HR 65 | Temp 98.7°F | Ht 66.0 in | Wt 191.0 lb

## 2021-05-31 DIAGNOSIS — J0110 Acute frontal sinusitis, unspecified: Secondary | ICD-10-CM

## 2021-05-31 MED ORDER — FLUTICASONE PROPIONATE 50 MCG/ACT NA SUSP: 2.0000 | Freq: Every day | NASAL | 6 refills | Status: DC

## 2021-05-31 MED ORDER — DOXYCYCLINE HYCLATE 100 MG PO TABS: 100.0000 mg | ORAL_TABLET | Freq: Two times a day (BID) | ORAL | 0 refills | Status: DC

## 2021-05-31 NOTE — Progress Notes (Signed)
? ?Acute Office Visit ? ?Subjective:  ? ? Patient ID: Carol [name], female    DOB: [birth year], 76 y.o.   MRN: [id number] ? ?Chief Complaint  ?Patient presents with  ? Sinus Problem  ? ? ?Sinusitis ?This is a new problem. The current episode started yesterday. The problem has been gradually worsening since onset. There has been no fever. The pain is moderate. Associated symptoms include congestion, coughing, headaches and sinus pressure. Pertinent negatives include no sore throat. Past treatments include nothing.  ? ? ?Past Medical History:  ?Diagnosis Date  ? Carotid artery stenosis   ? Cataract   ? Diverticulosis   ? Fractured hand   ? right  ? Hyperlipidemia   ? Osteopenia   ? Precancerous skin lesion 2016  ? Squamous cell carcinoma of skin   ? ? ?Past Surgical History:  ?Procedure Laterality Date  ? CATARACT EXTRACTION W/ INTRAOCULAR LENS IMPLANT Bilateral   ? CHOLECYSTECTOMY  2009  ? EYE SURGERY    ? hand fracture surgery    ? ? ?Family History  ?Problem Relation Age of Onset  ? Heart disease Mother   ? Heart disease Father   ? Cancer Sister   ?     breast  ? Hyperlipidemia Sister   ? Diabetes Sister   ? Cancer Sister   ?     bone  ? Alzheimer's disease Sister   ? Heart disease Brother   ? CAD Brother   ? Cancer Sister   ?     breast  ? Kidney disease Sister   ? Heart disease Brother   ? Hypertension Brother   ? Alzheimer's disease Brother   ? Kidney disease Brother   ?     2018 starting dialysis  ? ? ?Social History  ? ?Socioeconomic History  ? Marital status: Widowed  ?  Spouse name: Not on file  ? Number of children: 2  ? Years of education: 46  ? Highest education level: High school graduate  ?Occupational History  ? Occupation: retired  ?Tobacco Use  ? Smoking status: Never  ? Smokeless tobacco: Never  ?Vaping Use  ? Vaping Use: Never used  ?Substance and Sexual Activity  ? Alcohol use: No  ? Drug use: No  ? Sexual activity: Not Currently  ?Other Topics Concern  ? Not on file  ?Social History Narrative  ?  Lives alone. Has a basement  ? One child in [place], one in [place]  ? ?Social Determinants of Health  ? ?Financial Resource Strain: Low Risk   ? Difficulty of Paying Living Expenses: Not hard at all  ?Food Insecurity: No Food Insecurity  ? Worried About Charity fundraiser in the Last Year: Never true  ? Ran Out of Food in the Last Year: Never true  ?Transportation Needs: No Transportation Needs  ? Lack of Transportation (Medical): No  ? Lack of Transportation (Non-Medical): No  ?Physical Activity: Sufficiently Active  ? Days of Exercise per Week: 5 days  ? Minutes of Exercise per Session: 30 min  ?Stress: No Stress Concern Present  ? Feeling of Stress : Not at all  ?Social Connections: Moderately Integrated  ? Frequency of Communication with Friends and Family: More than three times a week  ? Frequency of Social Gatherings with Friends and Family: More than three times a week  ? Attends Religious Services: More than 4 times per year  ? Active Member of Clubs or Organizations: Yes  ? Attends Club or  Organization Meetings: More than 4 times per year  ? Marital Status: Widowed  ?Intimate Partner Violence: Not At Risk  ? Fear of Current or Ex-Partner: No  ? Emotionally Abused: No  ? Physically Abused: No  ? Sexually Abused: No  ? ? ?Outpatient Medications Prior to Visit  ?Medication Sig Dispense Refill  ? aspirin EC 81 MG tablet Take 81 mg by mouth daily.    ? Calcium 500-125 MG-UNIT TABS 1 tablet. 1 tablet daily.    ? Cholecalciferol (VITAMIN D3) 2000 units capsule 1 capsule.    ? nitrofurantoin, macrocrystal-monohydrate, (MACROBID) 100 MG capsule Take 1 capsule (100 mg total) by mouth 2 (two) times daily. 1 po BId 14 capsule 0  ? phenazopyridine (PYRIDIUM) 95 MG tablet Take 1 tablet (95 mg total) by mouth 3 (three) times daily as needed for pain. 10 tablet 0  ? REPATHA SURECLICK 992 MG/ML SOAJ INJECT 1 DOSE SUBCUTANEOUSLY EVERY 14 DAYS 2 mL 11  ? rosuvastatin (CRESTOR) 5 MG tablet TAKE 1/2 (ONE-HALF) TABLET BY  MOUTH THREE TIMES A WEEK 18 tablet 3  ? ?No facility-administered medications prior to visit.  ? ? ?Allergies  ?Allergen Reactions  ? Penicillins Hives  ? Sulfa Antibiotics   ? Sulfamethoxazole Itching  ? Lipitor [Atorvastatin] Diarrhea  ? Crestor [Rosuvastatin Calcium] Other (See Comments)  ?  Myalgias ?  ? ? ?Review of Systems  ?Constitutional: Negative.   ?HENT:  Positive for congestion and sinus pressure. Negative for sore throat.   ?Respiratory:  Positive for cough.   ?Cardiovascular: Negative.   ?Gastrointestinal: Negative.   ?Neurological:  Positive for headaches.  ?All other systems reviewed and are negative. ? ?   ?Objective:  ?  ?Physical Exam ?Vitals and nursing note reviewed.  ?Constitutional:   ?   Appearance: Normal appearance.  ?HENT:  ?   Head: Normocephalic.  ?   Right Ear: External ear normal.  ?   Left Ear: External ear normal.  ?   Nose: Congestion present.  ?   Mouth/Throat:  ?   Mouth: Mucous membranes are moist.  ?   Pharynx: Oropharynx is clear.  ?Eyes:  ?   Conjunctiva/sclera: Conjunctivae normal.  ?Cardiovascular:  ?   Rate and Rhythm: Normal rate and regular rhythm.  ?   Pulses: Normal pulses.  ?   Heart sounds: Normal heart sounds.  ?Pulmonary:  ?   Effort: Pulmonary effort is normal.  ?   Breath sounds: Normal breath sounds.  ?Abdominal:  ?   General: Bowel sounds are normal.  ?Musculoskeletal:     ?   General: Normal range of motion.  ?Skin: ?   General: Skin is warm.  ?Neurological:  ?   Mental Status: She is alert and oriented to person, place, and time.  ? ? ?BP (!) 154/82   Pulse 65   Temp 98.7 ?F (37.1 ?C)   Ht $R'5\' 6"'nV$  (1.676 m)   Wt 191 lb (86.6 kg)   SpO2 96%   BMI 30.83 kg/m?  ?Wt Readings from Last 3 Encounters:  ?05/31/21 191 lb (86.6 kg)  ?04/25/21 190 lb (86.2 kg)  ?03/01/21 188 lb 12.8 oz (85.6 kg)  ? ? ?Health Maintenance Due  ?Topic Date Due  ? Zoster Vaccines- Shingrix (1 of 2) Never done  ? Pneumonia Vaccine 68+ Years old (1 - PCV) Never done  ? COVID-19 Vaccine (3  - Moderna risk series) 06/06/2019  ? DEXA SCAN  05/14/2021  ? ? ?There are no preventive care  reminders to display for this patient. ? ? ?Lab Results  ?Component Value Date  ? TSH 2.790 03/23/2020  ? ?Lab Results  ?Component Value Date  ? WBC 7.0 11/06/2019  ? HGB 14.3 11/06/2019  ? HCT 43.1 11/06/2019  ? MCV 93 11/06/2019  ? PLT 191 11/06/2019  ? ?Lab Results  ?Component Value Date  ? NA 143 09/22/2020  ? K 5.0 09/22/2020  ? CO2 24 09/22/2020  ? GLUCOSE 106 (H) 09/22/2020  ? BUN 17 09/22/2020  ? CREATININE 0.96 09/22/2020  ? BILITOT 0.6 09/22/2020  ? ALKPHOS 73 09/22/2020  ? AST 16 09/22/2020  ? ALT 13 09/22/2020  ? PROT 7.1 09/22/2020  ? ALBUMIN 4.5 09/22/2020  ? CALCIUM 9.1 09/22/2020  ? EGFR 61 09/22/2020  ? ?Lab Results  ?Component Value Date  ? CHOL 140 03/01/2021  ? ?Lab Results  ?Component Value Date  ? HDL 57 03/01/2021  ? ?Lab Results  ?Component Value Date  ? [place] 54 03/01/2021  ? ?Lab Results  ?Component Value Date  ? TRIG 179 (H) 03/01/2021  ? ?Lab Results  ?Component Value Date  ? CHOLHDL 2.5 03/01/2021  ? ?Lab Results  ?Component Value Date  ? HGBA1C 5.9 (H) 03/01/2021  ? ? ?   ?Assessment & Plan:  ?Take meds as prescribed ?- Use a cool mist humidifier  ?-Use saline nose sprays frequently ?-Force fluids ?-For fever or aches or pains- take Tylenol or ibuprofen. ?-Doxycycline 100 mg tablet by mouth twice daily ?-If symptoms do not improve, she may need to be COVID tested to rule this out ?Follow up with worsening unresolved symptoms  ?Problem List Items Addressed This Visit   ?None ?Visit Diagnoses   ? ? Subacute frontal sinusitis    -  Primary  ? Relevant Medications  ? doxycycline (VIBRA-TABS) 100 MG tablet  ? fluticasone (FLONASE) 50 MCG/ACT nasal spray  ? ?  ? ? ? ?Meds ordered this encounter  ?Medications  ? doxycycline (VIBRA-TABS) 100 MG tablet  ?  Sig: Take 1 tablet (100 mg total) by mouth 2 (two) times daily.  ?  Dispense:  14 tablet  ?  Refill:  0  ?  Order Specific Question:   Supervising  Provider  ?  AnswerClaretta Fraise [[id number]]  ? fluticasone (FLONASE) 50 MCG/ACT nasal spray  ?  Sig: Place 2 sprays into both nostrils daily.  ?  Dispense:  16 g  ?  Refill:  6  ?  Order Specific Question

## 2021-05-31 NOTE — Patient Instructions (Signed)

## 2021-06-08 ENCOUNTER — Ambulatory Visit (INDEPENDENT_AMBULATORY_CARE_PROVIDER_SITE_OTHER): Payer: Medicare Other | Admitting: Physician Assistant

## 2021-06-08 ENCOUNTER — Encounter: Payer: Self-pay | Admitting: Physician Assistant

## 2021-06-08 DIAGNOSIS — C44519 Basal cell carcinoma of skin of other part of trunk: Secondary | ICD-10-CM | POA: Diagnosis not present

## 2021-06-08 DIAGNOSIS — Z1283 Encounter for screening for malignant neoplasm of skin: Secondary | ICD-10-CM | POA: Diagnosis not present

## 2021-06-08 DIAGNOSIS — Z85828 Personal history of other malignant neoplasm of skin: Secondary | ICD-10-CM

## 2021-06-08 DIAGNOSIS — D485 Neoplasm of uncertain behavior of skin: Secondary | ICD-10-CM

## 2021-06-08 NOTE — Patient Instructions (Signed)

## 2021-06-08 NOTE — Progress Notes (Addendum)
? ?Follow-Up Visit ?  ?Subjective  ?Carol [name] is a 76 y.o. female who presents for the following: Follow-up (Ln2 on face last office visit- no new concerns. Personal history of non mole skin cancer but no melanoma. ). Recheck face and full body.  ? ? ?The following portions of the chart were reviewed this encounter and updated as appropriate:  Tobacco  Allergies  Meds  Problems  Med Hx  Surg Hx  Fam Hx   ?  ? ?Objective  ?Well appearing patient in no apparent distress; mood and affect are within normal limits. ? ?A full examination was performed including scalp, head, eyes, ears, nose, lips, neck, chest, axillae, abdomen, back, buttocks, bilateral upper extremities, bilateral lower extremities, hands, feet, fingers, toes, fingernails, and toenails. All findings within normal limits unless otherwise noted below. ? ?No atypical nevi. All scars clear.  ? ?Mid Back, Superior ?Scaly erythematous macule  ? ? ? ? ? ? ?Mid back, Inferior ?Scaly erythematous macule  ? ? ? ? ? ? ?Mid Back ?Scaly erythematous macule  ? ? ? ? ? ? ? ?Assessment & Plan  ?Encounter for screening for malignant neoplasm of skin ? ?Yearly skin check ? ?BCC (basal cell carcinoma), back (3) ?Mid Back, Superior ? ?Skin / nail biopsy ?Type of biopsy: tangential   ?Informed consent: discussed and consent obtained   ?Timeout: patient name, date of birth, surgical site, and procedure verified   ?Procedure prep:  Patient was prepped and draped in usual sterile fashion (Non sterile) ?Prep type:  Chlorhexidine ?Anesthesia: the lesion was anesthetized in a standard fashion   ?Anesthetic:  1% lidocaine w/ epinephrine 1-100,000 local infiltration ?Instrument used: flexible razor blade   ?Outcome: patient tolerated procedure well   ?Post-procedure details: wound care instructions given   ? ?Destruction of lesion ?Complexity: simple   ?Destruction method: electrodesiccation and curettage   ?Informed consent: discussed and consent obtained   ?Timeout:   patient name, date of birth, surgical site, and procedure verified ?Anesthesia: the lesion was anesthetized in a standard fashion   ?Anesthetic:  1% lidocaine w/ epinephrine 1-100,000 local infiltration ?Curettage performed in three different directions: Yes   ?Electrodesiccation performed over the curetted area: Yes   ?Curettage cycles:  3 ?Margin per side (cm):  0.1 ?Final wound size (cm):  2.5 ?Hemostasis achieved with:  aluminum chloride ?Outcome: patient tolerated procedure well with no complications   ?Post-procedure details: wound care instructions given   ? ?Specimen 1 - Surgical pathology ?Differential Diagnosis: bcc vs scc- txpbx ? ?Check Margins: No ? ?Mid back, Inferior ? ?Skin / nail biopsy ?Type of biopsy: tangential   ?Informed consent: discussed and consent obtained   ?Timeout: patient name, date of birth, surgical site, and procedure verified   ?Procedure prep:  Patient was prepped and draped in usual sterile fashion (Non sterile) ?Prep type:  Chlorhexidine ?Anesthesia: the lesion was anesthetized in a standard fashion   ?Anesthetic:  1% lidocaine w/ epinephrine 1-100,000 local infiltration ?Instrument used: flexible razor blade   ?Outcome: patient tolerated procedure well   ?Post-procedure details: wound care instructions given   ? ?Destruction of lesion ?Complexity: simple   ?Destruction method: electrodesiccation and curettage   ?Informed consent: discussed and consent obtained   ?Timeout:  patient name, date of birth, surgical site, and procedure verified ?Anesthesia: the lesion was anesthetized in a standard fashion   ?Anesthetic:  1% lidocaine w/ epinephrine 1-100,000 local infiltration ?Curettage performed in three different directions: Yes   ?Electrodesiccation performed  over the curetted area: Yes   ?Curettage cycles:  3 ?Margin per side (cm):  0.1 ?Final wound size (cm):  2.5 ?Hemostasis achieved with:  aluminum chloride ?Outcome: patient tolerated procedure well with no complications    ?Post-procedure details: wound care instructions given   ? ?Specimen 2 - Surgical pathology ?Differential Diagnosis: bcc vs scc- txpbx ? ?Check Margins: No ? ?Mid Back ? ?Skin / nail biopsy ?Type of biopsy: tangential   ?Informed consent: discussed and consent obtained   ?Timeout: patient name, date of birth, surgical site, and procedure verified   ?Procedure prep:  Patient was prepped and draped in usual sterile fashion (Non sterile) ?Prep type:  Chlorhexidine ?Anesthesia: the lesion was anesthetized in a standard fashion   ?Anesthetic:  1% lidocaine w/ epinephrine 1-100,000 local infiltration ?Instrument used: flexible razor blade   ?Outcome: patient tolerated procedure well   ?Post-procedure details: wound care instructions given   ? ?Destruction of lesion ?Complexity: simple   ?Destruction method: electrodesiccation and curettage   ?Informed consent: discussed and consent obtained   ?Timeout:  patient name, date of birth, surgical site, and procedure verified ?Anesthesia: the lesion was anesthetized in a standard fashion   ?Anesthetic:  1% lidocaine w/ epinephrine 1-100,000 local infiltration ?Curettage performed in three different directions: Yes   ?Electrodesiccation performed over the curetted area: Yes   ?Curettage cycles:  3 ?Margin per side (cm):  0.1 ?Final wound size (cm):  1.5 ?Hemostasis achieved with:  aluminum chloride ?Outcome: patient tolerated procedure well with no complications   ?Post-procedure details: wound care instructions given   ? ?Specimen 3 - Surgical pathology ?Differential Diagnosis: bcc vs scc-txpbx ? ?Check Margins: No ? ? ? ?I, Robie Oats, PA-C, have reviewed all documentation's for this visit.  The documentation on 06/14/21 for the exam, diagnosis, procedures and orders are all accurate and complete. ?

## 2021-06-14 ENCOUNTER — Telehealth: Payer: Self-pay | Admitting: *Deleted

## 2021-06-14 NOTE — Telephone Encounter (Signed)
Path to patient. Patient has 6 month follow up scheduled.  ?

## 2021-06-14 NOTE — Telephone Encounter (Signed)
-----   Message from Warren Danes, [place] sent at 06/14/2021  1:20 PM EDT ----- ?Recheck 6 months. Call if recur. Check her status with healing.  ?

## 2021-06-17 ENCOUNTER — Other Ambulatory Visit: Payer: Self-pay | Admitting: Internal Medicine

## 2021-06-27 DIAGNOSIS — H10013 Acute follicular conjunctivitis, bilateral: Secondary | ICD-10-CM | POA: Diagnosis not present

## 2021-08-10 ENCOUNTER — Ambulatory Visit (HOSPITAL_COMMUNITY)
Admission: RE | Admit: 2021-08-10 | Discharge: 2021-08-10 | Disposition: A | Discharge status: Home / Self Care | Payer: Medicare Other | Source: Ambulatory Visit | Attending: Cardiovascular Disease | Admitting: Cardiovascular Disease

## 2021-08-10 DIAGNOSIS — I6523 Occlusion and stenosis of bilateral carotid arteries: Secondary | ICD-10-CM | POA: Insufficient documentation

## 2021-08-10 DIAGNOSIS — E785 Hyperlipidemia, unspecified: Secondary | ICD-10-CM | POA: Diagnosis not present

## 2021-08-15 ENCOUNTER — Other Ambulatory Visit: Payer: Self-pay | Admitting: *Deleted

## 2021-08-15 DIAGNOSIS — I6523 Occlusion and stenosis of bilateral carotid arteries: Secondary | ICD-10-CM

## 2021-09-27 DIAGNOSIS — H524 Presbyopia: Secondary | ICD-10-CM | POA: Diagnosis not present

## 2021-10-17 ENCOUNTER — Encounter: Payer: Self-pay | Admitting: Family Medicine

## 2021-10-17 ENCOUNTER — Ambulatory Visit (INDEPENDENT_AMBULATORY_CARE_PROVIDER_SITE_OTHER): Payer: Medicare Other | Admitting: Family Medicine

## 2021-10-17 VITALS — BP 144/83 | HR 62 | Temp 97.6°F | Ht 66.0 in | Wt 189.2 lb

## 2021-10-17 DIAGNOSIS — M8589 Other specified disorders of bone density and structure, multiple sites: Secondary | ICD-10-CM | POA: Diagnosis not present

## 2021-10-17 DIAGNOSIS — W57XXXA Bitten or stung by nonvenomous insect and other nonvenomous arthropods, initial encounter: Secondary | ICD-10-CM | POA: Diagnosis not present

## 2021-10-17 DIAGNOSIS — E785 Hyperlipidemia, unspecified: Secondary | ICD-10-CM

## 2021-10-17 DIAGNOSIS — I6523 Occlusion and stenosis of bilateral carotid arteries: Secondary | ICD-10-CM | POA: Diagnosis not present

## 2021-10-17 DIAGNOSIS — L570 Actinic keratosis: Secondary | ICD-10-CM

## 2021-10-17 NOTE — Patient Instructions (Signed)
Actinic Keratosis An actinic keratosis is a precancerous growth on the skin. If there is more than one, the condition is called actinic keratoses. These growths appear most often on parts of the skin that get a lot of sun exposure, including the: Scalp. Face. Ears. Lips. Upper back. Forearms. Backs of the hands. If left untreated, these growths may develop into a skin cancer called squamous cell carcinoma. It is important to have all these growths checked by a health care provider to determine the best treatment. What are the causes? Actinic keratoses are caused by getting too much ultraviolet (UV) radiation from the sun or other UV light sources. What increases the risk? You are more likely to develop this condition if you: Have light-colored skin or blue eyes. Have blond or red hair. Spend a lot of time in the sun. Do not protect your skin from the sun when outdoors. Are an older person. The risk of developing an actinic keratosis increases with age. What are the signs or symptoms? These growths feel like scaly, rough spots of skin. Symptoms of this condition include growths that may: Be as small as a pinhead or as big as a quarter. Itch, hurt, or feel sensitive. Be skin-colored, light tan, dark tan, pink, or a combination of these colors. In most cases, the growths become red. Have a small piece of pink or gray skin (skin tag) growing from them. It may be easier to notice the growths by feeling them rather than seeing them. Sometimes, actinic keratoses disappear but may return a few days to a few weeks later. How is this diagnosed? This condition is usually diagnosed with a physical exam. A tissue sample may be removed from the growth and examined under a microscope (biopsy). How is this treated? This condition may be treated by: Scraping off the actinic keratosis (curettage). Freezing the actinic keratosis with liquid nitrogen (cryosurgery). This causes the growth to eventually  fall off. Applying medicated creams or gels to destroy the cells in the growth. Applying chemicals to the growth to make the outer layers of skin peel off (chemical peel). Using photodynamic therapy. In this procedure, medicated cream is applied to the actinic keratosis. This cream increases your skin's sensitivity to light. Then, a strong light is aimed at the actinic keratosis to destroy cells in the growth. Follow these instructions at home: Skin care Apply cool, wet cloths (coolcompresses) to the affected areas. Do not scratch your skin. Check your skin regularly for any growths, especially ones that: Start to itch or bleed. Change in size, shape, or color. Caring for the treated area Keep the treated area clean and dry as told by your health care provider. Do not apply any medicine, cream, or lotion to the treated area unless your health care provider tells you to do that. Do not pick at blisters or try to break them open. This can cause infection and scarring. If you have red or irritated skin after treatment, follow instructions from your health care provider about how to take care of the treated area. Make sure you: Wash your hands with soap and water for at least 20 seconds before and after you change your bandage (dressing). If soap and water are not available, use hand sanitizer. Change your dressing as told by your health care provider. If you have red or irritated skin after treatment, check the treated area every day for signs of infection. Check for: Redness, swelling, or pain. Fluid or blood. Warmth. Pus or a bad  smell. Lifestyle Do not use any products that contain nicotine or tobacco. These products include cigarettes, chewing tobacco, and vaping devices, such as e-cigarettes. If you need help quitting, ask your health care provider. Take steps to protect your skin from the sun, such as: Avoiding the sun between 10:00 a.m. and 4:00 p.m. This is when the UV light is the  strongest. Using a sunscreen or sunblock with SPF 30 or greater. Applying sunscreen before you are exposed to sunlight and reapplying as often as told by the instructions on the sunscreen container. Wearing protective gear, including: Sunglasses with UV protection. A hat and clothing that protect your skin from sunlight. Avoiding medicines that increase your sensitivity to sunlight when possible. Avoidingtanning beds and other indoor tanning devices. General instructions Take or apply over-the-counter and prescription medicines only as told by your health care provider. Return to your normal activities as told by your health care provider. Ask your health care provider what activities are safe for you. Have a skin exam done every year by a health care provider who is a skin specialist (dermatologist). Keep all follow-up visits. Your health care provider will want to check that the site has healed after treatment. Contact a health care provider if: You notice any changes or new growths on your skin. You have swelling, pain, or redness around your treated area. You have fluid or blood coming from your treated area. Your treated area feels warm to the touch. You have pus or a bad smell coming from your treated area. You have a fever or chills. You have a blister that becomes large and painful. Summary An actinic keratosis is a precancerous growth on the skin.If left untreated, these growths can develop into skin cancer. Check your skin regularly for any growths, especially growths that start to itch or bleed, or change in size, shape, or color. Take steps to protect your skin from the sun. Contact a health care provider if you notice any changes or new growths on your skin. This information is not intended to replace advice given to you by your health care provider. Make sure you discuss any questions you have with your health care provider. Document Revised: 04/28/2021 Document Reviewed:  04/28/2021 Elsevier Patient Education  [employer].

## 2021-10-17 NOTE — Progress Notes (Signed)
Subjective: CC: Dermatologic concern PCP: Janora Norlander, DO [id number] C Rohde is a 76 y.o. female presenting to clinic today for:  1.  Skin lesion Patient reports a skin lesion along the left side of her neck/face.  She is under the care of Dr. Denna Haggard but unfortunately that practice has closed and she will need a new referral.  2.  Hyperlipidemia Patient is compliant with Crestor.  Reports of chest pain, shortness of breath or jaundice  3.  Tick bite She has sustained a couple of tick bites.  She notes that she worries about possible tickborne illness though she never had a target lesion, changes in arthralgia, fevers etc.  She seems to be "allergic to the sun".  She breaks out in a rash if she stays out more than 15 minutes at a time.  She also becomes very sweaty easily now and this was never the case before.   ROS: Per HPI  Allergies  Allergen Reactions   Penicillins Hives   Sulfa Antibiotics    Sulfamethoxazole Itching   Lipitor [Atorvastatin] Diarrhea   Crestor [Rosuvastatin Calcium] Other (See Comments)    Myalgias    Past Medical History:  Diagnosis Date   Carotid artery stenosis    Cataract    Diverticulosis    Fractured hand    right   Hyperlipidemia    Osteopenia    Precancerous skin lesion 2016   Squamous cell carcinoma of skin 12/02/2015   left forehead (CX35FU)   Squamous cell carcinoma of skin 12/02/2015   in situ- right cheek (CX35FU)``   Squamous cell carcinoma of skin 07/13/2017   in situ-left forehead/hairline   Squamous cell carcinoma of skin 11/13/2017   in situ- left forehead medial (TXPBX)    Current Outpatient Medications:    aspirin EC 81 MG tablet, Take 81 mg by mouth daily., Disp: , Rfl:    Calcium 500-125 MG-UNIT TABS, 1 tablet. 1 tablet daily., Disp: , Rfl:    Cholecalciferol (VITAMIN D3) 2000 units capsule, 1 capsule., Disp: , Rfl:    doxycycline (VIBRA-TABS) 100 MG tablet, Take 1 tablet (100 mg total) by mouth 2 (two) times  daily., Disp: 14 tablet, Rfl: 0   fluticasone (FLONASE) 50 MCG/ACT nasal spray, Place 2 sprays into both nostrils daily., Disp: 16 g, Rfl: 6   nitrofurantoin, macrocrystal-monohydrate, (MACROBID) 100 MG capsule, Take 1 capsule (100 mg total) by mouth 2 (two) times daily. 1 po BId, Disp: 14 capsule, Rfl: 0   phenazopyridine (PYRIDIUM) 95 MG tablet, Take 1 tablet (95 mg total) by mouth 3 (three) times daily as needed for pain., Disp: 10 tablet, Rfl: 0   REPATHA SURECLICK 194 MG/ML SOAJ, INJECT 1 DOSE SUBCUTANEOUSLY EVERY 14 DAYS, Disp: 6 mL, Rfl: 3   rosuvastatin (CRESTOR) 5 MG tablet, TAKE 1/2 (ONE-HALF) TABLET BY MOUTH THREE TIMES A WEEK, Disp: 18 tablet, Rfl: 3 Social History   Socioeconomic History   Marital status: Widowed    Spouse name: Not on file   Number of children: 2   Years of education: 12   Highest education level: High school graduate  Occupational History   Occupation: retired  Tobacco Use   Smoking status: Never   Smokeless tobacco: Never  Vaping Use   Vaping Use: Never used  Substance and Sexual Activity   Alcohol use: No   Drug use: No   Sexual activity: Not Currently  Other Topics Concern   Not on file  Social History Narrative   Lives  alone. Has a basement   One child in [place], one in [place] Strain: Low Risk  (12/07/2020)   Overall Financial Resource Strain (CARDIA)    Difficulty of Paying Living Expenses: Not hard at all  Food Insecurity: No Food Insecurity (12/07/2020)   Hunger Vital Sign    Worried About Running Out of Food in the Last Year: Never true    Ran Out of Food in the Last Year: Never true  Transportation Needs: No Transportation Needs (12/07/2020)   PRAPARE - Hydrologist (Medical): No    Lack of Transportation (Non-Medical): No  Physical Activity: Sufficiently Active (12/07/2020)   Exercise Vital Sign    Days of Exercise per Week: 5 days    Minutes  of Exercise per Session: 30 min  Stress: No Stress Concern Present (12/07/2020)   [place]    Feeling of Stress : Not at all  Social Connections: Moderately Integrated (12/07/2020)   Social Connection and Isolation Panel [NHANES]    Frequency of Communication with Friends and Family: More than three times a week    Frequency of Social Gatherings with Friends and Family: More than three times a week    Attends Religious Services: More than 4 times per year    Active Member of [employer] or Organizations: Yes    Attends Archivist Meetings: More than 4 times per year    Marital Status: Widowed  Intimate Partner Violence: Not At Risk (12/07/2020)   Humiliation, Afraid, Rape, and Kick questionnaire    Fear of Current or Ex-Partner: No    Emotionally Abused: No    Physically Abused: No    Sexually Abused: No   Family History  Problem Relation Age of Onset   Heart disease Mother    Heart disease Father    Cancer Sister        breast   Hyperlipidemia Sister    Diabetes Sister    Cancer Sister        bone   Alzheimer's disease Sister    Heart disease Brother    CAD Brother    Cancer Sister        breast   Kidney disease Sister    Heart disease Brother    Hypertension Brother    Alzheimer's disease Brother    Kidney disease Brother        2018 starting dialysis    Objective: Office vital signs reviewed. BP (!) 144/83   Pulse 62   Temp 97.6 F (36.4 C)   Ht $R'5\' 6"'cJ$  (1.676 m)   Wt 189 lb 3.2 oz (85.8 kg)   SpO2 96%   BMI 30.54 kg/m   Physical Examination:  General: Awake, alert, well nourished, No acute distress HEENT: Sclera white.  Moist mucous membranes. Cardio: regular rate and rhythm, S1S2 heard, no murmurs appreciated Pulm: clear to auscultation bilaterally, no wheezes, rhonchi or rales; normal work of breathing on room air Extremities: warm, well perfused, No edema, cyanosis or clubbing; +2  pulses bilaterally Skin: Hyperkeratotic, flesh-colored patch of skin noted along the left angle of the jaw.  No hypervascularization  Cryotherapy Procedure:  Risks and benefits of procedure were reviewed with the patient.  Written consent obtained and scanned into the chart.  Lesion of concern was identified and located on left angle of jaw.  Liquid nitrogen was applied to area of concern and  extending out 1 millimeters beyond the border of the lesion.  Treated area was allowed to come back to room temperature before treating it a second time.  Patient tolerated procedure well and there were no immediate complications.  Home care instructions were reviewed with the patient and a handout was provided.  Assessment/ Plan: 76 y.o. female   Hyperlipidemia LDL goal <70 - Plan: CMP14+EGFR, Lipid Panel, TSH  Bilateral carotid artery stenosis - Plan: CMP14+EGFR, Lipid Panel, TSH  Osteopenia of multiple sites - Plan: CMP14+EGFR, CBC, TSH, DG [facility] DEXA  Tick bite, unspecified site, initial encounter - Plan: Alpha-Gal Panel  Actinic keratoses - Plan: Ambulatory referral to Dermatology  Check fasting labs.  Continue to follow-up with specialty as directed for bilateral carotid artery stenosis  Check calcium level given known osteopenia.  Bone density scan overdue so this has been ordered.  Continue appropriate calcium, vitamin D and weightbearing exercise  Wanted to have some lab testing done given ongoing rashes, pork intolerance and history of tick bite.  Alpha gal ordered  Referral to new dermatologist placed but the actinic keratosis along the left jaw was treated today with cryoablation.  Home care instructions reviewed and reasons for reevaluation discussed  No orders of the defined types were placed in this encounter.  No orders of the defined types were placed in this encounter.    Janora Norlander, DO [place] [phone]

## 2021-10-19 LAB — CMP14+EGFR
ALT: 16 IU/L (ref 0–32)
AST: 16 IU/L (ref 0–40)
Albumin/Globulin Ratio: 1.6 (ref 1.2–2.2)
Albumin: 4.6 g/dL (ref 3.8–4.8)
Alkaline Phosphatase: 84 IU/L (ref 44–121)
BUN/Creatinine Ratio: 15 (ref 12–28)
BUN: 15 mg/dL (ref 8–27)
Bilirubin Total: 0.9 mg/dL (ref 0.0–1.2)
CO2: 25 mmol/L (ref 20–29)
Calcium: 9.6 mg/dL (ref 8.7–10.3)
Chloride: 106 mmol/L (ref 96–106)
Creatinine, Ser: 1.03 mg/dL — ABNORMAL HIGH (ref 0.57–1.00)
Globulin, Total: 2.8 g/dL (ref 1.5–4.5)
Glucose: 99 mg/dL (ref 70–99)
Potassium: 5.2 mmol/L (ref 3.5–5.2)
Sodium: 145 mmol/L — ABNORMAL HIGH (ref 134–144)
Total Protein: 7.4 g/dL (ref 6.0–8.5)
eGFR: 56 mL/min/{1.73_m2} — ABNORMAL LOW (ref >59)

## 2021-10-19 LAB — CBC
Hematocrit: 44.1 % (ref 34.0–46.6)
Hemoglobin: 14.8 g/dL (ref 11.1–15.9)
MCH: 30.2 pg (ref 26.6–33.0)
MCHC: 33.6 g/dL (ref 31.5–35.7)
MCV: 90 fL (ref 79–97)
Platelets: 191 10*3/uL (ref 150–450)
RBC: 4.9 x10E6/uL (ref 3.77–5.28)
RDW: 13 % (ref 11.7–15.4)
WBC: 6.2 10*3/uL (ref 3.4–10.8)

## 2021-10-19 LAB — ALPHA-GAL PANEL
Allergen Lamb IgE: <0.1 kU/L
Beef IgE: <0.1 kU/L
IgE (Immunoglobulin E), Serum: 51 IU/mL (ref 6–495)
O215-IgE Alpha-Gal: <0.1 kU/L
Pork IgE: <0.1 kU/L

## 2021-10-19 LAB — LIPID PANEL
Chol/HDL Ratio: 2.7 ratio (ref 0.0–4.4)
Cholesterol, Total: 133 mg/dL (ref 100–199)
HDL: 50 mg/dL (ref >39)
LDL Chol Calc (NIH): 43 mg/dL (ref 0–99)
Triglycerides: 261 mg/dL — ABNORMAL HIGH (ref 0–149)
VLDL Cholesterol Cal: 40 mg/dL (ref 5–40)

## 2021-10-19 LAB — TSH: TSH: 2.43 u[IU]/mL (ref 0.450–4.500)

## 2021-10-21 ENCOUNTER — Other Ambulatory Visit: Payer: Self-pay | Admitting: Family Medicine

## 2021-10-21 DIAGNOSIS — L570 Actinic keratosis: Secondary | ICD-10-CM

## 2021-11-23 ENCOUNTER — Other Ambulatory Visit: Payer: Self-pay | Admitting: Family Medicine

## 2021-11-28 DIAGNOSIS — L578 Other skin changes due to chronic exposure to nonionizing radiation: Secondary | ICD-10-CM | POA: Diagnosis not present

## 2021-11-28 DIAGNOSIS — C4441 Basal cell carcinoma of skin of scalp and neck: Secondary | ICD-10-CM | POA: Diagnosis not present

## 2021-11-28 DIAGNOSIS — C44519 Basal cell carcinoma of skin of other part of trunk: Secondary | ICD-10-CM | POA: Diagnosis not present

## 2021-11-28 DIAGNOSIS — L570 Actinic keratosis: Secondary | ICD-10-CM | POA: Diagnosis not present

## 2021-12-08 ENCOUNTER — Ambulatory Visit (INDEPENDENT_AMBULATORY_CARE_PROVIDER_SITE_OTHER): Payer: Medicare Other

## 2021-12-08 ENCOUNTER — Ambulatory Visit: Payer: Medicare Other | Admitting: Physician Assistant

## 2021-12-08 DIAGNOSIS — Z0000 Encounter for general adult medical examination without abnormal findings: Secondary | ICD-10-CM | POA: Diagnosis not present

## 2021-12-08 NOTE — Progress Notes (Signed)
MEDICARE ANNUAL WELLNESS VISIT  12/08/2021  Telephone Visit Disclaimer This Medicare AWV was conducted by telephone due to national recommendations for restrictions regarding the COVID-19 Pandemic (e.g. social distancing).  I verified, using two identifiers, that I am speaking with Carol [name] or their authorized healthcare agent. I discussed the limitations, risks, security, and privacy concerns of performing an evaluation and management service by telephone and the potential availability of an in-person appointment in the future. The patient expressed understanding and agreed to proceed.  Location of Patient: Home Location of Provider (nurse):  [facility]  Subjective:    Carol [name] is a 76 y.o. female patient of Janora Norlander, DO who had a Medicare Annual Wellness Visit today via telephone. Carol [name] is a very pleasant lady. She lives locally here in [place]. She is a widow and lives alone. She has two children, one daughter that lives in [place] and a son that lives in [place]. She is retired from [employer] where she worked for 30 years. She is very active and works outdoors. She states that she used to go to the [facility] but hasn't in 2 years. She and her sisters have recently spoken about going back and plan to do so very soon. She push mows and splits wood. She is very involved in her church and enjoys traveling and casinos. She eats 3 meals a day and occasionally has snacks such as a few nabs or a few cashews.   Patient Care Team: Janora Norlander, DO as PCP - General (Family Medicine) Debara Pickett Nadean Corwin, MD as PCP - Cardiology (Cardiology) Lavonna Monarch, MD (Inactive) as Consulting Physician (Dermatology) Luberta Mutter, MD as Consulting Physician (Ophthalmology) Ronald Lobo, MD as Consulting Physician (Gastroenterology) Debara Pickett Nadean Corwin, MD as Consulting Physician (Cardiology) Starlyn Skeans as Physician  Assistant (Dermatology)     12/08/2021    8:58 AM 12/07/2020    8:33 AM 12/04/2019    8:43 AM 09/26/2019   12:01 PM 12/03/2018    8:42 AM 11/27/2017    9:07 AM 02/03/2016    8:57 AM  Advanced Directives  Does Patient Have a Medical Advance Directive? Yes No Yes Yes Yes Yes Yes  Type of Paramedic of [place];Living will  [place];Living will [place];Living will Living will;Healthcare Power of [place];Living will [place];Living will  Does patient want to make changes to medical advance directive? No - Guardian declined  No - Patient declined  No - Patient declined No - Patient declined No - Patient declined  Copy of [place] in Chart? No - copy requested  No - copy requested  No - copy requested No - copy requested No - copy requested  Would patient like information on creating a medical advance directive?  No - Patient declined    No - Patient declined     Hospital Utilization Over the Past 12 Months: # of hospitalizations or ER visits: 0 # of surgeries: 0  Review of Systems    Patient reports that her overall health is unchanged compared to last year.  History obtained from chart review  Patient Reported Readings (BP, Pulse, CBG, Weight, etc) none  Pain Assessment Pain : No/denies pain     Current Medications & Allergies (verified) Allergies as of 12/08/2021       Reactions   Penicillins Hives   Sulfa Antibiotics  Sulfamethoxazole Itching   Lipitor [atorvastatin] Diarrhea   Crestor [rosuvastatin Calcium] Other (See Comments)   Myalgias        Medication List        Accurate as of December 08, 2021  9:16 AM. If you have any questions, ask your nurse or doctor.          aspirin EC 81 MG tablet Take 81 mg by mouth daily.   Calcium 500-125 MG-UNIT Tabs 1 tablet. 1 tablet daily.   fluticasone 50 MCG/ACT nasal spray Commonly  known as: FLONASE Place 2 sprays into both nostrils daily.   phenazopyridine 95 MG tablet Commonly known as: PYRIDIUM Take 1 tablet (95 mg total) by mouth 3 (three) times daily as needed for pain.   Repatha SureClick 102 MG/ML Soaj Generic drug: Evolocumab INJECT 1 DOSE SUBCUTANEOUSLY EVERY 14 DAYS   rosuvastatin 5 MG tablet Commonly known as: CRESTOR TAKE 1/2 (ONE-HALF) TABLET BY MOUTH THREE TIMES A WEEK   Vitamin D3 50 MCG (2000 UT) capsule 1 capsule.        History (reviewed): Past Medical History:  Diagnosis Date   Carotid artery stenosis    Cataract    Diverticulosis    Fractured hand    right   Hyperlipidemia    Osteopenia    Precancerous skin lesion 2016   Squamous cell carcinoma of skin 12/02/2015   left forehead (CX35FU)   Squamous cell carcinoma of skin 12/02/2015   in situ- right cheek (CX35FU)``   Squamous cell carcinoma of skin 07/13/2017   in situ-left forehead/hairline   Squamous cell carcinoma of skin 11/13/2017   in situ- left forehead medial (TXPBX)   Past Surgical History:  Procedure Laterality Date   CATARACT EXTRACTION W/ INTRAOCULAR LENS IMPLANT Bilateral    CHOLECYSTECTOMY  2009   EYE SURGERY     hand fracture surgery     Family History  Problem Relation Age of Onset   Heart disease Mother    Heart disease Father    Cancer Sister        breast   Hyperlipidemia Sister    Diabetes Sister    Cancer Sister        bone   Alzheimer's disease Sister    Cancer Sister        breast   Kidney disease Sister    Heart disease Brother    CAD Brother    Heart disease Brother    Hypertension Brother    Alzheimer's disease Brother    Kidney disease Brother        2018 starting dialysis   Social History   Socioeconomic History   Marital status: Widowed    Spouse name: Not on file   Number of children: 2   Years of education: 12   Highest education level: High school graduate  Occupational History   Occupation: retired  Tobacco Use    Smoking status: Never   Smokeless tobacco: Never  Vaping Use   Vaping Use: Never used  Substance and Sexual Activity   Alcohol use: No   Drug use: No   Sexual activity: Not Currently  Other Topics Concern   Not on file  Social History Narrative   Lives alone. Has a basement   One child in [place], one in [place] Strain: Low Risk  (12/07/2020)   Overall Financial Resource Strain (CARDIA)    Difficulty of Paying Living Expenses: Not hard at all  Food Insecurity: No Food Insecurity (12/07/2020)   Hunger Vital Sign    Worried About Running Out of Food in the Last Year: Never true    Ran Out of Food in the Last Year: Never true  Transportation Needs: No Transportation Needs (12/07/2020)   PRAPARE - Hydrologist (Medical): No    Lack of Transportation (Non-Medical): No  Physical Activity: Sufficiently Active (12/07/2020)   Exercise Vital Sign    Days of Exercise per Week: 5 days    Minutes of Exercise per Session: 30 min  Stress: No Stress Concern Present (12/07/2020)   [place]    Feeling of Stress : Not at all  Social Connections: Moderately Integrated (12/07/2020)   Social Connection and Isolation Panel [NHANES]    Frequency of Communication with Friends and Family: More than three times a week    Frequency of Social Gatherings with Friends and Family: More than three times a week    Attends Religious Services: More than 4 times per year    Active Member of [employer] or Organizations: Yes    Attends Archivist Meetings: More than 4 times per year    Marital Status: Widowed    Activities of Daily Living    12/08/2021    8:59 AM  In your present state of health, do you have any difficulty performing the following activities:  Hearing? 0  Vision? 0  Difficulty concentrating or making decisions? 0  Walking or  climbing stairs? 0  Dressing or bathing? 0  Doing errands, shopping? 0  Preparing Food and eating ? N  Using the Toilet? N  In the past six months, have you accidently leaked urine? N  Do you have problems with loss of bowel control? N  Managing your Medications? N  Managing your Finances? N  Housekeeping or managing your Housekeeping? N    Patient Education/ Literacy How often do you need to have someone help you when you read instructions, pamphlets, or other written materials from your doctor or pharmacy?: 1 - Never What is the last grade level you completed in school?: 12th grade  Exercise Current Exercise Habits: The patient does not participate in regular exercise at present, Exercise limited by: None identified  Diet Patient reports consuming 3 meals a day and 1 snack(s) a day Patient reports that her primary diet is: Regular Patient reports that she does have regular access to food.   Depression Screen    12/08/2021    8:59 AM 10/17/2021    9:14 AM 05/31/2021    8:24 AM 04/25/2021   12:46 PM 12/07/2020    8:30 AM 09/27/2020    8:00 AM 03/29/2020    8:13 AM  PHQ 2/9 Scores  PHQ - 2 Score 0 0 0 0 0 0 0  PHQ- 9 Score   0 0        Fall Risk    12/08/2021    8:59 AM 10/17/2021    9:33 AM 10/17/2021    9:14 AM 05/31/2021    8:25 AM 04/25/2021   12:48 PM  Fall Risk   Falls in the past year? 0 0 0 0 0     Objective:  Carol [name] seemed alert and oriented and she participated appropriately during our telephone visit.  Blood Pressure Weight BMI  BP Readings from Last 3 Encounters:  10/17/21 (!) 144/83  05/31/21 (!) 154/82  04/25/21 (!) 171/76  Wt Readings from Last 3 Encounters:  10/17/21 189 lb 3.2 oz (85.8 kg)  05/31/21 191 lb (86.6 kg)  04/25/21 190 lb (86.2 kg)   BMI Readings from Last 1 Encounters:  10/17/21 30.54 kg/m    *Unable to obtain current vital signs, weight, and BMI due to telephone visit type  Hearing/Vision  Carol [name] did not seem to have  difficulty with hearing/understanding during the telephone conversation  Pt wears a hearing aid Reports that she has had a formal eye exam by an eye care professional within the past year Reports that she has had a formal hearing evaluation within the past year *Unable to fully assess hearing and vision during telephone visit type  Cognitive Function:    12/08/2021    9:03 AM 12/04/2019    8:38 AM 12/03/2018    8:45 AM  6CIT Screen  What Year? 0 points 0 points 0 points  What month? 0 points 0 points 0 points  What time? 0 points 0 points 0 points  Count back from 20 0 points 0 points 0 points  Months in reverse 0 points 0 points 0 points  Repeat phrase 0 points 0 points 0 points  Total Score 0 points 0 points 0 points   (Normal:0-7, Significant for Dysfunction: >8)  Normal Cognitive Function Screening: Yes   Immunization & Health Maintenance Record Immunization History  Administered Date(s) Administered   Influenza,inj,Quad PF,6+ Mos 11/26/2012, 11/26/2013, 12/04/2014   Moderna Sars-Covid-2 Vaccination 04/10/2019, 05/09/2019   Tdap 02/27/2006   Zoster, Live 12/19/2013    Health Maintenance  Topic Date Due   COVID-19 Vaccine (3 - Moderna risk series) 06/06/2019   DEXA SCAN  05/14/2021   Zoster Vaccines- Shingrix (1 of 2) 03/10/2022 (Originally 09/25/1964)   INFLUENZA VACCINE  05/28/2022 (Originally 09/27/2021)   Pneumonia Vaccine 56+ Years old (1 - PCV) 12/09/2022 (Originally 09/24/2008)   COLONOSCOPY (Pts 45-8yr Insurance coverage will need to be confirmed)  12/09/2022 (Originally 12/08/2021)   TETANUS/TDAP  12/09/2022 (Originally 02/28/2016)   MAMMOGRAM  03/14/2022   Hepatitis C Screening  Completed   HPV VACCINES  Aged Out       Assessment  This is a routine wellness examination for Carol [name]  Health Maintenance: Due or Overdue Health Maintenance Due  Topic Date Due   COVID-19 Vaccine (3 - Moderna risk series) 06/06/2019   DEXA SCAN  05/14/2021    Carol Lexdoes not need a referral for Community Assistance: Care Management:   no Social Work:    no Prescription Assistance:  no Nutrition/Diabetes Education:  no   Plan:  Personalized Goals  Goals Addressed             This Visit's Progress    Exercise 150 min/wk Moderate Activity         Personalized Health Maintenance & Screening Recommendations  Bone densitometry screening  Lung Cancer Screening Recommended: no (Low Dose CT Chest recommended if Age 78-80years, 30 pack-year currently smoking OR have quit w/in past 15 years) Hepatitis C Screening recommended: Done HIV Screening recommended: no  Advanced Directives: Written information was not prepared per patient's request.  Referrals & Orders No orders of the defined types were placed in this encounter.   Follow-up Plan Follow-up with GJanora Norlander DO as planned Patient already scheduled for chronic follow up at the end of this month Needs bone density testing which she is also scheduled for Patient declines all immunizations at this time   I have  personally reviewed and noted the following in the patient's chart:   Medical and social history Use of alcohol, tobacco or illicit drugs  Current medications and supplements Functional ability and status Nutritional status Physical activity Advanced directives List of other physicians Hospitalizations, surgeries, and ER visits in previous 12 months Vitals Screenings to include cognitive, depression, and falls Referrals and appointments  In addition, I have reviewed and discussed with Carol [name] certain preventive protocols, quality metrics, and best practice recommendations. A written personalized care plan for preventive services as well as general preventive health recommendations is available and can be mailed to the patient at her request.      Rolena Infante LPN [id number]

## 2021-12-21 ENCOUNTER — Ambulatory Visit (INDEPENDENT_AMBULATORY_CARE_PROVIDER_SITE_OTHER): Payer: Medicare Other | Admitting: Family Medicine

## 2021-12-21 ENCOUNTER — Encounter: Payer: Self-pay | Admitting: Family Medicine

## 2021-12-21 ENCOUNTER — Ambulatory Visit (INDEPENDENT_AMBULATORY_CARE_PROVIDER_SITE_OTHER): Payer: Medicare Other

## 2021-12-21 VITALS — BP 139/73 | HR 68 | Temp 98.6°F | Ht 66.0 in | Wt 192.8 lb

## 2021-12-21 DIAGNOSIS — E785 Hyperlipidemia, unspecified: Secondary | ICD-10-CM

## 2021-12-21 DIAGNOSIS — Z0000 Encounter for general adult medical examination without abnormal findings: Secondary | ICD-10-CM

## 2021-12-21 DIAGNOSIS — Z0001 Encounter for general adult medical examination with abnormal findings: Secondary | ICD-10-CM | POA: Diagnosis not present

## 2021-12-21 DIAGNOSIS — M8589 Other specified disorders of bone density and structure, multiple sites: Secondary | ICD-10-CM

## 2021-12-21 DIAGNOSIS — I6523 Occlusion and stenosis of bilateral carotid arteries: Secondary | ICD-10-CM

## 2021-12-21 DIAGNOSIS — M8588 Other specified disorders of bone density and structure, other site: Secondary | ICD-10-CM

## 2021-12-21 DIAGNOSIS — M85852 Other specified disorders of bone density and structure, left thigh: Secondary | ICD-10-CM | POA: Diagnosis not present

## 2021-12-21 NOTE — Progress Notes (Signed)
I have separately seen and examined the patient. I have discussed the findings and exam with Student Dr Carol [name] and agree with the above note.  My changes/additions are outlined in BLUE.   S: [name] is here for her annual physical exam.  She overall is doing very well.  Has seen cardiology for carotid stenosis, hyperlipidemia.  She is compliant with her medications.  She is nonfasting so would like to come back tomorrow for repeat labs as her triglycerides were noted to be elevated upon last draw.  Also noted to have slight reduction in her renal function so would like that repeated.  She continues to maintain a very balanced lifestyle with lean meats, high fiber and physical activity.  She just joined the [employer] in [place].  No reports of chest [name], shortness of breath, dizziness, headache, visual disturbance.  O: BP 139/73   Pulse 68   Temp 98.6 F (37 C) (Temporal)   Ht '5\' 6"'$  (1.676 m)   Wt 192 lb 12.8 oz (87.5 kg)   SpO2 95%   BMI 31.12 kg/m  General appearance: alert, cooperative, appears stated age, no distress, and mildly obese Head: Normocephalic, without obvious abnormality, atraumatic Eyes: negative findings: lids and lashes normal, conjunctivae and sclerae normal, corneas clear, and pupils equal, round, reactive to light and accomodation Ears: normal TM's and external ear canals both ears Nose: Nares normal. Septum midline. Mucosa normal. No drainage or sinus tenderness. Throat: lips, mucosa, and tongue normal; teeth and gums normal Neck: no adenopathy, supple, symmetrical, trachea midline, and thyroid not enlarged, symmetric, no tenderness/mass/nodules Back: symmetric, no curvature. ROM normal. No CVA tenderness. Lungs: clear to auscultation bilaterally Heart: regular rate and rhythm, S1, S2 normal, no murmur, click, rub or gallop Abdomen: soft, non-tender; bowel sounds normal; no masses,  no organomegaly Extremities: extremities normal, atraumatic, no cyanosis or  edema Pulses: 2+ and symmetric Skin: Skin color, texture, turgor normal.  Mild hyperemia of the cheeks bilaterally with a scabbed over lesion on the right ala. Lymph nodes: Cervical, supraclavicular, and axillary nodes normal. Neurologic: Grossly normal Psych: Very pleasant, interactive  Flowsheet Row Office Visit from 12/21/2021 in [place]  PHQ-2 Total Score 0      A/P:  Annual physical exam  Hyperlipidemia LDL goal <70 - Plan: Basic Metabolic Panel, Lipid panel, Lipid panel, Basic Metabolic Panel  Bilateral carotid artery stenosis - Plan: Basic Metabolic Panel, Lipid panel, Lipid panel, Basic Metabolic Panel  Osteopenia of multiple sites  Right groin [name]  We will update DEXA scan today given known osteopenia.  Continue balanced diet with high fiber, adequate vitamin D and calcium and regular weightbearing exercise.  Declined examination today  Repeat fasting labs have been placed so that she can return tomorrow to have these done  [place] M. Lajuana Ripple, DO [facility]   ----------------------------------------------------------------------------------------------------------------------------------    Carol [name] is a 76 y.o. female presents to office today for annual physical exam examination.    Concerns today include:  Skin Lesion No concerns at this time. Is seeing dermatology. is receiving topical 5-FU. Has a follow up with dermatology in three months   Hyperlipidemia  Patient complies with her prescribed medication, Crestor, for the management of hyperlipidemia. She does not report any symptoms such as chest [name], shortness of breath, or jaundice, and there is no evidence of chest [name], shortness of breath, dizziness, or alterations in exercise tolerance. Furthermore, she maintains an annual routine of cardiovascular check-ups, which consistently reveals  a stable cardiac condition. Notably, has significant family  history of hyperlipidemia.  Patient's brother, who had a history of Alzheimer's, passed away due to a stroke in the past year.  Osteopenia  Compliant with calcium and vitamin D supplementation. She has a DEXA scan scheduled in the coming weeks   Social support and Falls Safety:  The patient currently resides at home and reports having a robust social support system in place. Her husband passed away in 05/19/2007. However, she enjoys the companionship and support of her sister, daughter, friends, and her church community. Patient has a passionate interest in gardening and has preserved 160 quarts of green beans. She has not experienced any falls in the past year, and safety measures, such as handrails around the stairs, have been implemented. Has recently started going to the [facility] to start walking.   Hypertension:  Measures blood pressures at home, measures 130s/70s at home.   Occupation: retired, Marital status: widowed, Substance use: none Diet: balanced, Exercise: walking and gardening Last colonoscopy: 05/24/2021  Denies immunizations  Immunizations needed: Immunization History  Administered Date(s) Administered   Influenza,inj,Quad PF,6+ Mos 11/26/2012, 11/26/2013, 12/04/2014   Moderna Sars-Covid-2 Vaccination 04/10/2019, 05/09/2019   Tdap 02/27/2006   Zoster, Live 12/19/2013   Past Medical History:  Diagnosis Date   Carotid artery stenosis    Cataract    Diverticulosis    Fractured hand    right   Hyperlipidemia    Osteopenia    Precancerous skin lesion 2014-05-19   Squamous cell carcinoma of skin 12/02/2015   left forehead (CX35FU)   Squamous cell carcinoma of skin 12/02/2015   in situ- right cheek (CX35FU)``   Squamous cell carcinoma of skin 07/13/2017   in situ-left forehead/hairline   Squamous cell carcinoma of skin 11/13/2017   in situ- left forehead medial (TXPBX)   Social History   Socioeconomic History   Marital status: Widowed    Spouse name: Not on file   Number of  children: 2   Years of education: 12   Highest education level: High school graduate  Occupational History   Occupation: retired    Fish farm manager: [employer]  Tobacco Use   Smoking status: Never   Smokeless tobacco: Never  Vaping Use   Vaping Use: Never used  Substance and Sexual Activity   Alcohol use: No   Drug use: No   Sexual activity: Not Currently  Other Topics Concern   Not on file  Social History Narrative   Lives alone. Has a basement   One child in [place], one in [place] Strain: Low Risk  (12/07/2020)   Overall Financial Resource Strain (CARDIA)    Difficulty of Paying Living Expenses: Not hard at all  Food Insecurity: No Food Insecurity (12/07/2020)   Hunger Vital Sign    Worried About Running Out of Food in the Last Year: Never true    Ran Out of Food in the Last Year: Never true  Transportation Needs: No Transportation Needs (12/07/2020)   PRAPARE - Hydrologist (Medical): No    Lack of Transportation (Non-Medical): No  Physical Activity: Sufficiently Active (12/07/2020)   Exercise Vital Sign    Days of Exercise per Week: 5 days    Minutes of Exercise per Session: 30 min  Stress: No Stress Concern Present (12/07/2020)   [place]    Feeling of Stress : Not  at all  Social Connections: Moderately Integrated (12/07/2020)   Social Connection and Isolation Panel [NHANES]    Frequency of Communication with Friends and Family: More than three times a week    Frequency of Social Gatherings with Friends and Family: More than three times a week    Attends Religious Services: More than 4 times per year    Active Member of [employer] or Organizations: Yes    Attends Archivist Meetings: More than 4 times per year    Marital Status: Widowed  Intimate Partner Violence: Not At Risk (12/07/2020)   Humiliation,  Afraid, Rape, and Kick questionnaire    Fear of Current or Ex-Partner: No    Emotionally Abused: No    Physically Abused: No    Sexually Abused: No   Past Surgical History:  Procedure Laterality Date   CATARACT EXTRACTION W/ INTRAOCULAR LENS IMPLANT Bilateral    CHOLECYSTECTOMY  2009   EYE SURGERY     hand fracture surgery     Family History  Problem Relation Age of Onset   Heart disease Mother    Heart disease Father    Cancer Sister        breast   Hyperlipidemia Sister    Diabetes Sister    Cancer Sister        bone   Alzheimer's disease Sister    Cancer Sister        breast   Kidney disease Sister    Heart disease Brother    CAD Brother    Heart disease Brother    Hypertension Brother    Alzheimer's disease Brother    Kidney disease Brother        2018 starting dialysis    Current Outpatient Medications:    aspirin EC 81 MG tablet, Take 81 mg by mouth daily., Disp: , Rfl:    Calcium 500-125 MG-UNIT TABS, 1 tablet. 1 tablet daily., Disp: , Rfl:    Cholecalciferol (VITAMIN D3) 2000 units capsule, 1 capsule., Disp: , Rfl:    REPATHA SURECLICK 127 MG/ML SOAJ, INJECT 1 DOSE SUBCUTANEOUSLY EVERY 14 DAYS, Disp: 6 mL, Rfl: 3   rosuvastatin (CRESTOR) 5 MG tablet, TAKE 1/2 (ONE-HALF) TABLET BY MOUTH THREE TIMES A WEEK, Disp: 18 tablet, Rfl: 0   fluticasone (FLONASE) 50 MCG/ACT nasal spray, Place 2 sprays into both nostrils daily. (Patient not taking: Reported on 12/21/2021), Disp: 16 g, Rfl: 6   phenazopyridine (PYRIDIUM) 95 MG tablet, Take 1 tablet (95 mg total) by mouth 3 (three) times daily as needed for [name]. (Patient not taking: Reported on 12/21/2021), Disp: 10 tablet, Rfl: 0  Allergies  Allergen Reactions   Penicillins Hives   Sulfa Antibiotics    Sulfamethoxazole Itching   Lipitor [Atorvastatin] Diarrhea    ROS: Review of Systems A comprehensive review of systems was negative.    Physical exam BP 139/73   Pulse 68   Temp 98.6 F (37 C) (Temporal)   Ht 5'  6" (1.676 m)   Wt 192 lb 12.8 oz (87.5 kg)   SpO2 95%   BMI 31.12 kg/m  General appearance: alert and cooperative Lungs: clear to auscultation bilaterally and normal percussion bilaterally Heart: regular rate and rhythm, S1, S2 normal, no murmur, click, rub or gallop Abdomen: soft, non-tender; bowel sounds normal; no masses,  no organomegaly  Assessment/ Plan: Carol [name] here for annual physical exam.  See attending note. As above.

## 2021-12-22 LAB — LIPID PANEL
Chol/HDL Ratio: 2.5 ratio (ref 0.0–4.4)
Cholesterol, Total: 123 mg/dL (ref 100–199)
HDL: 50 mg/dL (ref >39)
LDL Chol Calc (NIH): 35 mg/dL (ref 0–99)
Triglycerides: 247 mg/dL — ABNORMAL HIGH (ref 0–149)
VLDL Cholesterol Cal: 38 mg/dL (ref 5–40)

## 2021-12-22 LAB — BASIC METABOLIC PANEL
BUN/Creatinine Ratio: 14 (ref 12–28)
BUN: 15 mg/dL (ref 8–27)
CO2: 23 mmol/L (ref 20–29)
Calcium: 10 mg/dL (ref 8.7–10.3)
Chloride: 104 mmol/L (ref 96–106)
Creatinine, Ser: 1.08 mg/dL — ABNORMAL HIGH (ref 0.57–1.00)
Glucose: 99 mg/dL (ref 70–99)
Potassium: 5 mmol/L (ref 3.5–5.2)
Sodium: 145 mmol/L — ABNORMAL HIGH (ref 134–144)
eGFR: 53 mL/min/{1.73_m2} — ABNORMAL LOW (ref >59)

## 2021-12-29 ENCOUNTER — Telehealth: Payer: Self-pay | Admitting: Internal Medicine

## 2021-12-29 NOTE — Telephone Encounter (Signed)
PA for repatha sureclick submitted via CMM (Key: [id number])

## 2022-01-24 ENCOUNTER — Encounter: Payer: Self-pay | Admitting: Family Medicine

## 2022-01-24 ENCOUNTER — Ambulatory Visit (INDEPENDENT_AMBULATORY_CARE_PROVIDER_SITE_OTHER): Payer: Medicare Other | Admitting: Family Medicine

## 2022-01-24 VITALS — BP 166/78 | HR 63 | Temp 97.0°F | Ht 66.0 in | Wt 194.2 lb

## 2022-01-24 DIAGNOSIS — R29898 Other symptoms and signs involving the musculoskeletal system: Secondary | ICD-10-CM

## 2022-01-24 NOTE — Progress Notes (Signed)
Subjective:  Patient ID: [name], female    DOB: 1945/08/25, 76 y.o.   MRN: [id number]  Patient Care Team: Janora Norlander, DO as PCP - General (Family Medicine) Debara Pickett Nadean Corwin, MD as PCP - Cardiology (Cardiology) Lavonna Monarch, MD (Inactive) as Consulting Physician (Dermatology) Luberta Mutter, MD as Consulting Physician (Ophthalmology) Ronald Lobo, MD as Consulting Physician (Gastroenterology) Debara Pickett Nadean Corwin, MD as Consulting Physician (Cardiology) Warren Danes, PA-C as Physician Assistant (Dermatology)   Chief Complaint:  Jaw Pain (Left side jaw pain after jaw got stuck x 2 weeks )   HPI: Carol [name] [name] is a 76 y.o. female presenting on 01/24/2022 for Jaw Pain (Left side jaw pain after jaw got stuck x 2 weeks )   Pt presents today for evaluation of her jaw. States she was yawning a few days ago and her jaw locked open, states after a few minutes it popped back in place and has not occurred again. She states her jaw was sore for a few days after. Denies trouble chewing, talking, or opening and closing mouth.     Relevant past medical, surgical, family, and social history reviewed and updated as indicated.  Allergies and medications reviewed and updated. Data reviewed: Chart in Epic.   Past Medical History:  Diagnosis Date   Carotid artery stenosis    Cataract    Diverticulosis    Fractured hand    right   Hyperlipidemia    Osteopenia    Precancerous skin lesion 2016   Squamous cell carcinoma of skin 12/02/2015   left forehead (CX35FU)   Squamous cell carcinoma of skin 12/02/2015   in situ- right cheek (CX35FU)``   Squamous cell carcinoma of skin 07/13/2017   in situ-left forehead/hairline   Squamous cell carcinoma of skin 11/13/2017   in situ- left forehead medial (TXPBX)    Past Surgical History:  Procedure Laterality Date   CATARACT EXTRACTION W/ INTRAOCULAR LENS IMPLANT Bilateral    CHOLECYSTECTOMY  2009   EYE SURGERY      hand fracture surgery      Social History   Socioeconomic History   Marital status: Widowed    Spouse name: Not on file   Number of children: 2   Years of education: 12   Highest education level: High school graduate  Occupational History   Occupation: retired    Fish farm manager: [employer]  Tobacco Use   Smoking status: Never   Smokeless tobacco: Never  Vaping Use   Vaping Use: Never used  Substance and Sexual Activity   Alcohol use: No   Drug use: No   Sexual activity: Not Currently  Other Topics Concern   Not on file  Social History Narrative   Lives alone. Has a basement   One child in [place], one in [place] Strain: Low Risk  (12/07/2020)   Overall Financial Resource Strain (CARDIA)    Difficulty of Paying Living Expenses: Not hard at all  Food Insecurity: No Food Insecurity (12/07/2020)   Hunger Vital Sign    Worried About Running Out of Food in the Last Year: Never true    Ran Out of Food in the Last Year: Never true  Transportation Needs: No Transportation Needs (12/07/2020)   PRAPARE - Hydrologist (Medical): No    Lack of Transportation (Non-Medical): No  Physical Activity: Sufficiently Active (12/07/2020)   Exercise Vital Sign  Days of Exercise per Week: 5 days    Minutes of Exercise per Session: 30 min  Stress: No Stress Concern Present (12/07/2020)   [place]    Feeling of Stress : Not at all  Social Connections: Moderately Integrated (12/07/2020)   Social Connection and Isolation Panel [NHANES]    Frequency of Communication with Friends and Family: More than three times a week    Frequency of Social Gatherings with Friends and Family: More than three times a week    Attends Religious Services: More than 4 times per year    Active Member of [employer] or Organizations: Yes    Attends Theatre manager Meetings: More than 4 times per year    Marital Status: Widowed  Intimate Partner Violence: Not At Risk (12/07/2020)   Humiliation, Afraid, Rape, and Kick questionnaire    Fear of Current or Ex-Partner: No    Emotionally Abused: No    Physically Abused: No    Sexually Abused: No    Outpatient Encounter Medications as of 01/24/2022  Medication Sig   aspirin EC 81 MG tablet Take 81 mg by mouth daily.   Calcium 500-125 MG-UNIT TABS 1 tablet. 1 tablet daily.   Cholecalciferol (VITAMIN D3) 2000 units capsule 1 capsule.   fluticasone (FLONASE) 50 MCG/ACT nasal spray Place 2 sprays into both nostrils daily.   REPATHA SURECLICK 007 MG/ML SOAJ INJECT 1 DOSE SUBCUTANEOUSLY EVERY 14 DAYS   rosuvastatin (CRESTOR) 5 MG tablet TAKE 1/2 (ONE-HALF) TABLET BY MOUTH THREE TIMES A WEEK   phenazopyridine (PYRIDIUM) 95 MG tablet Take 1 tablet (95 mg total) by mouth 3 (three) times daily as needed for pain. (Patient not taking: Reported on 01/24/2022)   No facility-administered encounter medications on file as of 01/24/2022.    Allergies  Allergen Reactions   Penicillins Hives   Sulfa Antibiotics    Sulfamethoxazole Itching   Lipitor [Atorvastatin] Diarrhea    Review of Systems  Constitutional:  Negative for activity change, appetite change, chills, fatigue and fever.  HENT: Negative.    Eyes: Negative.   Respiratory:  Negative for cough, chest tightness and shortness of breath.   Cardiovascular:  Negative for chest pain, palpitations and leg swelling.  Gastrointestinal:  Negative for blood in stool, constipation, diarrhea, nausea and vomiting.  Endocrine: Negative.   Genitourinary:  Negative for dysuria, frequency and urgency.  Musculoskeletal:  Positive for arthralgias and myalgias.       Jaw  Skin: Negative.   Allergic/Immunologic: Negative.   Neurological:  Negative for dizziness and headaches.  Hematological: Negative.   Psychiatric/Behavioral:  Negative for confusion,  hallucinations, sleep disturbance and suicidal ideas.   All other systems reviewed and are negative.       Objective:  BP (!) 166/78   Pulse 63   Temp (!) 97 F (36.1 C) (Temporal)   Ht _0  (1.676 m)   Wt 194 lb 3.2 oz (88.1 kg)   SpO2 94%   BMI 31.34 kg/m    Wt Readings from Last 3 Encounters:  01/24/22 194 lb 3.2 oz (88.1 kg)  12/21/21 192 lb 12.8 oz (87.5 kg)  10/17/21 189 lb 3.2 oz (85.8 kg)    Physical Exam Vitals and nursing note reviewed.  Constitutional:      General: She is not in acute distress.    Appearance: Normal appearance. She is obese. She is not ill-appearing, toxic-appearing or diaphoretic.  HENT:     Head: Normocephalic  and atraumatic.     Jaw: There is normal jaw occlusion. No trismus, tenderness, swelling, pain on movement or malocclusion.     Nose: Nose normal.     Mouth/Throat:     Mouth: Mucous membranes are moist.  Eyes:     Pupils: Pupils are equal, round, and reactive to light.  Cardiovascular:     Rate and Rhythm: Normal rate and regular rhythm.     Heart sounds: Murmur heard.     Systolic murmur is present with a grade of 2/6.  Pulmonary:     Effort: Pulmonary effort is normal.     Breath sounds: Normal breath sounds.  Skin:    General: Skin is warm and dry.     Capillary Refill: Capillary refill takes less than 2 seconds.  Neurological:     General: No focal deficit present.     Mental Status: She is alert and oriented to person, place, and time.  Psychiatric:        Mood and Affect: Mood normal.        Behavior: Behavior normal. Behavior is cooperative.        Thought Content: Thought content normal.        Judgment: Judgment normal.     Results for orders placed or performed in visit on 12/21/21  Lipid panel  Result Value Ref Range   Cholesterol, Total 123 100 - 199 mg/dL   Triglycerides 247 (H) 0 - 149 mg/dL   HDL 50 >39 mg/dL   VLDL Cholesterol Cal 38 5 - 40 mg/dL   LDL Chol Calc (NIH) 35 0 - 99 mg/dL   Chol/HDL  Ratio 2.5 0.0 - 4.4 ratio  Basic Metabolic Panel  Result Value Ref Range   Glucose 99 70 - 99 mg/dL   BUN 15 8 - 27 mg/dL   Creatinine, Ser 1.08 (H) 0.57 - 1.00 mg/dL   eGFR 53 (L) >59 mL/min/1.73   BUN/Creatinine Ratio 14 12 - 28   Sodium 145 (H) 134 - 144 mmol/L   Potassium 5.0 3.5 - 5.2 mmol/L   Chloride 104 96 - 106 mmol/L   CO2 23 20 - 29 mmol/L   Calcium 10.0 8.7 - 10.3 mg/dL       Pertinent labs & imaging results that were available during my care of the patient were reviewed by me and considered in my medical decision making.  Assessment & Plan:  Gabryela was seen today for jaw pain.  Diagnoses and all orders for this visit:  Jaw clicking Resolved. Aware to report new or worsening symptoms. Jaw ROM exercises discussed.     Continue all other maintenance medications.  Follow up plan: Return for need to see PCP for HTN management .   Continue healthy lifestyle choices, including diet (rich in fruits, vegetables, and lean proteins, and low in salt and simple carbohydrates) and exercise (at least 30 minutes of moderate physical activity daily).  Educational handout given for jaw range of motion  The above assessment and management plan was discussed with the patient. The patient verbalized understanding of and has agreed to the management plan. Patient is aware to call the clinic if they develop any new symptoms or if symptoms persist or worsen. Patient is aware when to return to the clinic for a follow-up visit. Patient educated on when it is appropriate to go to the emergency department.   Monia Pouch, FNP-C [facility] [phone]

## 2022-01-25 DIAGNOSIS — M9903 Segmental and somatic dysfunction of lumbar region: Secondary | ICD-10-CM | POA: Diagnosis not present

## 2022-01-25 DIAGNOSIS — S338XXA Sprain of other parts of lumbar spine and pelvis, initial encounter: Secondary | ICD-10-CM | POA: Diagnosis not present

## 2022-02-10 ENCOUNTER — Other Ambulatory Visit (HOSPITAL_COMMUNITY): Payer: Self-pay | Admitting: Family Medicine

## 2022-02-10 DIAGNOSIS — Z1231 Encounter for screening mammogram for malignant neoplasm of breast: Secondary | ICD-10-CM

## 2022-02-27 ENCOUNTER — Other Ambulatory Visit: Payer: Self-pay | Admitting: Family Medicine

## 2022-03-01 DIAGNOSIS — L570 Actinic keratosis: Secondary | ICD-10-CM | POA: Diagnosis not present

## 2022-03-01 DIAGNOSIS — C44311 Basal cell carcinoma of skin of nose: Secondary | ICD-10-CM | POA: Diagnosis not present

## 2022-03-01 DIAGNOSIS — C44519 Basal cell carcinoma of skin of other part of trunk: Secondary | ICD-10-CM | POA: Diagnosis not present

## 2022-03-17 ENCOUNTER — Ambulatory Visit (HOSPITAL_COMMUNITY): Payer: Medicare Other

## 2022-03-28 DIAGNOSIS — K080 Exfoliation of teeth due to systemic causes: Secondary | ICD-10-CM | POA: Diagnosis not present

## 2022-03-31 ENCOUNTER — Ambulatory Visit (HOSPITAL_COMMUNITY)
Admission: RE | Admit: 2022-03-31 | Discharge: 2022-03-31 | Disposition: A | Discharge status: Home / Self Care | Payer: Medicare Other | Source: Ambulatory Visit | Attending: Family Medicine | Admitting: Family Medicine

## 2022-03-31 DIAGNOSIS — Z1231 Encounter for screening mammogram for malignant neoplasm of breast: Secondary | ICD-10-CM | POA: Diagnosis not present

## 2022-05-04 DIAGNOSIS — C44311 Basal cell carcinoma of skin of nose: Secondary | ICD-10-CM | POA: Diagnosis not present

## 2022-05-26 ENCOUNTER — Other Ambulatory Visit: Payer: Self-pay | Admitting: Family Medicine

## 2022-05-26 ENCOUNTER — Other Ambulatory Visit: Payer: Self-pay | Admitting: Internal Medicine

## 2022-05-26 DIAGNOSIS — I6523 Occlusion and stenosis of bilateral carotid arteries: Secondary | ICD-10-CM

## 2022-05-26 DIAGNOSIS — E785 Hyperlipidemia, unspecified: Secondary | ICD-10-CM

## 2022-07-03 DIAGNOSIS — K080 Exfoliation of teeth due to systemic causes: Secondary | ICD-10-CM | POA: Diagnosis not present

## 2022-07-27 DIAGNOSIS — K080 Exfoliation of teeth due to systemic causes: Secondary | ICD-10-CM | POA: Diagnosis not present

## 2022-08-02 ENCOUNTER — Ambulatory Visit (HOSPITAL_COMMUNITY)
Admission: RE | Admit: 2022-08-02 | Discharge: 2022-08-02 | Disposition: A | Discharge status: Home / Self Care | Payer: Medicare Other | Source: Ambulatory Visit | Attending: Cardiovascular Disease | Admitting: Cardiovascular Disease

## 2022-08-02 ENCOUNTER — Encounter (HOSPITAL_COMMUNITY): Payer: 59

## 2022-08-02 DIAGNOSIS — I6523 Occlusion and stenosis of bilateral carotid arteries: Secondary | ICD-10-CM | POA: Insufficient documentation

## 2022-08-03 ENCOUNTER — Telehealth: Payer: Self-pay

## 2022-08-03 NOTE — Telephone Encounter (Signed)
Spoke with pt. Pt was notified of carotid ultrasound results. Pt will keep f/u and continue current medications.

## 2022-08-13 NOTE — Progress Notes (Signed)
Cardiology Office Note:    Date:  08/21/2022   ID:  Carol [name], DOB [birth year], MRN [id number]  PCP:  Raliegh Ip, DO   [facility] Providers Cardiologist:  Chrystie Nose, MD Cardiology APP:  Marcelino Duster, PA { Referring MD: Raliegh Ip, DO   Chief Complaint  Patient presents with   Follow-up    HTN    History of Present Illness:    Carol [name] is a 77 y.o. female with a hx of mild to moderate bilateral carotid artery stenosis and HLD now on repatha. She has a family history of premature coronary artery disease. She has followed with Dr. Rennis Golden and was last seen 11/2018. She was doing well at that time. Lipids responded well to repatha. She has struggled with elevated triglycerides. I last saw her 02/2021 and she was otherwise doing well at that time.   She presents today for annual follow up. She re-injured her back and is trying to do exercises at home. One fleeting bout of chest discomfort lateral to left breast that lasted 20-30 sec, has not recurred. No other complaints. Sister will be 106 yo in Sept. 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 Stroke runs in her family, she is on ASA.   Hx of white coat hypertension. She states her BP at home runs 130-140s. She is very active.    Past Medical History:  Diagnosis Date   Carotid artery stenosis    Cataract    Diverticulosis    Fractured hand    right   Hyperlipidemia    Osteopenia    Precancerous skin lesion 2016   Squamous cell carcinoma of skin 12/02/2015   left forehead (CX35FU)   Squamous cell carcinoma of skin 12/02/2015   in situ- right cheek (CX35FU)``   Squamous cell carcinoma of skin 07/13/2017   in situ-left forehead/hairline   Squamous cell carcinoma of skin 11/13/2017   in situ- left forehead medial (TXPBX)    Past Surgical History:  Procedure Laterality Date   CATARACT EXTRACTION W/ INTRAOCULAR LENS IMPLANT Bilateral    CHOLECYSTECTOMY  2009   EYE SURGERY      hand fracture surgery      Current Medications: Current Meds  Medication Sig   aspirin EC 81 MG tablet Take 81 mg by mouth daily.   Calcium 500-125 MG-UNIT TABS 1 tablet. 1 tablet daily.   Cholecalciferol (VITAMIN D3) 2000 units capsule 1 capsule.   rosuvastatin (CRESTOR) 5 MG tablet TAKE 1/2 (ONE-HALF) TABLET BY MOUTH THREE TIMES A WEEK (NEEDS TO BE SEEN BEFORE NEXT REFILL)   [DISCONTINUED] Evolocumab (REPATHA SURECLICK) 140 MG/ML SOAJ INJECT 1 DOSE (140MG  TOTAL) SUBCUTANEOUSLY ONCE EVERY 14 DAYS     Allergies:   Penicillins, Sulfa antibiotics, Sulfamethoxazole, and Lipitor [atorvastatin]   Social History   Socioeconomic History   Marital status: Widowed    Spouse name: Not on file   Number of children: 2   Years of education: 12   Highest education level: High school graduate  Occupational History   Occupation: retired    Associate Professor: Nurse, [employer]  Tobacco Use   Smoking status: Never   Smokeless tobacco: Never  Vaping Use   Vaping Use: Never used  Substance and Sexual Activity   Alcohol use: No   Drug use: No   Sexual activity: Not Currently  Other Topics Concern   Not on file  Social History Narrative   Lives alone. Has a basement  One child in [place], one in [place]   Social Determinants of Health   Financial Resource Strain: Low Risk  (12/07/2020)   Overall Financial Resource Strain (CARDIA)    Difficulty of Paying Living Expenses: Not hard at all  Food Insecurity: No Food Insecurity (12/07/2020)   Hunger Vital Sign    Worried About Running Out of Food in the Last Year: Never true    Ran Out of Food in the Last Year: Never true  Transportation Needs: No Transportation Needs (12/07/2020)   PRAPARE - Administrator, Civil Service (Medical): No    Lack of Transportation (Non-Medical): No  Physical Activity: Sufficiently Active (12/07/2020)   Exercise Vital Sign    Days of Exercise per Week: 5 days    Minutes of Exercise per Session: 30  min  Stress: No Stress Concern Present (12/07/2020)   [employer] of Occupational Health - Occupational Stress Questionnaire    Feeling of Stress : Not at all  Social Connections: Moderately Integrated (12/07/2020)   Social Connection and Isolation Panel [NHANES]    Frequency of Communication with Friends and Family: More than three times a week    Frequency of Social Gatherings with Friends and Family: More than three times a week    Attends Religious Services: More than 4 times per year    Active Member of [employer] or Organizations: Yes    Attends Banker Meetings: More than 4 times per year    Marital Status: Widowed     Family History: The patient's family history includes Alzheimer's disease in her brother and sister; CAD in her brother; Cancer in her sister, sister, and sister; Diabetes in her sister; Heart disease in her brother, brother, father, and mother; Hyperlipidemia in her sister; Hypertension in her brother; Kidney disease in her brother and sister.  ROS:   Please see the history of present illness.     All other systems reviewed and are negative.  EKGs/Labs/Other Studies Reviewed:    The following studies were reviewed today:  Carotid artery [place] 07/2022: Summary:  Right Carotid: Velocities in the right ICA are consistent with a 40-59%                 stenosis.   Left Carotid: Velocities in the left ICA are consistent with a 1-39%  stenosis.   Vertebrals: Bilateral vertebral arteries demonstrate antegrade flow.  Subclavians: Right subclavian artery flow was disturbed. Normal flow               hemodynamics were seen in the left subclavian artery.    EKG:  EKG is ordered today  EKG Interpretation  Date/Time:  Monday August 21 2022 08:07:21 EDT Ventricular Rate:  56 PR Interval:  194 QRS Duration: 84 QT Interval:  438 QTC Calculation: 422 R Axis:   52 Text Interpretation: Sinus bradycardia When compared with ECG of 25-Oct-2007 16:53, No  significant change was found Confirmed by Micah Flesher ([id number]) on 08/21/2022 8:20:23 AM    Recent Labs: 10/17/2021: ALT 16; Hemoglobin 14.8; Platelets 191; TSH 2.430 12/21/2021: BUN 15; Creatinine, Ser 1.08; Potassium 5.0; Sodium 145  Recent Lipid Panel    Component Value Date/Time   CHOL 123 12/21/2021 1358   CHOL 151 10/22/2012 0833   TRIG 247 (H) 12/21/2021 1358   TRIG 260 (H) 12/21/2016 1618   TRIG 80 10/22/2012 0833   HDL 50 12/21/2021 1358   HDL 47 12/21/2016 1618   HDL 51 10/22/2012 0833   CHOLHDL  2.5 12/21/2021 1358   LDLCALC 35 12/21/2021 1358   LDLCALC 87 07/29/2013 0804   LDLCALC 84 10/22/2012 0833   LDLDIRECT 93 10/08/2014 0759     Risk Assessment/Calculations:      HYPERTENSION CONTROL Vitals:   08/21/22 0800 08/21/22 0854  BP: (!) 158/92 (!) 176/78    The patient's blood pressure is elevated above target today.  In order to address the patient's elevated BP: Blood pressure will be monitored at home to determine if medication changes need to be made.            Physical Exam:    VS:  BP (!) 176/78 (BP Location: Left Arm, Patient Position: Sitting, Cuff Size: Normal)   Pulse (!) 56   Ht 5\' 5"  (1.651 m)   Wt 192 lb 12.8 oz (87.5 kg)   SpO2 96%   BMI 32.08 kg/m     Wt Readings from Last 3 Encounters:  08/21/22 192 lb 12.8 oz (87.5 kg)  01/24/22 194 lb 3.2 oz (88.1 kg)  12/21/21 192 lb 12.8 oz (87.5 kg)     GEN:  Well nourished, well developed in no acute distress HEENT: Normal NECK: No JVD; No carotid bruits LYMPHATICS: No lymphadenopathy CARDIAC: RRR, soft murmur heard at right sternal boarder RESPIRATORY:  Clear to auscultation without rales, wheezing or rhonchi  ABDOMEN: Soft, non-tender, non-distended MUSCULOSKELETAL:  No edema; No deformity  SKIN: Warm and dry NEUROLOGIC:  Alert and oriented x 3 PSYCHIATRIC:  Normal affect   ASSESSMENT:    1. Hyperlipidemia LDL goal <70   2. Bilateral carotid artery stenosis   3. Cardiac murmur    4. Primary hypertension   5. White coat syndrome with diagnosis of hypertension    PLAN:    In order of problems listed above:  Cardiac murmur No syncope No SOB, left lower extremity swelling Will obtain echocardiogram   Hyperlipidemia - pt has done well on repatha + 5 mg crestor - recheck fasting labs 12/21/2021: Cholesterol, Total 123; HDL 50; LDL Chol Calc (NIH) 35; Triglycerides 247 - triglycerides elevated, never started on lovaza - will check labs today   Carotic artery stenosis - [place] 07/2022 stable disease - recheck in 1 year   Hypertension - has white coat hypertension - not on anti-hypertensives - recheck BP was elevated - will keep a BP log at home   Will check a battery of labs as she has not been seen by PCP recently.    Medication Adjustments/Labs and Tests Ordered: Current medicines are reviewed at length with the patient today.  Concerns regarding medicines are outlined above.  Orders Placed This Encounter  Procedures   Lipid Profile   CBC   Comprehensive metabolic panel   HgB A1c   TSH   EKG 12-Lead   ECHOCARDIOGRAM COMPLETE   VAS US CAROTID   Meds ordered this encounter  Medications   Evolocumab (REPATHA SURECLICK) 140 MG/ML SOAJ    Sig: Inject 140 mg into the skin every 14 (fourteen) days. INJECT 1 DOSE (140MG  TOTAL) SUBCUTANEOUSLY ONCE EVERY 14 DAYS    Dispense:  1 mL    Refill:  0    Patient Instructions  Medication Instructions:  The current medical regimen is effective;  continue present plan and medications as directed. Please refer to the Current Medication list given to you today.  *If you need a refill on your cardiac medications before your next appointment, please call your pharmacy*  Lab Work: LIPID,CMET,CBC,A1C AND TSH TODAY If you  have labs (blood work) drawn today and your tests are completely normal, you will receive your results only by:   MyChart Message (if you have MyChart) OR  A paper copy in the mail If you have  any lab test that is abnormal or we need to change your treatment, we will call you to review the results.  Testing/Procedures: Echocardiogram - Your physician has requested that you have an echocardiogram. Echocardiography is a painless test that uses sound waves to create images of your heart. It provides your doctor with information about the size and shape of your heart and how well your heart's chambers and valves are working. This procedure takes approximately one hour. There are no restrictions for this procedure.    Follow-Up: At [facility], you and your health needs are our priority.  As part of our continuing mission to provide you with exceptional heart care, we have created designated Provider Care Teams.  These Care Teams include your primary Cardiologist (physician) and Advanced Practice Providers (APPs -  Physician Assistants and Nurse Practitioners) who all work together to provide you with the care you need, when you need it.  Your next appointment:   12 month(s)  Provider:   Chrystie Nose, MD     Other Instructions CALL IF YOU NEED SAMPLES    Signed, Marcelino Duster, PA  08/21/2022 9:33 AM    [facility]

## 2022-08-21 ENCOUNTER — Encounter: Payer: Self-pay | Admitting: Physician Assistant

## 2022-08-21 ENCOUNTER — Ambulatory Visit: Payer: Medicare Other | Attending: Physician Assistant | Admitting: Physician Assistant

## 2022-08-21 VITALS — BP 176/78 | HR 56 | Ht 65.0 in | Wt 192.8 lb

## 2022-08-21 DIAGNOSIS — I10 Essential (primary) hypertension: Secondary | ICD-10-CM

## 2022-08-21 DIAGNOSIS — I6523 Occlusion and stenosis of bilateral carotid arteries: Secondary | ICD-10-CM | POA: Diagnosis not present

## 2022-08-21 DIAGNOSIS — E785 Hyperlipidemia, unspecified: Secondary | ICD-10-CM

## 2022-08-21 DIAGNOSIS — R011 Cardiac murmur, unspecified: Secondary | ICD-10-CM | POA: Diagnosis not present

## 2022-08-21 MED ORDER — REPATHA SURECLICK 140 MG/ML ~~LOC~~ SOAJ: 1.0000 | SUBCUTANEOUS | 0 refills | Status: DC

## 2022-08-21 NOTE — Patient Instructions (Signed)
Medication Instructions:  The current medical regimen is effective;  continue present plan and medications as directed. Please refer to the Current Medication list given to you today.  *If you need a refill on your cardiac medications before your next appointment, please call your pharmacy*  Lab Work: LIPID,CMET,CBC,A1C AND TSH TODAY If you have labs (blood work) drawn today and your tests are completely normal, you will receive your results only by:   MyChart Message (if you have MyChart) OR  A paper copy in the mail If you have any lab test that is abnormal or we need to change your treatment, we will call you to review the results.  Testing/Procedures: Echocardiogram - Your physician has requested that you have an echocardiogram. Echocardiography is a painless test that uses sound waves to create images of your heart. It provides your doctor with information about the size and shape of your heart and how well your heart's chambers and valves are working. This procedure takes approximately one hour. There are no restrictions for this procedure.    Follow-Up: At [facility], you and your health needs are our priority.  As part of our continuing mission to provide you with exceptional heart care, we have created designated Provider Care Teams.  These Care Teams include your primary Cardiologist (physician) and Advanced Practice Providers (APPs -  Physician Assistants and Nurse Practitioners) who all work together to provide you with the care you need, when you need it.  Your next appointment:   12 month(s)  Provider:   Chrystie Nose, MD     Other Instructions CALL IF YOU NEED SAMPLES

## 2022-08-22 LAB — COMPREHENSIVE METABOLIC PANEL
ALT: 15 IU/L (ref 0–32)
AST: 16 IU/L (ref 0–40)
Albumin: 4.4 g/dL (ref 3.8–4.8)
Alkaline Phosphatase: 80 IU/L (ref 44–121)
BUN/Creatinine Ratio: 15 (ref 12–28)
BUN: 16 mg/dL (ref 8–27)
Bilirubin Total: 0.6 mg/dL (ref 0.0–1.2)
CO2: 24 mmol/L (ref 20–29)
Calcium: 9.5 mg/dL (ref 8.7–10.3)
Chloride: 107 mmol/L — ABNORMAL HIGH (ref 96–106)
Creatinine, Ser: 1.05 mg/dL — ABNORMAL HIGH (ref 0.57–1.00)
Globulin, Total: 2.7 g/dL (ref 1.5–4.5)
Glucose: 101 mg/dL — ABNORMAL HIGH (ref 70–99)
Potassium: 5 mmol/L (ref 3.5–5.2)
Sodium: 145 mmol/L — ABNORMAL HIGH (ref 134–144)
Total Protein: 7.1 g/dL (ref 6.0–8.5)
eGFR: 54 mL/min/{1.73_m2} — ABNORMAL LOW (ref >59)

## 2022-08-22 LAB — CBC
Hematocrit: 43.5 % (ref 34.0–46.6)
Hemoglobin: 14.4 g/dL (ref 11.1–15.9)
MCH: 30.3 pg (ref 26.6–33.0)
MCHC: 33.1 g/dL (ref 31.5–35.7)
MCV: 91 fL (ref 79–97)
Platelets: 202 10*3/uL (ref 150–450)
RBC: 4.76 x10E6/uL (ref 3.77–5.28)
RDW: 13 % (ref 11.7–15.4)
WBC: 6.7 10*3/uL (ref 3.4–10.8)

## 2022-08-22 LAB — LIPID PANEL
Chol/HDL Ratio: 2.2 ratio (ref 0.0–4.4)
Cholesterol, Total: 113 mg/dL (ref 100–199)
HDL: 51 mg/dL (ref >39)
LDL Chol Calc (NIH): 35 mg/dL (ref 0–99)
Triglycerides: 165 mg/dL — ABNORMAL HIGH (ref 0–149)
VLDL Cholesterol Cal: 27 mg/dL (ref 5–40)

## 2022-08-22 LAB — TSH: TSH: 2.55 u[IU]/mL (ref 0.450–4.500)

## 2022-08-22 LAB — HEMOGLOBIN A1C
Est. average glucose Bld gHb Est-mCnc: 114 mg/dL
Hgb A1c MFr Bld: 5.6 % (ref 4.8–5.6)

## 2022-08-23 ENCOUNTER — Telehealth: Payer: Self-pay

## 2022-08-23 NOTE — Telephone Encounter (Signed)
Spoke with pt. Pt was notified of lab results. Pt will continue current medication and follow up as planned.   

## 2022-08-29 DIAGNOSIS — L820 Inflamed seborrheic keratosis: Secondary | ICD-10-CM | POA: Diagnosis not present

## 2022-08-29 DIAGNOSIS — Z85828 Personal history of other malignant neoplasm of skin: Secondary | ICD-10-CM | POA: Diagnosis not present

## 2022-08-29 DIAGNOSIS — L821 Other seborrheic keratosis: Secondary | ICD-10-CM | POA: Diagnosis not present

## 2022-08-29 DIAGNOSIS — L570 Actinic keratosis: Secondary | ICD-10-CM | POA: Diagnosis not present

## 2022-08-29 DIAGNOSIS — D225 Melanocytic nevi of trunk: Secondary | ICD-10-CM | POA: Diagnosis not present

## 2022-08-29 DIAGNOSIS — C44311 Basal cell carcinoma of skin of nose: Secondary | ICD-10-CM | POA: Diagnosis not present

## 2022-08-30 ENCOUNTER — Other Ambulatory Visit: Payer: 59

## 2022-09-01 ENCOUNTER — Other Ambulatory Visit: Payer: Self-pay | Admitting: Family Medicine

## 2022-09-05 ENCOUNTER — Telehealth: Payer: Self-pay | Admitting: Physician Assistant

## 2022-09-05 DIAGNOSIS — E785 Hyperlipidemia, unspecified: Secondary | ICD-10-CM

## 2022-09-05 DIAGNOSIS — Z79899 Other long term (current) drug therapy: Secondary | ICD-10-CM

## 2022-09-05 MED ORDER — ROSUVASTATIN CALCIUM 5 MG PO TABS
ORAL_TABLET | ORAL | 0 refills | Status: DC
Start: 2022-09-05 — End: 2022-11-15

## 2022-09-05 NOTE — Telephone Encounter (Signed)
  Pt c/o medication issue:  1. Name of Medication: rosuvastatin (CRESTOR) 5 MG tablet   2. How are you currently taking this medication (dosage and times per day)? TAKE 1/2 (ONE-HALF) TABLET BY MOUTH THREE TIMES A WEEK   3. Are you having a reaction (difficulty breathing--STAT)? No   4. What is your medication issue? The patient is calling to request a refill of her Crestor from [facility]. Her primary care physician will not fill the prescription until she has an appointment, which is scheduled for December. However, she is almost out of her medication.

## 2022-09-05 NOTE — Telephone Encounter (Signed)
Spoke to the pt, she is currently taken Crestor 5 mg  attempt to refill the medication however, was advised by the pharmacy she no refills remaining. Pt stated she did contact PCP office, has scheduled appointment with PCP in October,however, was advised by the receptionist the office will not refill the medication. Pt would like to know if Dr. Ladon Applebaum will refill the medication. Will forward to MD and nurse for advise.

## 2022-09-05 NOTE — Telephone Encounter (Signed)
Spoke to the patient, explained Dr. Ladon Applebaum advise:   Ok to fill 3 month supply, no refills - follow-up with PCP and [place].   Patient scheduled with APP on 11/29/22 @ 9:15, prescription sent to pharmacy on file, and pt voiced understanding.

## 2022-09-06 NOTE — Telephone Encounter (Signed)
Office visit in October 2024 with Bernadene Person, NP

## 2022-09-11 ENCOUNTER — Ambulatory Visit (HOSPITAL_COMMUNITY): Payer: Medicare Other | Attending: Physician Assistant

## 2022-09-11 DIAGNOSIS — R011 Cardiac murmur, unspecified: Secondary | ICD-10-CM | POA: Diagnosis not present

## 2022-09-11 LAB — ECHOCARDIOGRAM COMPLETE
Area-P 1/2: 3.13 cm2
S' Lateral: 2.5 cm

## 2022-09-14 ENCOUNTER — Telehealth: Payer: Self-pay

## 2022-09-14 NOTE — Telephone Encounter (Deleted)
-----   Message from Roe Rutherford Duke sent at 09/12/2022  7:53 PM EDT ----- Carol [name]  She does not need a follow up with Irving Burton just for crestor refill. I just saw her in June. Can you please make sure she has her crestor?  Unless I'm missing something....  Thanks Angie

## 2022-09-14 NOTE — Telephone Encounter (Signed)
Received message from [place] PA. Per Micah Flesher PA, pt doesn't need f/u with Bernadene Person NP and should be taking Crestor as prescribed. Left a detailed message for pt. Pt advised to call back if she doesn't have Crestor. Medication was sent in to pts pharmacy on 09/05/2022.

## 2022-09-15 ENCOUNTER — Telehealth: Payer: Self-pay

## 2022-09-15 NOTE — Telephone Encounter (Signed)
Pt was notified of echo results. Pt will continue current medication and f/u as planned.

## 2022-09-25 DIAGNOSIS — K080 Exfoliation of teeth due to systemic causes: Secondary | ICD-10-CM | POA: Diagnosis not present

## 2022-10-03 DIAGNOSIS — C44311 Basal cell carcinoma of skin of nose: Secondary | ICD-10-CM | POA: Diagnosis not present

## 2022-11-11 ENCOUNTER — Other Ambulatory Visit: Payer: Self-pay | Admitting: Internal Medicine

## 2022-11-11 DIAGNOSIS — E785 Hyperlipidemia, unspecified: Secondary | ICD-10-CM

## 2022-11-11 DIAGNOSIS — I6523 Occlusion and stenosis of bilateral carotid arteries: Secondary | ICD-10-CM

## 2022-11-15 ENCOUNTER — Other Ambulatory Visit: Payer: Self-pay | Admitting: Internal Medicine

## 2022-11-15 DIAGNOSIS — E785 Hyperlipidemia, unspecified: Secondary | ICD-10-CM

## 2022-11-15 DIAGNOSIS — Z79899 Other long term (current) drug therapy: Secondary | ICD-10-CM

## 2022-11-29 ENCOUNTER — Ambulatory Visit: Payer: 59 | Admitting: Nurse Practitioner

## 2022-12-13 ENCOUNTER — Ambulatory Visit: Payer: Medicare Other

## 2022-12-13 VITALS — Ht 66.0 in | Wt 190.0 lb

## 2022-12-13 DIAGNOSIS — Z0000 Encounter for general adult medical examination without abnormal findings: Secondary | ICD-10-CM

## 2022-12-13 NOTE — Patient Instructions (Signed)
Carol [name] , Thank you for taking time to come for your Medicare Wellness Visit. I appreciate your ongoing commitment to your health goals. Please review the following plan we discussed and let me know if I can assist you in the future.   Referrals/Orders/Follow-Ups/Clinician Recommendations: Aim for 30 minutes of exercise or brisk walking, 6-8 glasses of water, and 5 servings of fruits and vegetables each day.   This is a list of the screening recommended for you and due dates:  Health Maintenance  Topic Date Due   Zoster (Shingles) Vaccine (1 of 2) 09/25/1962   Pneumonia Vaccine (1 of 1 - PCV) Never done   DTaP/Tdap/Td vaccine (2 - Td or Tdap) 02/28/2016   COVID-19 Vaccine (3 - Moderna risk series) 06/06/2019   Colon Cancer Screening  12/08/2021   Flu Shot  09/28/2022   Mammogram  04/01/2023   Medicare Annual Wellness Visit  12/13/2023   DEXA scan (bone density measurement)  12/22/2023   Hepatitis C Screening  Completed   HPV Vaccine  Aged Out    Advanced directives: (Copy Requested) Please bring a copy of your health care power of attorney and living will to the office to be added to your chart at your convenience.  Next Medicare Annual Wellness Visit scheduled for next year: Yes  Insert Preventive Care attachment Insert FALL PREVENTION attachment if needed

## 2022-12-13 NOTE — Progress Notes (Signed)
Subjective:   Carol [name] is a 77 y.o. female who presents for Medicare Annual (Subsequent) preventive examination.  Visit Complete: Virtual I connected with  Karlyne Greenspan on 12/13/22 by a audio enabled telemedicine application and verified that I am speaking with the correct person using two identifiers.  Patient Location: Home  Provider Location: Home Office  I discussed the limitations of evaluation and management by telemedicine. The patient expressed understanding and agreed to proceed.  Vital Signs: Because this visit was a virtual/telehealth visit, some criteria may be missing or patient reported. Any vitals not documented were not able to be obtained and vitals that have been documented are patient reported.  Patient Medicare AWV questionnaire was completed by the patient on 12/13/2022; I have confirmed that all information answered by patient is correct and no changes since this date.  Cardiac Risk Factors include: advanced age (>61men, >79 women);dyslipidemia;hypertension     Objective:    Today's Vitals   12/13/22 0834  Weight: 190 lb (86.2 kg)  Height: 5\' 6"  (1.676 m)   Body mass index is 30.67 kg/m.     12/13/2022    8:39 AM 12/08/2021    8:58 AM 12/07/2020    8:33 AM 12/04/2019    8:43 AM 09/26/2019   12:01 PM 12/03/2018    8:42 AM 11/27/2017    9:07 AM  Advanced Directives  Does Patient Have a Medical Advance Directive? Yes Yes No Yes Yes Yes Yes  Type of Estate agent of [place];Living will Healthcare Power of [place];Living will  Healthcare Power of [place];Living will Healthcare Power of [place];Living will Living will;Healthcare Power of [employer] Power of [place];Living will  Does patient want to make changes to medical advance directive?  No - Guardian declined  No - Patient declined  No - Patient declined No - Patient declined  Copy of Healthcare Power of Attorney in Chart? No - copy requested No - copy  requested  No - copy requested  No - copy requested No - copy requested  Would patient like information on creating a medical advance directive?   No - Patient declined    No - Patient declined    Current Medications (verified) Outpatient Encounter Medications as of 12/13/2022  Medication Sig   aspirin EC 81 MG tablet Take 81 mg by mouth daily.   Calcium 500-125 MG-UNIT TABS 1 tablet. 1 tablet daily.   Cholecalciferol (VITAMIN D3) 2000 units capsule 1 capsule.   Evolocumab (REPATHA SURECLICK) 140 MG/ML SOAJ INJECT 1 DOSE SUBCUTANEOUSLY ONCE EVERY 14 DAYS   rosuvastatin (CRESTOR) 5 MG tablet TAKE 1/2 TABLET BY MOUTH THREE TIMES A WEEK   No facility-administered encounter medications on file as of 12/13/2022.    Allergies (verified) Penicillins, Sulfa antibiotics, Sulfamethoxazole, and Lipitor [atorvastatin]   History: Past Medical History:  Diagnosis Date   Carotid artery stenosis    Cataract    Diverticulosis    Fractured hand    right   Hyperlipidemia    Osteopenia    Precancerous skin lesion 2016   Squamous cell carcinoma of skin 12/02/2015   left forehead (CX35FU)   Squamous cell carcinoma of skin 12/02/2015   in situ- right cheek (CX35FU)``   Squamous cell carcinoma of skin 07/13/2017   in situ-left forehead/hairline   Squamous cell carcinoma of skin 11/13/2017   in situ- left forehead medial (TXPBX)   Past Surgical History:  Procedure Laterality Date   CATARACT EXTRACTION W/ INTRAOCULAR LENS IMPLANT Bilateral  CHOLECYSTECTOMY  2009   EYE SURGERY     hand fracture surgery     Family History  Problem Relation Age of Onset   Heart disease Mother    Heart disease Father    Cancer Sister        breast   Hyperlipidemia Sister    Diabetes Sister    Cancer Sister        bone   Alzheimer's disease Sister    Cancer Sister        breast   Kidney disease Sister    Heart disease Brother    CAD Brother    Heart disease Brother    Hypertension Brother     Alzheimer's disease Brother    Kidney disease Brother        2018 starting dialysis   Social History   Socioeconomic History   Marital status: Widowed    Spouse name: Not on file   Number of children: 2   Years of education: 12   Highest education level: High school graduate  Occupational History   Occupation: retired    Associate Professor: Nurse, adult [employer]  Tobacco Use   Smoking status: Never   Smokeless tobacco: Never  Vaping Use   Vaping status: Never Used  Substance and Sexual Activity   Alcohol use: No   Drug use: No   Sexual activity: Not Currently  Other Topics Concern   Not on file  Social History Narrative   Lives alone. Has a basement   One child in [place], one in [place]   Social Determinants of Health   Financial Resource Strain: Low Risk  (12/13/2022)   Overall Financial Resource Strain (CARDIA)    Difficulty of Paying Living Expenses: Not hard at all  Food Insecurity: No Food Insecurity (12/13/2022)   Hunger Vital Sign    Worried About Running Out of Food in the Last Year: Never true    Ran Out of Food in the Last Year: Never true  Transportation Needs: No Transportation Needs (12/13/2022)   PRAPARE - Administrator, Civil Service (Medical): No    Lack of Transportation (Non-Medical): No  Physical Activity: Insufficiently Active (12/13/2022)   Exercise Vital Sign    Days of Exercise per Week: 3 days    Minutes of Exercise per Session: 30 min  Stress: No Stress Concern Present (12/13/2022)   [employer] of Occupational Health - Occupational Stress Questionnaire    Feeling of Stress : Not at all  Social Connections: Moderately Isolated (12/13/2022)   Social Connection and Isolation Panel [NHANES]    Frequency of Communication with Friends and Family: More than three times a week    Frequency of Social Gatherings with Friends and Family: More than three times a week    Attends Religious Services: More than 4 times per year    Active Member  of [employer] or Organizations: No    Attends Banker Meetings: Never    Marital Status: Widowed    Tobacco Counseling Counseling given: Not Answered   Clinical Intake:  Pre-visit preparation completed: Yes  Pain : No/denies pain     Nutritional Risks: None Diabetes: No  How often do you need to have someone help you when you read instructions, pamphlets, or other written materials from your doctor or pharmacy?: 1 - Never  Interpreter Needed?: No  Information entered by :: Renie Ora, LPN   Activities of Daily Living    12/13/2022    8:39 AM  In your present state of health, do you have any difficulty performing the following activities:  Hearing? 0  Vision? 0  Difficulty concentrating or making decisions? 0  Walking or climbing stairs? 0  Dressing or bathing? 0  Doing errands, shopping? 0  Preparing Food and eating ? N  Using the Toilet? N  In the past six months, have you accidently leaked urine? N  Do you have problems with loss of bowel control? N  Managing your Medications? N  Managing your Finances? N  Housekeeping or managing your Housekeeping? N    Patient Care Team: Raliegh Ip, DO as PCP - General (Family Medicine) Rennis Golden Lisette Abu, MD as PCP - Cardiology (Cardiology) Janalyn Harder, MD (Inactive) as Consulting Physician (Dermatology) Maris Berger, MD as Consulting Physician (Ophthalmology) Bernette Redbird, MD as Consulting Physician (Gastroenterology) Rennis Golden Lisette Abu, MD as Consulting Physician (Cardiology) Glyn Ade, PA-C as Physician Assistant (Dermatology) Duke, Roe Rutherford, [place] as Physician Assistant (Cardiology)  Indicate any recent Medical Services you may have received from other than [facility] providers in the past year (date may be approximate).     Assessment:   This is a routine wellness examination for Karalyn.  Hearing/Vision screen Vision Screening - Comments:: Wears rx glasses - up to date with  routine eye exams with  Dr.Mclary   Goals Addressed             This Visit's Progress    DIET - INCREASE WATER INTAKE   On track    Try to drink 6-8 glasses of water daily.       Depression Screen    12/13/2022    8:37 AM 01/24/2022    8:56 AM 12/21/2021   12:59 PM 12/08/2021    8:59 AM 10/17/2021    9:14 AM 05/31/2021    8:24 AM 04/25/2021   12:46 PM  PHQ 2/9 Scores  PHQ - 2 Score 0 0 0 0 0 0 0  PHQ- 9 Score   0   0 0    Fall Risk    12/13/2022    8:35 AM 01/24/2022    8:56 AM 12/08/2021    8:59 AM 10/17/2021    9:33 AM 10/17/2021    9:14 AM  Fall Risk   Falls in the past year? 0 0 0 0 0  Number falls in past yr: 0      Injury with Fall? 0      Risk for fall due to : No Fall Risks      Follow up Falls prevention discussed        MEDICARE RISK AT HOME: Medicare Risk at Home Any stairs in or around the home?: Yes If so, are there any without handrails?: No Home free of loose throw rugs in walkways, pet beds, electrical cords, etc?: Yes Adequate lighting in your home to reduce risk of falls?: Yes Life alert?: No Use of a cane, walker or w/c?: No Grab bars in the bathroom?: Yes Shower chair or bench in shower?: Yes Elevated toilet seat or a handicapped toilet?: Yes  TIMED UP AND GO:  Was the test performed?  No    Cognitive Function:    11/27/2017   12:04 PM 02/03/2016    9:10 AM 02/03/2016    9:08 AM 10/07/2014    9:32 AM  MMSE - Mini Mental State Exam  Orientation to time 5 5 5 5   Orientation to Place 5 5 5 5   Registration 3 3  3  Attention/ Calculation 5 5 5 5   Recall 3 2  3   Language- name 2 objects 2 2  2   Language- repeat 1 1  1   Language- follow 3 step command 3 3  3   Language- read & follow direction 1 1  1   Write a sentence 1 1  1   Copy design 1 1  1   Total score 30 29  30         12/13/2022    8:39 AM 12/08/2021    9:03 AM 12/04/2019    8:38 AM 12/03/2018    8:45 AM  6CIT Screen  What Year? 0 points 0 points 0 points 0 points  What  month? 0 points 0 points 0 points 0 points  What time? 0 points 0 points 0 points 0 points  Count back from 20 0 points 0 points 0 points 0 points  Months in reverse 0 points 0 points 0 points 0 points  Repeat phrase 0 points 0 points 0 points 0 points  Total Score 0 points 0 points 0 points 0 points    Immunizations Immunization History  Administered Date(s) Administered   Influenza,inj,Quad PF,6+ Mos 11/26/2012, 11/26/2013, 12/04/2014   Moderna Sars-Covid-2 Vaccination 04/10/2019, 05/09/2019   Tdap 02/27/2006   Zoster, Live 12/19/2013    TDAP status: Due, Education has been provided regarding the importance of this vaccine. Advised may receive this vaccine at local pharmacy or Health Dept. Aware to provide a copy of the vaccination record if obtained from local pharmacy or Health Dept. Verbalized acceptance and understanding.  Flu Vaccine status: Declined, Education has been provided regarding the importance of this vaccine but patient still declined. Advised may receive this vaccine at local pharmacy or Health Dept. Aware to provide a copy of the vaccination record if obtained from local pharmacy or Health Dept. Verbalized acceptance and understanding.  Pneumococcal vaccine status: Declined,  Education has been provided regarding the importance of this vaccine but patient still declined. Advised may receive this vaccine at local pharmacy or Health Dept. Aware to provide a copy of the vaccination record if obtained from local pharmacy or Health Dept. Verbalized acceptance and understanding.   Covid-19 vaccine status: Completed vaccines  Qualifies for Shingles Vaccine? Yes   Zostavax completed No   Shingrix Completed?: No.    Education has been provided regarding the importance of this vaccine. Patient has been advised to call insurance company to determine out of pocket expense if they have not yet received this vaccine. Advised may also receive vaccine at local pharmacy or Health Dept.  Verbalized acceptance and understanding.  Screening Tests Health Maintenance  Topic Date Due   Zoster Vaccines- Shingrix (1 of 2) 09/25/1962   Pneumonia Vaccine 33+ Years old (1 of 1 - PCV) Never done   DTaP/Tdap/Td (2 - Td or Tdap) 02/28/2016   COVID-19 Vaccine (3 - Moderna risk series) 06/06/2019   Colonoscopy  12/08/2021   INFLUENZA VACCINE  09/28/2022   MAMMOGRAM  04/01/2023   Medicare Annual Wellness (AWV)  12/13/2023   DEXA SCAN  12/22/2023   Hepatitis C Screening  Completed   HPV VACCINES  Aged Out    Health Maintenance  Health Maintenance Due  Topic Date Due   Zoster Vaccines- Shingrix (1 of 2) 09/25/1962   Pneumonia Vaccine 43+ Years old (1 of 1 - PCV) Never done   DTaP/Tdap/Td (2 - Td or Tdap) 02/28/2016   COVID-19 Vaccine (3 - Moderna risk series) 06/06/2019   Colonoscopy  12/08/2021  INFLUENZA VACCINE  09/28/2022    Colorectal cancer screening: No longer required.   Mammogram status: No longer required due to age.  Bone Density status: Completed 12/21/2021. Results reflect: Bone density results: OSTEOPOROSIS. Repeat every 2 years.  Lung Cancer Screening: (Low Dose CT Chest recommended if Age 38-80 years, 20 pack-year currently smoking OR have quit w/in 15years.) does not qualify.   Lung Cancer Screening Referral: n/a  Additional Screening:  Hepatitis C Screening: does not qualify; Completed 10/08/2014  Vision Screening: Recommended annual ophthalmology exams for early detection of glaucoma and other disorders of the eye. Is the patient up to date with their annual eye exam?  Yes  Who is the provider or what is the name of the office in which the patient attends annual eye exams? Dr.McClary  If pt is not established with a provider, would they like to be referred to a provider to establish care? No .   Dental Screening: Recommended annual dental exams for proper oral hygiene   Community Resource Referral / Chronic Care Management: CRR required this  visit?  No   CCM required this visit?  No     Plan:     I have personally reviewed and noted the following in the patient's chart:   Medical and social history Use of alcohol, tobacco or illicit drugs  Current medications and supplements including opioid prescriptions. Patient is not currently taking opioid prescriptions. Functional ability and status Nutritional status Physical activity Advanced directives List of other physicians Hospitalizations, surgeries, and ER visits in previous 12 months Vitals Screenings to include cognitive, depression, and falls Referrals and appointments  In addition, I have reviewed and discussed with patient certain preventive protocols, quality metrics, and best practice recommendations. A written personalized care plan for preventive services as well as general preventive health recommendations were provided to patient.     Lorrene Reid, LPN   [id number]/8117   After Visit Summary: (MyChart) Due to this being a telephonic visit, the after visit summary with patients personalized plan was offered to patient via MyChart   Nurse Notes: none

## 2022-12-20 ENCOUNTER — Emergency Department (HOSPITAL_COMMUNITY)
Admission: EM | Admit: 2022-12-20 | Discharge: 2022-12-20 | Disposition: A | Discharge status: Home / Self Care | Payer: Medicare Other | Attending: Emergency Medicine | Admitting: Emergency Medicine

## 2022-12-20 ENCOUNTER — Ambulatory Visit
Admission: EM | Admit: 2022-12-20 | Discharge: 2022-12-20 | Disposition: A | Discharge status: Home / Self Care | Payer: Medicare Other

## 2022-12-20 ENCOUNTER — Encounter (HOSPITAL_COMMUNITY): Payer: Self-pay | Admitting: Radiology

## 2022-12-20 ENCOUNTER — Other Ambulatory Visit: Payer: Self-pay

## 2022-12-20 ENCOUNTER — Emergency Department (HOSPITAL_COMMUNITY): Payer: Medicare Other

## 2022-12-20 DIAGNOSIS — W07XXXA Fall from chair, initial encounter: Secondary | ICD-10-CM | POA: Insufficient documentation

## 2022-12-20 DIAGNOSIS — S0990XA Unspecified injury of head, initial encounter: Secondary | ICD-10-CM

## 2022-12-20 DIAGNOSIS — Z7982 Long term (current) use of aspirin: Secondary | ICD-10-CM | POA: Insufficient documentation

## 2022-12-20 DIAGNOSIS — I672 Cerebral atherosclerosis: Secondary | ICD-10-CM | POA: Diagnosis not present

## 2022-12-20 DIAGNOSIS — I6782 Cerebral ischemia: Secondary | ICD-10-CM | POA: Diagnosis not present

## 2022-12-20 DIAGNOSIS — R519 Headache, unspecified: Secondary | ICD-10-CM | POA: Insufficient documentation

## 2022-12-20 NOTE — ED Notes (Signed)
Patient is being discharged from the Urgent Care and sent to the Emergency Department via POV . Per Roosvelt Maser PA, patient is in need of higher level of care due to Head injury after fall. Patient is aware and verbalizes understanding of plan of care.  Vitals:   12/20/22 1750  BP: (!) 179/76  Pulse: (!) 58  Resp: 17  Temp: 98.1 F (36.7 C)  SpO2: 97%

## 2022-12-20 NOTE — ED Triage Notes (Signed)
Pt c/o fall, with head injury, sat down in a each chair, moved the chair broke and she fell hitting the back of her head of an old stereo, experiencing lightheadedness, and swelling at the sight of the injury, pt states she did not black out.

## 2022-12-20 NOTE — ED Triage Notes (Signed)
Pt states she sat down in a plastic chair and it slide from under her and hit her head on a knob . Pt states she was foggy headed for a few minutes which has now cleared up. Pt with a bump to the back of her head. Pt denies use of blood thinners and and denies LOC.

## 2022-12-20 NOTE — ED Notes (Signed)
Patient ambulatory to room with steady gait noted

## 2022-12-20 NOTE — Discharge Instructions (Signed)
You have suffered a minor head injury.  Your CT scan was reassuring this evening.  You may have a minor concussion.  This can cause headaches last from days to weeks.  I recommend that you take Tylenol as directed if needed for pain.  Return to the ER if you develop any worsening symptoms such as persistent vomiting, confusion, sudden severe headache, off-balance or sudden vision changes.  Otherwise follow-up with your primary care provider in 1 week for recheck.

## 2022-12-21 NOTE — ED Provider Notes (Signed)
[place] [facility] AT [facility] Provider Note   CSN: [id number] Arrival date & time: 12/20/22  1814     History  Chief Complaint  Patient presents with   Marletta Lor    AMARE DINALLO is a 77 y.o. female.   Fall Associated symptoms include headaches. Pertinent negatives include no chest pain, no abdominal pain and no shortness of breath.        AAMIA UPTEGROVE is a 77 y.o. female who presents to the Emergency Department complaining of pain to the back of her head after direct blow.  States that she sat down in a plastic chair that broke fell back and struck her head on a metal stereo knob.  Initially, states that she was "foggy headed" for few minutes and had mild headache.  No longer feels foggy.  Has tender area to the back of her head.  She denies any blood thinners or loss of consciousness.  No nausea or vomiting.   Home Medications Prior to Admission medications   Medication Sig Start Date End Date Taking? Authorizing Provider  aspirin EC 81 MG tablet Take 81 mg by mouth daily.    [provider]  Calcium 500-125 MG-UNIT TABS 1 tablet. 1 tablet daily.    [provider]  Cholecalciferol (VITAMIN D3) 2000 units capsule 1 capsule.    [provider]  Evolocumab (REPATHA SURECLICK) 140 MG/ML SOAJ INJECT 1 DOSE SUBCUTANEOUSLY ONCE EVERY 14 DAYS 11/13/22   Hilty, Lisette Abu, MD  rosuvastatin (CRESTOR) 5 MG tablet TAKE 1/2 TABLET BY MOUTH THREE TIMES A WEEK 11/15/22   Hilty, Lisette Abu, MD      Allergies    Penicillins, Sulfa antibiotics, Sulfamethoxazole, and Lipitor [atorvastatin]    Review of Systems   Review of Systems  Constitutional:  Negative for chills and fever.  Eyes:  Negative for visual disturbance.  Respiratory:  Negative for shortness of breath.   Cardiovascular:  Negative for chest pain.  Gastrointestinal:  Negative for abdominal pain, nausea and vomiting.  Musculoskeletal:  Negative for back pain, neck pain and  neck stiffness.  Skin:  Negative for wound.  Neurological:  Positive for headaches. Negative for dizziness, syncope, weakness and numbness.  Psychiatric/Behavioral:  Positive for confusion.     Physical Exam Updated Vital Signs BP (!) 182/65   Pulse 60   Temp 98 F (36.7 C)   Resp 18   Ht 5\' 6"  (1.676 m)   Wt 86.2 kg   SpO2 97%   BMI 30.67 kg/m  Physical Exam Vitals and nursing note reviewed.  Constitutional:      General: She is not in acute distress.    Appearance: Normal appearance. She is not ill-appearing or toxic-appearing.  HENT:     Head:     Comments: Focal area of tenderness to the occipital scalp.  No abrasion laceration or hematoma. Eyes:     Extraocular Movements: Extraocular movements intact.     Conjunctiva/sclera: Conjunctivae normal.     Pupils: Pupils are equal, round, and reactive to light.  Cardiovascular:     Rate and Rhythm: Normal rate and regular rhythm.     Pulses: Normal pulses.  Pulmonary:     Effort: Pulmonary effort is normal.  Abdominal:     Palpations: Abdomen is soft.     Tenderness: There is no abdominal tenderness.  Musculoskeletal:        General: Normal range of motion.     Cervical back: Full passive range  of motion without pain and normal range of motion. No tenderness.  Skin:    General: Skin is warm.     Capillary Refill: Capillary refill takes less than 2 seconds.  Neurological:     General: No focal deficit present.     Mental Status: She is alert.     Sensory: No sensory deficit.     Motor: No weakness.     ED Results / Procedures / Treatments   Labs (all labs ordered are listed, but only abnormal results are displayed) Labs Reviewed - No data to display  EKG None  Radiology CT Head Wo Contrast  Result Date: 12/20/2022 CLINICAL DATA:  Polytrauma, blunt Hit head. EXAM: CT HEAD WITHOUT CONTRAST TECHNIQUE: Contiguous axial images were obtained from the base of the skull through the vertex without intravenous  contrast. RADIATION DOSE REDUCTION: This exam was performed according to the departmental dose-optimization program which includes automated exposure control, adjustment of the mA and/or kV according to patient size and/or use of iterative reconstruction technique. COMPARISON:  Remote head CT 06/10/2010 FINDINGS: Brain: No intracranial hemorrhage, mass effect, or midline shift. Normal brain volume for age. No hydrocephalus. The basilar cisterns are patent. Minimal periventricular chronic small vessel ischemic change. No evidence of territorial infarct or acute ischemia. No extra-axial or intracranial fluid collection. Vascular: Atherosclerosis of skullbase vasculature without hyperdense vessel or abnormal calcification. Skull: No fracture or focal lesion. Sinuses/Orbits: No acute finding.  Bilateral cataract resection Other: No confluent scalp hematoma. IMPRESSION: 1. No acute intracranial abnormality. No skull fracture. 2. Minimal chronic small vessel ischemic change. Electronically Signed   By: Narda Rutherford M.D.   On: 12/20/2022 20:52    Procedures Procedures    Medications Ordered in ED Medications - No data to display  ED Course/ Medical Decision Making/ A&P                                 Medical Decision Making Patient here for evaluation of head injury.  Does not take blood thinners denies any loss of consciousness.  No neck pain dizziness or visual changes.  Had brief episode of feeling "foggy headed" that has since resolved.  Continues to have soreness to the back of her head and mild headache.  Given age, at risk for intracranial injury  I suspect mild head injury, possible concussion low clinical suspicion for subdural hematoma intracranial injury or skull fracture.  Amount and/or Complexity of Data Reviewed Radiology: ordered.    Details: CT head without acute intracranial injury. Discussion of management or test interpretation with external provider(s): Patient ambulated in the  department.  Steady gait.  Requesting to be discharged home.  Neurovascularly intact.  I do not appreciate any focal neurodeficits on exam.  Given concussion injury instructions, appears appropriate for discharge home and will follow-up with PCP.  Return precautions were also given           Final Clinical Impression(s) / ED Diagnoses Final diagnoses:  Minor head injury, initial encounter    Rx / DC Orders ED Discharge Orders     None         Pauline Aus, PA-C 12/21/22 1205    Franne Forts, DO 12/28/22 [phone]

## 2023-01-16 DIAGNOSIS — K080 Exfoliation of teeth due to systemic causes: Secondary | ICD-10-CM | POA: Diagnosis not present

## 2023-02-14 ENCOUNTER — Ambulatory Visit (INDEPENDENT_AMBULATORY_CARE_PROVIDER_SITE_OTHER): Payer: Medicare Other | Admitting: Family Medicine

## 2023-02-14 ENCOUNTER — Encounter: Payer: Self-pay | Admitting: Family Medicine

## 2023-02-14 VITALS — BP 139/74 | HR 70 | Temp 98.5°F | Ht 66.0 in | Wt 197.0 lb

## 2023-02-14 DIAGNOSIS — M24541 Contracture, right hand: Secondary | ICD-10-CM

## 2023-02-14 DIAGNOSIS — L570 Actinic keratosis: Secondary | ICD-10-CM

## 2023-02-14 DIAGNOSIS — M47816 Spondylosis without myelopathy or radiculopathy, lumbar region: Secondary | ICD-10-CM | POA: Diagnosis not present

## 2023-02-14 DIAGNOSIS — M8589 Other specified disorders of bone density and structure, multiple sites: Secondary | ICD-10-CM

## 2023-02-14 DIAGNOSIS — M7741 Metatarsalgia, right foot: Secondary | ICD-10-CM

## 2023-02-14 DIAGNOSIS — Z0000 Encounter for general adult medical examination without abnormal findings: Secondary | ICD-10-CM

## 2023-02-14 DIAGNOSIS — Z0001 Encounter for general adult medical examination with abnormal findings: Secondary | ICD-10-CM

## 2023-02-14 DIAGNOSIS — Z6829 Body mass index (BMI) 29.0-29.9, adult: Secondary | ICD-10-CM

## 2023-02-14 DIAGNOSIS — E785 Hyperlipidemia, unspecified: Secondary | ICD-10-CM

## 2023-02-14 DIAGNOSIS — I6523 Occlusion and stenosis of bilateral carotid arteries: Secondary | ICD-10-CM

## 2023-02-14 NOTE — Progress Notes (Signed)
Carol [name] is a 77 y.o. female presents to office today for annual physical exam examination.    Concerns today include: 1.  Trigger finger She reports that she has been having increasing trigger finger on the right pinky.  She is right-hand dominant.  Reports preceding injury that required intervention by Dr Rica Mast many years ago.  He has since retired but she thinks his son is continuing the clinic and she would like to be referred there.  She also suffers from chronic back pain that flares up every once in a while.  She uses Tylenol for this.  Notes that she had her right foot swelling after using some lawn equipment where it was leaned against her foot for a while.  That is since resolved  Occupation: retired,  Substance use: none There are no preventive care reminders to display for this patient.  Refills needed today: none  Immunization History  Administered Date(s) Administered   Influenza,inj,Quad PF,6+ Mos 11/26/2012, 11/26/2013, 12/04/2014   Moderna Sars-Covid-2 Vaccination 04/10/2019, 05/09/2019   Tdap 02/27/2006   Zoster, Live 12/19/2013   Past Medical History:  Diagnosis Date   Carotid artery stenosis    Cataract    Diverticulosis    Fractured hand    right   Hyperlipidemia    Osteopenia    Precancerous skin lesion 2016   Squamous cell carcinoma of skin 12/02/2015   left forehead (CX35FU)   Squamous cell carcinoma of skin 12/02/2015   in situ- right cheek (CX35FU)``   Squamous cell carcinoma of skin 07/13/2017   in situ-left forehead/hairline   Squamous cell carcinoma of skin 11/13/2017   in situ- left forehead medial (TXPBX)   Social History   Socioeconomic History   Marital status: Widowed    Spouse name: Not on file   Number of children: 2   Years of education: 12   Highest education level: High school graduate  Occupational History   Occupation: retired    Associate Professor: Nurse, adult [employer]  Tobacco Use   Smoking status: Never   Smokeless  tobacco: Never  Vaping Use   Vaping status: Never Used  Substance and Sexual Activity   Alcohol use: No   Drug use: No   Sexual activity: Not Currently  Other Topics Concern   Not on file  Social History Narrative   Lives alone.    Loves [employer]!   Granddaughter graduating from [employer].  She's very proud of her   One child in [place], one in [place]   Social Drivers of Health   Financial Resource Strain: Low Risk  (12/13/2022)   Overall Financial Resource Strain (CARDIA)    Difficulty of Paying Living Expenses: Not hard at all  Food Insecurity: No Food Insecurity (12/13/2022)   Hunger Vital Sign    Worried About Running Out of Food in the Last Year: Never true    Ran Out of Food in the Last Year: Never true  Transportation Needs: No Transportation Needs (12/13/2022)   PRAPARE - Administrator, Civil Service (Medical): No    Lack of Transportation (Non-Medical): No  Physical Activity: Insufficiently Active (12/13/2022)   Exercise Vital Sign    Days of Exercise per Week: 3 days    Minutes of Exercise per Session: 30 min  Stress: No Stress Concern Present (12/13/2022)   [employer] of Occupational Health - Occupational Stress Questionnaire    Feeling of Stress : Not at all  Social Connections: Moderately Isolated (12/13/2022)  Social Advertising account executive [NHANES]    Frequency of Communication with Friends and Family: More than three times a week    Frequency of Social Gatherings with Friends and Family: More than three times a week    Attends Religious Services: More than 4 times per year    Active Member of [employer] or Organizations: No    Attends Banker Meetings: Never    Marital Status: Widowed  Intimate Partner Violence: Not At Risk (12/13/2022)   Humiliation, Afraid, Rape, and Kick questionnaire    Fear of Current or Ex-Partner: No    Emotionally Abused: No    Physically Abused: No    Sexually Abused: No   Past Surgical  History:  Procedure Laterality Date   CATARACT EXTRACTION W/ INTRAOCULAR LENS IMPLANT Bilateral    CHOLECYSTECTOMY  2009   EYE SURGERY     hand fracture surgery     Family History  Problem Relation Age of Onset   Heart disease Mother    Heart disease Father    Cancer Sister        breast   Hyperlipidemia Sister    Diabetes Sister    Cancer Sister        bone   Alzheimer's disease Sister    Cancer Sister        breast   Kidney disease Sister    Heart disease Brother    CAD Brother    Heart disease Brother    Hypertension Brother    Alzheimer's disease Brother    Kidney disease Brother        2018 starting dialysis    Current Outpatient Medications:    aspirin EC 81 MG tablet, Take 81 mg by mouth daily., Disp: , Rfl:    Calcium 500-125 MG-UNIT TABS, 1 tablet. 1 tablet daily., Disp: , Rfl:    Cholecalciferol (VITAMIN D3) 2000 units capsule, 1 capsule., Disp: , Rfl:    Evolocumab (REPATHA SURECLICK) 140 MG/ML SOAJ, INJECT 1 DOSE SUBCUTANEOUSLY ONCE EVERY 14 DAYS, Disp: 6 mL, Rfl: 3   rosuvastatin (CRESTOR) 5 MG tablet, TAKE 1/2 TABLET BY MOUTH THREE TIMES A WEEK, Disp: 18 tablet, Rfl: 2  Allergies  Allergen Reactions   Penicillins Hives   Sulfa Antibiotics    Sulfamethoxazole Itching   Lipitor [Atorvastatin] Diarrhea     ROS: Review of Systems Pertinent items noted in HPI and remainder of comprehensive ROS otherwise negative.    Physical exam BP 139/74   Pulse 70   Temp 98.5 F (36.9 C)   Ht 5\' 6"  (1.676 m)   Wt 197 lb (89.4 kg)   SpO2 95%   BMI 31.80 kg/m  General appearance: alert, cooperative, appears stated age, and no distress Head: Normocephalic, without obvious abnormality, atraumatic Eyes: negative findings: lids and lashes normal, conjunctivae and sclerae normal, corneas clear, and pupils equal, round, reactive to light and accomodation Ears: normal TM's and external ear canals both ears Nose: Nares normal. Septum midline. Mucosa normal. No drainage  or sinus tenderness. Throat: lips, mucosa, and tongue normal; teeth and gums normal Neck: no adenopathy, supple, symmetrical, trachea midline, and thyroid not enlarged, symmetric, no tenderness/mass/nodules Back: symmetric, no curvature. ROM normal. No CVA tenderness. Lungs: clear to auscultation bilaterally Heart: regular rate and rhythm, S1, S2 normal, no murmur, click, rub or gallop Abdomen: soft, non-tender; bowel sounds normal; no masses,  no organomegaly Extremities:  Contracture of the fifth digit appreciated.  No nodularity in the palmar aspect of that  right hand .  Right foot without any significant swelling.  No tenderness palpation over the fifth base of the metatarsal on the right Pulses: 2+ and symmetric Skin: Skin color, texture, turgor normal.  A couple of actinic keratoses noted along the left neck present Lymph nodes: Cervical, supraclavicular, and axillary nodes normal. Neurologic: Grossly normal      02/14/2023    9:06 AM 12/13/2022    8:37 AM 01/24/2022    8:56 AM  Depression screen PHQ 2/9  Decreased Interest 0 0 0  Down, Depressed, Hopeless 0 0 0  PHQ - 2 Score 0 0 0      02/14/2023    9:06 AM 12/21/2021    1:00 PM 10/17/2021    9:14 AM 05/31/2021    8:25 AM  GAD 7 : Generalized Anxiety Score  Nervous, Anxious, on Edge 0 0 0 0  Control/stop worrying 0 0 0 0  Worry too much - different things  0 0 0  Trouble relaxing  0 0 0  Restless  0 0 0  Easily annoyed or irritable  0 0 0  Afraid - awful might happen  0 0 0  Total GAD 7 Score  0 0 0  Anxiety Difficulty  Not difficult at all Not difficult at all Not difficult at all     Assessment/ Plan: Karlyne Greenspan here for annual physical exam.   Annual physical exam  Contracture of finger joint, right - Plan: Ambulatory referral to Orthopedic Surgery  Metatarsalgia of right foot  Spondylosis of lumbar region without myelopathy or radiculopathy  Hyperlipidemia LDL goal <70  Bilateral carotid artery  stenosis  BMI 29.0-29.9,adult  Actinic keratoses  Referral to orthopedics for further evaluation what appears to be actually contracture of that right pinky finger.  Metatarsalgia of the right foot is improving.  Okay to continue conservative management as needed  Offered muscle relaxer for as needed use but she did not want to have a medicine like that on hand so we will continue using Tylenol and will contact me if needed for breakthrough flareups  I reviewed her labs from this June.  No need to repeat fasting labs.  Agree with continuing Repatha and statin due to high cardiovascular risk in the setting of CAD in the family and known carotid artery disease  Continue balanced lifestyle.  Offered cryoablation of the actinic keratosis.  She can schedule if desires going forward.  Continue to follow-up with dermatology as directed  Counseled on healthy lifestyle choices, including diet (rich in fruits, vegetables and lean meats and low in salt and simple carbohydrates) and exercise (at least 30 minutes of moderate physical activity daily).  Patient to follow up 1 yr  Ariza Evans M. Nadine Counts, DO

## 2023-03-05 ENCOUNTER — Telehealth: Payer: Self-pay | Admitting: Pharmacist

## 2023-03-05 ENCOUNTER — Telehealth: Payer: Self-pay | Admitting: Pharmacy Technician

## 2023-03-05 ENCOUNTER — Other Ambulatory Visit (HOSPITAL_COMMUNITY): Payer: Self-pay

## 2023-03-05 DIAGNOSIS — H524 Presbyopia: Secondary | ICD-10-CM | POA: Diagnosis not present

## 2023-03-05 NOTE — Telephone Encounter (Signed)
 Pharmacy Patient Advocate Encounter   Received notification from Pt Calls Messages that prior authorization for Repatha  SureClick 140MG /ML auto-injectors is required/requested.   Insurance verification completed.   The patient is insured through [facility] .   Per test claim: PA required; PA submitted to above mentioned insurance via CoverMyMeds Key/confirmation #/EOC [id number] Status is pending

## 2023-03-05 NOTE — Telephone Encounter (Signed)
 PA request has been Submitted. New Encounter created for follow up. For additional info see Pharmacy Prior Auth telephone encounter from 03/05/23.

## 2023-03-05 NOTE — Telephone Encounter (Signed)
 Pharmacy Patient Advocate Encounter  Received notification from [facility] that Prior Authorization for Repatha  SureClick 140MG /ML auto-injectors has been APPROVED from 03/05/23 to 03/04/24. Spoke to pharmacy to process.Copay is $420.00 for 1 month, it was $510.00 for 3 months per pharmacy.    PA #/Case ID/Reference #: [id number]

## 2023-03-05 NOTE — Telephone Encounter (Signed)
 Repatha PA renewal request sent to the pool

## 2023-03-13 ENCOUNTER — Other Ambulatory Visit (HOSPITAL_COMMUNITY): Payer: Self-pay | Admitting: Family Medicine

## 2023-03-13 DIAGNOSIS — Z1231 Encounter for screening mammogram for malignant neoplasm of breast: Secondary | ICD-10-CM

## 2023-04-02 DIAGNOSIS — M720 Palmar fascial fibromatosis [Dupuytren]: Secondary | ICD-10-CM | POA: Diagnosis not present

## 2023-04-02 DIAGNOSIS — G5601 Carpal tunnel syndrome, right upper limb: Secondary | ICD-10-CM | POA: Diagnosis not present

## 2023-04-02 DIAGNOSIS — M79644 Pain in right finger(s): Secondary | ICD-10-CM | POA: Diagnosis not present

## 2023-04-04 ENCOUNTER — Encounter (HOSPITAL_COMMUNITY): Payer: Self-pay

## 2023-04-04 ENCOUNTER — Ambulatory Visit (HOSPITAL_COMMUNITY)
Admission: RE | Admit: 2023-04-04 | Discharge: 2023-04-04 | Disposition: A | Discharge status: Home / Self Care | Payer: Medicare Other | Source: Ambulatory Visit | Attending: Family Medicine | Admitting: Family Medicine

## 2023-04-04 DIAGNOSIS — Z1231 Encounter for screening mammogram for malignant neoplasm of breast: Secondary | ICD-10-CM | POA: Insufficient documentation

## 2023-04-06 ENCOUNTER — Encounter: Payer: Self-pay | Admitting: Family Medicine

## 2023-04-25 DIAGNOSIS — G5601 Carpal tunnel syndrome, right upper limb: Secondary | ICD-10-CM | POA: Diagnosis not present

## 2023-05-01 DIAGNOSIS — G5601 Carpal tunnel syndrome, right upper limb: Secondary | ICD-10-CM | POA: Diagnosis not present

## 2023-05-02 ENCOUNTER — Other Ambulatory Visit: Payer: Self-pay | Admitting: Orthopedic Surgery

## 2023-05-02 ENCOUNTER — Telehealth: Payer: Self-pay | Admitting: Internal Medicine

## 2023-05-02 NOTE — Telephone Encounter (Signed)
   Name: Carol [name]  DOB: 1945/12/05  MRN: [id number]  Primary Cardiologist: Chrystie Nose, MD   Preoperative team, please contact this patient and set up a phone call appointment for further preoperative risk assessment. Please obtain consent and complete medication review. Thank you for your help.  I confirm that guidance regarding antiplatelet and oral anticoagulation therapy has been completed and, if necessary, noted below.  Per office protocol, if patient is without any new symptoms or concerns at the time of their virtual visit, she may hold Aspirin for 5-7 days prior to procedure. Please resume Aspirin as soon as possible postprocedure, at the discretion of the surgeon.    I also confirmed the patient resides in the state of [place]. As per [facility] Medical Board telemedicine laws, the patient must reside in the state in which the provider is licensed.   Denyce Robert, NP 05/02/2023, 3:20 PM [facility]

## 2023-05-02 NOTE — Telephone Encounter (Signed)
 Pt has been scheduled tele preop appt 05/10/23. Med rec and consent are done.      Patient Consent for Virtual Visit        Carol [name] has provided verbal consent on 05/02/2023 for a virtual visit (video or telephone).   CONSENT FOR VIRTUAL VISIT FOR:  Carol [name]  By participating in this virtual visit I agree to the following:  I hereby voluntarily request, consent and authorize [facility] and its employed or contracted physicians, physician assistants, nurse practitioners or other licensed health care professionals (the Practitioner), to provide me with telemedicine health care services (the "Services") as deemed necessary by the treating Practitioner. I acknowledge and consent to receive the Services by the Practitioner via telemedicine. I understand that the telemedicine visit will involve communicating with the Practitioner through live audiovisual communication technology and the disclosure of certain medical information by electronic transmission. I acknowledge that I have been given the opportunity to request an in-person assessment or other available alternative prior to the telemedicine visit and am voluntarily participating in the telemedicine visit.  I understand that I have the right to withhold or withdraw my consent to the use of telemedicine in the course of my care at any time, without affecting my right to future care or treatment, and that the Practitioner or I may terminate the telemedicine visit at any time. I understand that I have the right to inspect all information obtained and/or recorded in the course of the telemedicine visit and may receive copies of available information for a reasonable fee.  I understand that some of the potential risks of receiving the Services via telemedicine include:  Delay or interruption in medical evaluation due to technological equipment failure or disruption; Information transmitted may not be sufficient (e.g. poor  resolution of images) to allow for appropriate medical decision making by the Practitioner; and/or  In rare instances, security protocols could fail, causing a breach of personal health information.  Furthermore, I acknowledge that it is my responsibility to provide information about my medical history, conditions and care that is complete and accurate to the best of my ability. I acknowledge that Practitioner's advice, recommendations, and/or decision may be based on factors not within their control, such as incomplete or inaccurate data provided by me or distortions of diagnostic images or specimens that may result from electronic transmissions. I understand that the practice of medicine is not an exact science and that Practitioner makes no warranties or guarantees regarding treatment outcomes. I acknowledge that a copy of this consent can be made available to me via my patient portal [facility] MyChart), or I can request a printed copy by calling the office of [facility].    I understand that my insurance will be billed for this visit.   I have read or had this consent read to me. I understand the contents of this consent, which adequately explains the benefits and risks of the Services being provided via telemedicine.  I have been provided ample opportunity to ask questions regarding this consent and the Services and have had my questions answered to my satisfaction. I give my informed consent for the services to be provided through the use of telemedicine in my medical care

## 2023-05-02 NOTE — Telephone Encounter (Signed)
 Pt has been scheduled tele preop appt 05/10/23. Med rec and consent are done.

## 2023-05-02 NOTE — Telephone Encounter (Signed)
   Pre-operative Risk Assessment    Patient Name: Carol [name]  DOB: 1945/11/03 MRN: [id number]   Date of last office visit: 08/21/22 [place], [place]  Date of next office visit: nothing scheduled    Request for Surgical Clearance    Procedure:   Right Carpal Tunnel Release   Date of Surgery:  Clearance 05/18/23                                Surgeon:  Dr. Laban Emperor Group or Practice Name:  [facility]  Phone number:  [phone] Fax number:  [phone]    Type of Clearance Requested:   - Medical  - Pharmacy:  Hold asked for recommendations and for how long       Type of Anesthesia:   Choice    Additional requests/questions:    Alben Spittle   05/02/2023, 3:05 PM

## 2023-05-10 ENCOUNTER — Ambulatory Visit: Attending: Cardiology

## 2023-05-10 DIAGNOSIS — Z01810 Encounter for preprocedural cardiovascular examination: Secondary | ICD-10-CM | POA: Diagnosis not present

## 2023-05-10 NOTE — Progress Notes (Signed)
 Virtual Visit via Telephone Note   Because of Carol [name] co-morbid illnesses, she is at least at moderate risk for complications without adequate follow up.  This format is felt to be most appropriate for this patient at this time.  Due to technical limitations with video connection (technology), today's appointment will be conducted as an audio only telehealth visit, and Carol [name] verbally agreed to proceed in this manner.   All issues noted in this document were discussed and addressed.  No physical exam could be performed with this format.  Evaluation Performed:  Preoperative cardiovascular risk assessment _____________   Date:  05/10/2023   Patient ID:  Carol [name], DOB 1945-08-05, MRN [id number] Patient Location:  Home Provider location:   Office  Primary Care Provider:  Raliegh Ip, DO Primary Cardiologist:  Chrystie Nose, MD  Chief Complaint / Patient Profile   78 y.o. y/o female with a h/o carotid stenosis, hyperlipidemia, cardiac murmur who is pending right carpal tunnel release and presents today for telephonic preoperative cardiovascular risk assessment.  History of Present Illness    Carol [name] is a 78 y.o. female who presents via audio/video conferencing for a telehealth visit today.  Pt was last seen in cardiology clinic on 08/21/2022 by Bettina Gavia PA-C.  At that time Carol [name] was doing well .  The patient is now pending procedure as outlined above. Since her last visit, she continues to be stable from a cardiac standpoint.  Today she denies chest pain, shortness of breath, lower extremity edema, fatigue, palpitations, melena, hematuria, hemoptysis, diaphoresis, weakness, presyncope, syncope, orthopnea, and PND.   Past Medical History    Past Medical History:  Diagnosis Date   Carotid artery stenosis    Cataract    Diverticulosis    Fractured hand    right   Hyperlipidemia    Osteopenia    Precancerous skin lesion 2016    Squamous cell carcinoma of skin 12/02/2015   left forehead (CX35FU)   Squamous cell carcinoma of skin 12/02/2015   in situ- right cheek (CX35FU)``   Squamous cell carcinoma of skin 07/13/2017   in situ-left forehead/hairline   Squamous cell carcinoma of skin 11/13/2017   in situ- left forehead medial (TXPBX)   Past Surgical History:  Procedure Laterality Date   CATARACT EXTRACTION W/ INTRAOCULAR LENS IMPLANT Bilateral    CHOLECYSTECTOMY  2009   EYE SURGERY     hand fracture surgery      Allergies  Allergies  Allergen Reactions   Penicillins Hives   Sulfa Antibiotics    Sulfamethoxazole Itching   Lipitor [Atorvastatin] Diarrhea    Home Medications    Prior to Admission medications   Medication Sig Start Date End Date Taking? Authorizing Provider  aspirin EC 81 MG tablet Take 81 mg by mouth daily.    [provider]  Calcium 500-125 MG-UNIT TABS 1 tablet. 1 tablet daily.    [provider]  Cholecalciferol (VITAMIN D3) 2000 units capsule 1 capsule.    [provider]  Evolocumab (REPATHA SURECLICK) 140 MG/ML SOAJ INJECT 1 DOSE SUBCUTANEOUSLY ONCE EVERY 14 DAYS 11/13/22   Hilty, Lisette Abu, MD  rosuvastatin (CRESTOR) 5 MG tablet TAKE 1/2 TABLET BY MOUTH THREE TIMES A WEEK 11/15/22   Hilty, Lisette Abu, MD    Physical Exam    Vital Signs:  Carol [name] does not have vital signs available for review today.  Given telephonic nature of communication, physical exam  is limited. AAOx3. NAD. Normal affect.  Speech and respirations are unlabored.  Accessory Clinical Findings    None  Assessment & Plan    1.  Preoperative Cardiovascular Risk Assessment: Procedure:   Right Carpal Tunnel Release    Date of Surgery:  Clearance 05/18/23                                  Surgeon:  Dr. Laban Emperor Group or Practice Name:  [facility]  Phone number:  [phone] Fax number:  [phone]       Primary Cardiologist: Chrystie Nose,  MD  Chart reviewed as part of pre-operative protocol coverage. Given past medical history and time since last visit, based on ACC/AHA guidelines, Carol [name] would be at acceptable risk for the planned procedure without further cardiovascular testing.   Her RCRI is very low risk, 0.4% risk of major cardiac event.  Patient was advised that if she develops new symptoms prior to surgery to contact our office to arrange a follow-up appointment.  He verbalized understanding.  She may hold Aspirin for 5-7 days prior to procedure. Please resume Aspirin as soon as possible postprocedure, at the discretion of the surgeon.    I will route this recommendation to the requesting party via Epic fax function and remove from pre-op pool.       Time:   Today, I have spent 10 minutes with the patient with telehealth technology discussing medical history, symptoms, and management plan.     Ronney Asters, NP  05/10/2023, 7:45 AM

## 2023-05-11 ENCOUNTER — Encounter (HOSPITAL_BASED_OUTPATIENT_CLINIC_OR_DEPARTMENT_OTHER): Payer: Self-pay | Admitting: Orthopedic Surgery

## 2023-05-11 ENCOUNTER — Other Ambulatory Visit: Payer: Self-pay

## 2023-05-11 NOTE — Progress Notes (Signed)

## 2023-05-18 ENCOUNTER — Ambulatory Visit (HOSPITAL_BASED_OUTPATIENT_CLINIC_OR_DEPARTMENT_OTHER): Admitting: Anesthesiology

## 2023-05-18 ENCOUNTER — Encounter (HOSPITAL_BASED_OUTPATIENT_CLINIC_OR_DEPARTMENT_OTHER): Payer: Self-pay | Admitting: Orthopedic Surgery

## 2023-05-18 ENCOUNTER — Encounter (HOSPITAL_BASED_OUTPATIENT_CLINIC_OR_DEPARTMENT_OTHER)
Admission: RE | Disposition: A | Discharge status: Home / Self Care | Payer: Self-pay | Source: Ambulatory Visit | Attending: Orthopedic Surgery

## 2023-05-18 ENCOUNTER — Ambulatory Visit (HOSPITAL_BASED_OUTPATIENT_CLINIC_OR_DEPARTMENT_OTHER)
Admission: RE | Admit: 2023-05-18 | Discharge: 2023-05-18 | Disposition: A | Discharge status: Home / Self Care | Source: Ambulatory Visit | Attending: Orthopedic Surgery | Admitting: Orthopedic Surgery

## 2023-05-18 DIAGNOSIS — G5601 Carpal tunnel syndrome, right upper limb: Secondary | ICD-10-CM | POA: Diagnosis not present

## 2023-05-18 HISTORY — PX: CARPAL TUNNEL RELEASE: SHX101

## 2023-05-18 SURGERY — CARPAL TUNNEL RELEASE
Anesthesia: General | Site: Wrist | Laterality: Right

## 2023-05-18 MED ORDER — OXYCODONE HCL 5 MG PO TABS: 5.0000 mg | ORAL_TABLET | Freq: Once | ORAL | Status: DC | PRN

## 2023-05-18 MED ORDER — ACETAMINOPHEN 10 MG/ML IV SOLN: 1000.0000 mg | Freq: Once | INTRAVENOUS | Status: DC | PRN

## 2023-05-18 MED ORDER — DEXAMETHASONE SODIUM PHOSPHATE 10 MG/ML IJ SOLN
INTRAMUSCULAR | Status: DC | PRN
  Administered 2023-05-18: 5 mg via INTRAVENOUS

## 2023-05-18 MED ORDER — FENTANYL CITRATE (PF) 100 MCG/2ML IJ SOLN
INTRAMUSCULAR | Status: AC
  Filled 2023-05-18: qty 2

## 2023-05-18 MED ORDER — 0.9 % SODIUM CHLORIDE (POUR BTL) OPTIME
TOPICAL | Status: DC | PRN
  Administered 2023-05-18: 1000 mL

## 2023-05-18 MED ORDER — LIDOCAINE 2% (20 MG/ML) 5 ML SYRINGE
INTRAMUSCULAR | Status: DC | PRN
  Administered 2023-05-18: 40 mg via INTRAVENOUS

## 2023-05-18 MED ORDER — OXYCODONE HCL 5 MG/5ML PO SOLN: 5.0000 mg | Freq: Once | ORAL | Status: DC | PRN

## 2023-05-18 MED ORDER — HYDROCODONE-ACETAMINOPHEN 5-325 MG PO TABS: 1.0000 | ORAL_TABLET | Freq: Four times a day (QID) | ORAL | 0 refills | Status: DC | PRN

## 2023-05-18 MED ORDER — FENTANYL CITRATE (PF) 100 MCG/2ML IJ SOLN: 25.0000 ug | INTRAMUSCULAR | Status: DC | PRN

## 2023-05-18 MED ORDER — FENTANYL CITRATE (PF) 100 MCG/2ML IJ SOLN
INTRAMUSCULAR | Status: DC | PRN
  Administered 2023-05-18: 12.5 ug via INTRAVENOUS
  Administered 2023-05-18: 25 ug via INTRAVENOUS

## 2023-05-18 MED ORDER — PROPOFOL 10 MG/ML IV BOLUS
INTRAVENOUS | Status: DC | PRN
  Administered 2023-05-18: 140 mg via INTRAVENOUS

## 2023-05-18 MED ORDER — LIDOCAINE 2% (20 MG/ML) 5 ML SYRINGE
INTRAMUSCULAR | Status: AC
Start: 2023-05-18 — End: ?
  Filled 2023-05-18: qty 5

## 2023-05-18 MED ORDER — ACETAMINOPHEN 160 MG/5ML PO SOLN: 1000.0000 mg | Freq: Once | ORAL | Status: AC | PRN

## 2023-05-18 MED ORDER — DEXAMETHASONE SODIUM PHOSPHATE 10 MG/ML IJ SOLN
INTRAMUSCULAR | Status: AC
Start: 2023-05-18 — End: ?
  Filled 2023-05-18: qty 1

## 2023-05-18 MED ORDER — CEFAZOLIN SODIUM-DEXTROSE 2-4 GM/100ML-% IV SOLN
INTRAVENOUS | Status: AC
  Filled 2023-05-18: qty 100

## 2023-05-18 MED ORDER — LACTATED RINGERS IV SOLN: INTRAVENOUS | Status: DC

## 2023-05-18 MED ORDER — ONDANSETRON HCL 4 MG/2ML IJ SOLN
INTRAMUSCULAR | Status: AC
  Filled 2023-05-18: qty 2

## 2023-05-18 MED ORDER — ACETAMINOPHEN 500 MG PO TABS
ORAL_TABLET | ORAL | Status: AC
  Filled 2023-05-18: qty 2

## 2023-05-18 MED ORDER — CEFAZOLIN SODIUM-DEXTROSE 2-4 GM/100ML-% IV SOLN
2.0000 g | INTRAVENOUS | Status: AC
  Administered 2023-05-18: 2 g via INTRAVENOUS

## 2023-05-18 MED ORDER — PROPOFOL 10 MG/ML IV BOLUS
INTRAVENOUS | Status: AC
  Filled 2023-05-18: qty 20

## 2023-05-18 MED ORDER — BUPIVACAINE HCL (PF) 0.25 % IJ SOLN
INTRAMUSCULAR | Status: DC | PRN
  Administered 2023-05-18: 10 mL

## 2023-05-18 MED ORDER — ACETAMINOPHEN 500 MG PO TABS
1000.0000 mg | ORAL_TABLET | Freq: Once | ORAL | Status: AC | PRN
  Administered 2023-05-18: 1000 mg via ORAL

## 2023-05-18 SURGICAL SUPPLY — 29 items
BLADE SURG 15 STRL LF DISP TIS (BLADE) ×2 IMPLANT
BNDG ELASTIC 3INX 5YD STR LF (GAUZE/BANDAGES/DRESSINGS) ×1 IMPLANT
BNDG ESMARK 4X9 LF (GAUZE/BANDAGES/DRESSINGS) IMPLANT
BNDG GAUZE DERMACEA FLUFF 4 (GAUZE/BANDAGES/DRESSINGS) ×1 IMPLANT
CHLORAPREP W/TINT 26 (MISCELLANEOUS) ×1 IMPLANT
CORD BIPOLAR FORCEPS 12FT (ELECTRODE) ×1 IMPLANT
COVER BACK TABLE 60X90IN (DRAPES) ×1 IMPLANT
COVER MAYO STAND STRL (DRAPES) ×1 IMPLANT
CUFF TOURN SGL QUICK 18X4 (TOURNIQUET CUFF) ×1 IMPLANT
DRAPE EXTREMITY T 121X128X90 (DISPOSABLE) ×1 IMPLANT
DRAPE SURG 17X23 STRL (DRAPES) ×1 IMPLANT
GAUZE PAD ABD 8X10 STRL (GAUZE/BANDAGES/DRESSINGS) ×1 IMPLANT
GAUZE SPONGE 4X4 12PLY STRL (GAUZE/BANDAGES/DRESSINGS) ×1 IMPLANT
GAUZE XEROFORM 1X8 LF (GAUZE/BANDAGES/DRESSINGS) ×1 IMPLANT
GLOVE BIO SURGEON STRL SZ7.5 (GLOVE) ×1 IMPLANT
GLOVE BIOGEL PI IND STRL 8 (GLOVE) ×1 IMPLANT
GOWN STRL REUS W/ TWL LRG LVL3 (GOWN DISPOSABLE) ×1 IMPLANT
GOWN STRL REUS W/TWL XL LVL3 (GOWN DISPOSABLE) ×1 IMPLANT
NDL HYPO 25X1 1.5 SAFETY (NEEDLE) ×1 IMPLANT
NEEDLE HYPO 25X1 1.5 SAFETY (NEEDLE) ×1 IMPLANT
NS IRRIG 1000ML POUR BTL (IV SOLUTION) ×1 IMPLANT
PACK BASIN DAY SURGERY FS (CUSTOM PROCEDURE TRAY) ×1 IMPLANT
PADDING CAST ABS COTTON 4X4 ST (CAST SUPPLIES) ×1 IMPLANT
STOCKINETTE 4X48 STRL (DRAPES) ×1 IMPLANT
SUT ETHILON 4 0 PS 2 18 (SUTURE) ×1 IMPLANT
SYR BULB EAR ULCER 3OZ GRN STR (SYRINGE) ×1 IMPLANT
SYR CONTROL 10ML LL (SYRINGE) ×1 IMPLANT
TOWEL GREEN STERILE FF (TOWEL DISPOSABLE) ×2 IMPLANT
UNDERPAD 30X36 HEAVY ABSORB (UNDERPADS AND DIAPERS) ×1 IMPLANT

## 2023-05-18 NOTE — Anesthesia Procedure Notes (Signed)
 Procedure Name: LMA Insertion Date/Time: 05/18/2023 3:08 PM  Performed by: Francie Massing, CRNAPre-anesthesia Checklist: Patient identified, Emergency Drugs available, Suction available and Patient being monitored Patient Re-evaluated:Patient Re-evaluated prior to induction Oxygen Delivery Method: Circle system utilized Preoxygenation: Pre-oxygenation with 100% oxygen Induction Type: IV induction Ventilation: Mask ventilation without difficulty LMA: LMA inserted LMA Size: 4.0 Number of attempts: 1 Airway Equipment and Method: Bite block Placement Confirmation: positive ETCO2 Tube secured with: Tape Dental Injury: Teeth and Oropharynx as per pre-operative assessment

## 2023-05-18 NOTE — Discharge Instructions (Addendum)

## 2023-05-18 NOTE — Transfer of Care (Signed)
 Immediate Anesthesia Transfer of Care Note  Patient: Carol [name]  Procedure(s) Performed: CARPAL TUNNEL RELEASE (Right: Wrist)  Patient Location: PACU  Anesthesia Type:General  Level of Consciousness: drowsy  Airway & Oxygen Therapy: Patient Spontanous Breathing and Patient connected to face mask oxygen  Post-op Assessment: Report given to RN and Post -op Vital signs reviewed and stable  Post vital signs: Reviewed and stable  Last Vitals:  Vitals Value Taken Time  BP 157/72 05/18/23 1539  Temp    Pulse 64 05/18/23 1540  Resp 13 05/18/23 1540  SpO2 98 % 05/18/23 1540  Vitals shown include unfiled device data.  Last Pain:  Vitals:   05/18/23 1215  TempSrc: Temporal  PainSc: 1       Patients Stated Pain Goal: 3 (05/18/23 1215)  Complications: No notable events documented.

## 2023-05-18 NOTE — Anesthesia Preprocedure Evaluation (Signed)
 Anesthesia Evaluation  Patient identified by MRN, date of birth, ID band Patient awake    Reviewed: Allergy & Precautions, NPO status , Patient's Chart, lab work & pertinent test results  History of Anesthesia Complications Negative for: history of anesthetic complications  Airway Mallampati: III  TM Distance: >3 FB Neck ROM: Full    Dental  (+) Teeth Intact, Dental Advisory Given   Pulmonary neg pulmonary ROS   breath sounds clear to auscultation       Cardiovascular  Rhythm:Regular  1. Left ventricular ejection fraction, by estimation, is 60 to 65%. The  left ventricle has normal function. The left ventricle has no regional  wall motion abnormalities. Left ventricular diastolic parameters are  consistent with Grade I diastolic  dysfunction (impaired relaxation).   2. Right ventricular systolic function is normal. The right ventricular  size is normal.   3. The mitral valve is normal in structure. No evidence of mitral valve  regurgitation. No evidence of mitral stenosis.   4. The aortic valve is tricuspid. There is moderate calcification of the  aortic valve. Aortic valve regurgitation is not visualized. Aortic valve  sclerosis/calcification is present, without any evidence of aortic  stenosis.   5. The inferior vena cava is normal in size with greater than 50%  respiratory variability, suggesting right atrial pressure of 3 mmHg.     Neuro/Psych negative neurological ROS  negative psych ROS   GI/Hepatic negative GI ROS, Neg liver ROS,,,  Endo/Other  negative endocrine ROS    Renal/GU negative Renal ROS     Musculoskeletal negative musculoskeletal ROS (+)    Abdominal   Peds  Hematology negative hematology ROS (+)   Anesthesia Other Findings   Reproductive/Obstetrics                              Anesthesia Physical Anesthesia Plan  ASA: 2  Anesthesia Plan: General   Post-op  Pain Management: Minimal or no pain anticipated   Induction: Intravenous  PONV Risk Score and Plan: 3 and Ondansetron and Dexamethasone  Airway Management Planned: LMA  Additional Equipment: None  Intra-op Plan:   Post-operative Plan: Extubation in OR  Informed Consent: I have reviewed the patients History and Physical, chart, labs and discussed the procedure including the risks, benefits and alternatives for the proposed anesthesia with the patient or authorized representative who has indicated his/her understanding and acceptance.     Dental advisory given  Plan Discussed with: CRNA  Anesthesia Plan Comments:          Anesthesia Quick Evaluation

## 2023-05-18 NOTE — Op Note (Signed)
 05/18/2023 [facility]                              OPERATIVE REPORT   PREOPERATIVE DIAGNOSIS:  Right carpal tunnel syndrome  POSTOPERATIVE DIAGNOSIS:  Right carpal tunnel syndrome  PROCEDURE:  Right carpal tunnel release  SURGEON:  Betha Loa, MD  ASSISTANT:  none.  ANESTHESIA: General  IV FLUIDS:  Per anesthesia flow sheet  ESTIMATED BLOOD LOSS:  Minimal  COMPLICATIONS:  None  SPECIMENS:  None  TOURNIQUET TIME:    Total Tourniquet Time Documented: Upper Arm (Right) - 10 minutes Total: Upper Arm (Right) - 10 minutes   DISPOSITION:  Stable to PACU  LOCATION: [facility]  INDICATIONS:  78 y.o. yo female with numbness and tingling right hand.  Positive nerve conduction studies. She wishes to proceed with right carpal tunnel release.  Risks, benefits and alternatives of surgery were discussed including the risk of blood loss; infection; damage to nerves, vessels, tendons, ligaments, bone; failure of surgery; need for additional surgery; complications with wound healing; continued pain; recurrence of carpal tunnel syndrome; and damage to motor branch. She voiced understanding of these risks and elected to proceed.   OPERATIVE COURSE:  After being identified preoperatively by myself, the patient and I agreed upon the procedure and site of procedure.  The surgical site was marked.  Surgical consent had been signed.  She was given preoperative IV antibiotic prophylaxis.  She was transferred to the operating room and placed on the operating room table in supine position with the Right upper extremity on an armboard.  General anesthesia was induced by the anesthesiologist.  Right upper extremity was prepped and draped in normal sterile orthopaedic fashion.  A surgical pause was performed between the surgeons, anesthesia, and operating room staff, and all were in agreement as to the patient, procedure, and site of procedure.  Tourniquet at the proximal aspect  of the extremity was inflated to 250 mmHg after exsanguination of the arm with an Esmarch bandage  Incision was made over the transverse carpal ligament and carried into the subcutaneous tissues by spreading technique.  Bipolar electrocautery was used to obtain hemostasis.  The palmar fascia was sharply incised.  The transverse carpal ligament was identified.  The fascia distal to the ligament was opened.  Retractor was placed and the flexor tendons were identified.  The flexor tendon to the little finger was identified and retracted radially.  The transverse carpal ligament was then incised from distal to proximal under direct visualization.  Scissors were used to split the distal aspect of the volar antebrachial fascia.  A finger was placed into the wound to ensure complete decompression, which was the case.  The nerve was examined.  It was adherent to the radial leaflet.  The motor branch was identified and was intact.  The wound was copiously irrigated with sterile saline.  It was then closed with 4-0 nylon in a horizontal mattress fashion.  It was injected with 0.25% plain Marcaine to aid in postoperative analgesia.  It was dressed with sterile Xeroform, 4x4s, an ABD, and wrapped with Kerlix and an Ace bandage.  Tourniquet was deflated at 10 minutes.  Fingertips were pink with brisk capillary refill after deflation of the tourniquet.  Operative drapes were broken down.  The patient was awoken from anesthesia safely.  She was transferred back to stretcher and taken to the PACU in stable condition.  I will  see her back in the office in 1 week for postoperative followup.  I will give her a prescription for Norco 5/325 1 tab PO q6 hours prn pain, dispense # 15.    Betha Loa, MD Electronically signed, 05/18/23

## 2023-05-18 NOTE — H&P (Signed)
 Carol [name] is an 78 y.o. female.   Chief Complaint: carpal tunnel syndrome HPI: 78 y.o. yo female with numbness and tingling right hand.  Positive nerve conduction studies. She wishes to have right carpal tunnel release.   Allergies:  Allergies  Allergen Reactions   Penicillins Hives   Sulfa Antibiotics    Sulfamethoxazole Itching   Lipitor [Atorvastatin] Diarrhea    Past Medical History:  Diagnosis Date   Carotid artery stenosis    Cataract    Diverticulosis    Fractured hand    right   Hyperlipidemia    Osteopenia    Precancerous skin lesion 2016   Squamous cell carcinoma of skin 12/02/2015   left forehead (CX35FU)   Squamous cell carcinoma of skin 12/02/2015   in situ- right cheek (CX35FU)``   Squamous cell carcinoma of skin 07/13/2017   in situ-left forehead/hairline   Squamous cell carcinoma of skin 11/13/2017   in situ- left forehead medial (TXPBX)    Past Surgical History:  Procedure Laterality Date   CATARACT EXTRACTION W/ INTRAOCULAR LENS IMPLANT Bilateral    CHOLECYSTECTOMY  2009   EYE SURGERY     hand fracture surgery      Family History: Family History  Problem Relation Age of Onset   Heart disease Mother    Heart disease Father    Cancer Sister        breast   Hyperlipidemia Sister    Diabetes Sister    Cancer Sister        bone   Alzheimer's disease Sister    Cancer Sister        breast   Kidney disease Sister    Heart disease Brother    CAD Brother    Heart disease Brother    Hypertension Brother    Alzheimer's disease Brother    Kidney disease Brother        2018 starting dialysis    Social History:   reports that she has never smoked. She has never used smokeless tobacco. She reports that she does not drink alcohol and does not use drugs.  Medications: Medications Prior to Admission  Medication Sig Dispense Refill   aspirin EC 81 MG tablet Take 81 mg by mouth daily.     Calcium 500-125 MG-UNIT TABS 1 tablet. 1 tablet daily.      Cholecalciferol (VITAMIN D3) 2000 units capsule 1 capsule.     Evolocumab (REPATHA SURECLICK) 140 MG/ML SOAJ INJECT 1 DOSE SUBCUTANEOUSLY ONCE EVERY 14 DAYS 6 mL 3   rosuvastatin (CRESTOR) 5 MG tablet TAKE 1/2 TABLET BY MOUTH THREE TIMES A WEEK 18 tablet 2    No results found for this or any previous visit (from the past 48 hours).  No results found.    Blood pressure (!) 166/89, pulse 67, temperature (!) 97.5 F (36.4 C), temperature source Temporal, resp. rate 20, height 5\' 6"  (1.676 m), weight 89.5 kg, SpO2 97%.  General appearance: alert, cooperative, and appears stated age Head: Normocephalic, without obvious abnormality, atraumatic Neck: supple, symmetrical, trachea midline Extremities: Intact sensation and capillary refill all digits.  +epl/fpl/io.  No wounds.  Skin: Skin color, texture, turgor normal. No rashes or lesions Neurologic: Grossly normal Incision/Wound: none  Assessment/Plan Right carpal tunnel syndrome.  Non operative and operative treatment options have been discussed with the patient and patient wishes to proceed with operative treatment. Risks, benefits, and alternatives of surgery have been discussed and the patient agrees with the plan of care.  Betha Loa 05/18/2023, 1:10 PM

## 2023-05-19 NOTE — Anesthesia Postprocedure Evaluation (Signed)
 Anesthesia Post Note  Patient: Carol [name]  Procedure(s) Performed: CARPAL TUNNEL RELEASE (Right: Wrist)     Patient location during evaluation: PACU Anesthesia Type: General Level of consciousness: awake and alert Pain management: pain level controlled Vital Signs Assessment: post-procedure vital signs reviewed and stable Respiratory status: spontaneous breathing, nonlabored ventilation and respiratory function stable Cardiovascular status: blood pressure returned to baseline and stable Postop Assessment: no apparent nausea or vomiting Anesthetic complications: no   No notable events documented.  Last Vitals:  Vitals:   05/18/23 1629 05/18/23 1651  BP:  (S) (!) (P) 197/66  Pulse: 66   Resp:    Temp:    SpO2: 98%     Last Pain:  Vitals:   05/18/23 1629  TempSrc:   PainSc: 5                  Louie Meaders

## 2023-05-20 ENCOUNTER — Encounter (HOSPITAL_BASED_OUTPATIENT_CLINIC_OR_DEPARTMENT_OTHER): Payer: Self-pay | Admitting: Orthopedic Surgery

## 2023-05-21 ENCOUNTER — Other Ambulatory Visit: Payer: Self-pay

## 2023-05-22 ENCOUNTER — Ambulatory Visit
Admission: EM | Admit: 2023-05-22 | Discharge: 2023-05-22 | Disposition: A | Discharge status: Home / Self Care | Attending: Nurse Practitioner | Admitting: Nurse Practitioner

## 2023-05-22 DIAGNOSIS — U071 COVID-19: Secondary | ICD-10-CM

## 2023-05-22 LAB — POC COVID19/FLU A&B COMBO
Covid Antigen, POC: POSITIVE — AB
Influenza A Antigen, POC: NEGATIVE
Influenza B Antigen, POC: NEGATIVE

## 2023-05-22 MED ORDER — LIDOCAINE VISCOUS HCL 2 % MT SOLN: OROMUCOSAL | 0 refills | Status: DC

## 2023-05-22 NOTE — ED Provider Notes (Signed)
 [facility]    CSN: [id number] Arrival date & time: 05/22/23  0805      History   Chief Complaint No chief complaint on file.   HPI Carol [name] is a 78 y.o. female.   The history is provided by the patient.   Patient presents for complaints of fever, nasal congestion, throat drainage, and hoarseness that started over the past 24 hours.  Patient states she did not check her temperature at home, states that her ears were red and she notes that is when she has a fever.  Denies headache, ear pain, wheezing, difficulty breathing, chest pain, abdominal pain, nausea, vomiting, diarrhea, or rash.  Patient reports that she has been taking Tylenol for her symptoms.  She denies any obvious known sick contacts.  Past Medical History:  Diagnosis Date   Carotid artery stenosis    Cataract    Diverticulosis    Fractured hand    right   Hyperlipidemia    Osteopenia    Precancerous skin lesion 2016   Squamous cell carcinoma of skin 12/02/2015   left forehead (CX35FU)   Squamous cell carcinoma of skin 12/02/2015   in situ- right cheek (CX35FU)``   Squamous cell carcinoma of skin 07/13/2017   in situ-left forehead/hairline   Squamous cell carcinoma of skin 11/13/2017   in situ- left forehead medial (TXPBX)    Patient Active Problem List   Diagnosis Date Noted   Positive self-administered antigen test for COVID-19 01/17/2021   Dysuria 08/16/2020   BMI 29.0-29.9,adult 05/15/2019   Plantar fasciitis of right foot 11/13/2018   Bilateral carotid artery stenosis 07/27/2017   Murmur 07/26/2016   Carotid stenosis 07/22/2014   Family history of heart disease 07/28/2013   Need for prophylactic vaccination and inoculation against influenza 11/26/2012   Diverticulosis    Osteopenia 09/11/2012   Hyperlipidemia LDL goal <70 07/23/2012   H/O infectious disease 01/02/2012   Pseudoptosis 11/07/2011    Past Surgical History:  Procedure Laterality Date   CARPAL TUNNEL RELEASE  Right 05/18/2023   Procedure: CARPAL TUNNEL RELEASE;  Surgeon: Betha Loa, MD;  Location: [facility];  Service: Orthopedics;  Laterality: Right;   CATARACT EXTRACTION W/ INTRAOCULAR LENS IMPLANT Bilateral    CHOLECYSTECTOMY  2009   EYE SURGERY     hand fracture surgery      OB History   No obstetric history on file.      Home Medications    Prior to Admission medications   Medication Sig Start Date End Date Taking? Authorizing Provider  aspirin EC 81 MG tablet Take 81 mg by mouth daily.    [provider]  Calcium 500-125 MG-UNIT TABS 1 tablet. 1 tablet daily.    [provider]  Cholecalciferol (VITAMIN D3) 2000 units capsule 1 capsule.    [provider]  Evolocumab (REPATHA SURECLICK) 140 MG/ML SOAJ INJECT 1 DOSE SUBCUTANEOUSLY ONCE EVERY 14 DAYS 11/13/22   Hilty, Lisette Abu, MD  HYDROcodone-acetaminophen (NORCO/VICODIN) 5-325 MG tablet Take 1 tablet by mouth every 6 (six) hours as needed for moderate pain (pain score 4-6). 05/18/23   Betha Loa, MD  rosuvastatin (CRESTOR) 5 MG tablet TAKE 1/2 TABLET BY MOUTH THREE TIMES A WEEK 11/15/22   Hilty, Lisette Abu, MD    Family History Family History  Problem Relation Age of Onset   Heart disease Mother    Heart disease Father    Cancer Sister        breast  Hyperlipidemia Sister    Diabetes Sister    Cancer Sister        bone   Alzheimer's disease Sister    Cancer Sister        breast   Kidney disease Sister    Heart disease Brother    CAD Brother    Heart disease Brother    Hypertension Brother    Alzheimer's disease Brother    Kidney disease Brother        2018 starting dialysis    Social History Social History   Tobacco Use   Smoking status: Never   Smokeless tobacco: Never  Vaping Use   Vaping status: Never Used  Substance Use Topics   Alcohol use: No   Drug use: No     Allergies   Penicillins, Sulfa antibiotics, Sulfamethoxazole, and Lipitor  [atorvastatin]   Review of Systems Review of Systems Per HPI  Physical Exam Triage Vital Signs ED Triage Vitals  Encounter Vitals Group     BP 05/22/23 0823 135/83     Systolic BP Percentile --      Diastolic BP Percentile --      Pulse Rate 05/22/23 0823 96     Resp 05/22/23 0823 20     Temp 05/22/23 0823 98.3 F (36.8 C)     Temp Source 05/22/23 0823 Oral     SpO2 05/22/23 0823 94 %     Weight --      Height --      Head Circumference --      Peak Flow --      Pain Score 05/22/23 0822 0     Pain Loc --      Pain Education --      Exclude from Growth Chart --    No data found.  Updated Vital Signs BP 135/83 (BP Location: Right Arm)   Pulse 96   Temp 98.3 F (36.8 C) (Oral)   Resp 20   SpO2 94%   Visual Acuity Right Eye Distance:   Left Eye Distance:   Bilateral Distance:    Right Eye Near:   Left Eye Near:    Bilateral Near:     Physical Exam Vitals and nursing note reviewed.  Constitutional:      General: She is not in acute distress.    Appearance: Normal appearance.  HENT:     Head: Normocephalic.     Right Ear: Tympanic membrane, ear canal and external ear normal.     Left Ear: Tympanic membrane, ear canal and external ear normal.     Nose: Nose normal.     Mouth/Throat:     Mouth: Mucous membranes are moist.     Pharynx: Posterior oropharyngeal erythema present.     Comments: Cobblestoning present to posterior oropharynx  Eyes:     Extraocular Movements: Extraocular movements intact.     Conjunctiva/sclera: Conjunctivae normal.     Pupils: Pupils are equal, round, and reactive to light.  Cardiovascular:     Rate and Rhythm: Normal rate and regular rhythm.     Pulses: Normal pulses.     Heart sounds: Normal heart sounds.  Pulmonary:     Effort: Pulmonary effort is normal. No respiratory distress.     Breath sounds: Normal breath sounds. No stridor. No wheezing, rhonchi or rales.  Abdominal:     General: Bowel sounds are normal.      Palpations: Abdomen is soft.     Tenderness: There is no abdominal  tenderness.  Musculoskeletal:     Cervical back: Normal range of motion.  Lymphadenopathy:     Cervical: No cervical adenopathy.  Skin:    General: Skin is warm and dry.  Neurological:     General: No focal deficit present.     Mental Status: She is alert and oriented to person, place, and time.  Psychiatric:        Mood and Affect: Mood normal.        Behavior: Behavior normal.      UC Treatments / Results  Labs (all labs ordered are listed, but only abnormal results are displayed) Labs Reviewed  POC COVID19/FLU A&B COMBO - Abnormal; Notable for the following components:      Result Value   Covid Antigen, POC Positive (*)    All other components within normal limits    EKG   Radiology No results found.  Procedures Procedures (including critical care time)  Medications Ordered in UC Medications - No data to display  Initial Impression / Assessment and Plan / UC Course  I have reviewed the triage vital signs and the nursing notes.  Pertinent labs & imaging results that were available during my care of the patient were reviewed by me and considered in my medical decision making (see chart for details).  On exam, lung sounds are clear throughout, room air sats at 94%.  COVID/flu test was positive for COVID.  Patient declined antiviral therapy, states that she took molnupiravir several years ago and experienced side effects.  Viscous lidocaine 2% prescribed for patient's throat pain.  Supportive care recommendations were provided and discussed with the patient to include fluids, rest, normal saline nasal spray, and warm salt water gargles for throat pain.  Discussed ER follow-up precautions.  Patient also advised to follow-up with her PCP to advise of recent COVID diagnosis.  Patient was in agreement with this plan of care and verbalized understanding.  All questions were answered.  Patient stable for  discharge.  Final Clinical Impressions(s) / UC Diagnoses   Final diagnoses:  None   Discharge Instructions   None    ED Prescriptions   None    PDMP not reviewed this encounter.   Abran Cantor, NP 05/22/23 [phone]

## 2023-05-22 NOTE — Discharge Instructions (Addendum)
 Your COVID/flu test was positive for COVID today.  You have declined antiviral therapy. Take medication as prescribed. Continue over-the-counter Tylenol as needed for pain, fever, or general discomfort. Warm salt water gargles 3-4 times daily as needed for throat pain or discomfort.  Also recommend the use of Chloraseptic throat spray, throat lozenges, and warm tea with honey and lemon while symptoms persist. May use normal saline nasal spray as needed for nasal congestion and runny nose. You should remain home if you develop a fever.  You should be fever free for 24 hours with no medication before returning to your normal activities. For the cough, recommend use of a humidifier in your bedroom at nighttime during sleep and sleeping elevated on pillows while symptoms persist. Continue to wear your mask as long as you are experiencing symptoms. If your symptoms worsen to include worsening fever, shortness of breath, difficulty breathing, or other concerns, please go to the emergency department immediately for further evaluation. Please notify your doctors of your recent COVID diagnosis. Follow-up as needed.

## 2023-05-22 NOTE — ED Triage Notes (Signed)
 Pt reports she has a fever, nasal congestion, throat drainage x 1 day

## 2023-05-30 ENCOUNTER — Encounter: Payer: Self-pay | Admitting: Family Medicine

## 2023-05-30 ENCOUNTER — Ambulatory Visit (INDEPENDENT_AMBULATORY_CARE_PROVIDER_SITE_OTHER): Admitting: Family Medicine

## 2023-05-30 VITALS — BP 134/86 | HR 76 | Temp 96.8°F | Ht 66.0 in | Wt 196.0 lb

## 2023-05-30 DIAGNOSIS — N3001 Acute cystitis with hematuria: Secondary | ICD-10-CM | POA: Diagnosis not present

## 2023-05-30 DIAGNOSIS — R300 Dysuria: Secondary | ICD-10-CM | POA: Diagnosis not present

## 2023-05-30 DIAGNOSIS — U071 COVID-19: Secondary | ICD-10-CM | POA: Diagnosis not present

## 2023-05-30 LAB — URINALYSIS, ROUTINE W REFLEX MICROSCOPIC
Bilirubin, UA: NEGATIVE
Glucose, UA: NEGATIVE
Ketones, UA: NEGATIVE
Nitrite, UA: POSITIVE — AB
Protein,UA: NEGATIVE
Specific Gravity, UA: 1.01 (ref 1.005–1.030)
Urobilinogen, Ur: 0.2 mg/dL (ref 0.2–1.0)
pH, UA: 5.5 (ref 5.0–7.5)

## 2023-05-30 LAB — MICROSCOPIC EXAMINATION
Renal Epithel, UA: NONE SEEN /HPF
WBC, UA: >30 /HPF — AB (ref 0–5)
Yeast, UA: NONE SEEN

## 2023-05-30 MED ORDER — CEPHALEXIN 500 MG PO CAPS: 500.0000 mg | ORAL_CAPSULE | Freq: Two times a day (BID) | ORAL | 0 refills | Status: AC

## 2023-05-30 NOTE — Progress Notes (Signed)
 Subjective:  Patient ID: Carol [name], female    DOB: Feb 12, 1946, 78 y.o.   MRN: [id number]  Patient Care Team: Raliegh Ip, DO as PCP - General (Family Medicine) Rennis Golden Lisette Abu, MD as PCP - Cardiology (Cardiology) Janalyn Harder, MD (Inactive) as Consulting Physician (Dermatology) Maris Berger, MD as Consulting Physician (Ophthalmology) Bernette Redbird, MD as Consulting Physician (Gastroenterology) Rennis Golden Lisette Abu, MD as Consulting Physician (Cardiology) Glyn Ade, PA-C as Physician Assistant (Dermatology) Duke, Roe Rutherford, [place] as Physician Assistant (Cardiology)   Chief Complaint:  Hospitalization Follow-up (05/22/2023 (39 minutes)/[facility] [place]- covid ) and Dysuria (X 1 week )   HPI: Carol [name] is a 78 y.o. female presenting on 05/30/2023 for Hospitalization Follow-up (05/22/2023 (39 minutes)/ [facility] at [facility]- covid ) and Dysuria (X 1 week )   Discussed the use of AI scribe software for clinical note transcription with the patient, who gave verbal consent to proceed.  History of Present Illness   Carol [name] is a 78 year old female who presents with burning during urination.  She has been experiencing burning during urination for approximately one week, describing it as discomfort and pressure in the lower abdomen. There is no hematuria, discharge, or changes in urine output. She has been taking Azo for three days without relief of symptoms.  She had COVID-19 starting on May 18, 2023, and was diagnosed at urgent care despite feeling well at the time. She recalls a previous COVID-19 infection three years ago that left her 'lifeless for a week.' Her recent symptoms included soreness from coughing, but all symptoms have resolved, including cough, chills, fever, fatigue, body aches, and rhinorrhea.  She has a history of allergies to penicillins and sulfa drugs. She is currently taking Rosadan three  times a week, half of a 5 mg dose, despite an allergy to statins. No current cough, chills, fever, fatigue, body aches, rhinorrhea, lower back pain, confusion, or weakness.          Relevant past medical, surgical, family, and social history reviewed and updated as indicated.  Allergies and medications reviewed and updated. Data reviewed: Chart in Epic.   Past Medical History:  Diagnosis Date   Carotid artery stenosis    Cataract    Diverticulosis    Fractured hand    right   Hyperlipidemia    Osteopenia    Precancerous skin lesion 2016   Squamous cell carcinoma of skin 12/02/2015   left forehead (CX35FU)   Squamous cell carcinoma of skin 12/02/2015   in situ- right cheek (CX35FU)``   Squamous cell carcinoma of skin 07/13/2017   in situ-left forehead/hairline   Squamous cell carcinoma of skin 11/13/2017   in situ- left forehead medial (TXPBX)    Past Surgical History:  Procedure Laterality Date   CARPAL TUNNEL RELEASE Right 05/18/2023   Procedure: CARPAL TUNNEL RELEASE;  Surgeon: Betha Loa, MD;  Location: [facility];  Service: Orthopedics;  Laterality: Right;   CATARACT EXTRACTION W/ INTRAOCULAR LENS IMPLANT Bilateral    CHOLECYSTECTOMY  2009   EYE SURGERY     hand fracture surgery      Social History   Socioeconomic History   Marital status: Widowed    Spouse name: Not on file   Number of children: 2   Years of education: 12   Highest education level: High school graduate  Occupational History   Occupation: retired    Associate Professor: Nurse, adult [employer]  Tobacco Use   Smoking status: Never   Smokeless tobacco: Never  Vaping Use   Vaping status: Never Used  Substance and Sexual Activity   Alcohol use: No   Drug use: No   Sexual activity: Not Currently  Other Topics Concern   Not on file  Social History Narrative   Lives alone.    Loves [employer]!   Granddaughter graduating from [employer].  She's very proud of her   One child in [place], one in  [place]   Social Drivers of Health   Financial Resource Strain: Low Risk  (12/13/2022)   Overall Financial Resource Strain (CARDIA)    Difficulty of Paying Living Expenses: Not hard at all  Food Insecurity: Low Risk  (05/01/2023)   Received from [facility]   Hunger Vital Sign    Worried About Running Out of Food in the Last Year: Never true    Ran Out of Food in the Last Year: Never true  Transportation Needs: No Transportation Needs (05/01/2023)   Received from [facility]    In the past 12 months, has lack of reliable transportation kept you from medical appointments, meetings, work or from getting things needed for daily living? : No  Physical Activity: Insufficiently Active (12/13/2022)   Exercise Vital Sign    Days of Exercise per Week: 3 days    Minutes of Exercise per Session: 30 min  Stress: No Stress Concern Present (12/13/2022)   [employer] of Occupational Health - Occupational Stress Questionnaire    Feeling of Stress : Not at all  Social Connections: Moderately Isolated (12/13/2022)   Social Connection and Isolation Panel [NHANES]    Frequency of Communication with Friends and Family: More than three times a week    Frequency of Social Gatherings with Friends and Family: More than three times a week    Attends Religious Services: More than 4 times per year    Active Member of [employer] or Organizations: No    Attends Banker Meetings: Never    Marital Status: Widowed  Intimate Partner Violence: Not At Risk (12/13/2022)   Humiliation, Afraid, Rape, and Kick questionnaire    Fear of Current or Ex-Partner: No    Emotionally Abused: No    Physically Abused: No    Sexually Abused: No    Outpatient Encounter Medications as of 05/30/2023  Medication Sig   aspirin EC 81 MG tablet Take 81 mg by mouth daily.   Calcium 500-125 MG-UNIT TABS 1 tablet. 1 tablet daily.   cephALEXin (KEFLEX) 500 MG capsule Take 1 capsule (500 mg total) by  mouth 2 (two) times daily for 7 days.   Cholecalciferol (VITAMIN D3) 2000 units capsule 1 capsule.   Evolocumab (REPATHA SURECLICK) 140 MG/ML SOAJ INJECT 1 DOSE SUBCUTANEOUSLY ONCE EVERY 14 DAYS   HYDROcodone-acetaminophen (NORCO/VICODIN) 5-325 MG tablet Take 1 tablet by mouth every 6 (six) hours as needed for moderate pain (pain score 4-6).   lidocaine (XYLOCAINE) 2 % solution Gargle and spit 5 mL every 6 hours as needed for throat pain or discomfort.   rosuvastatin (CRESTOR) 5 MG tablet TAKE 1/2 TABLET BY MOUTH THREE TIMES A WEEK   No facility-administered encounter medications on file as of 05/30/2023.    Allergies  Allergen Reactions   Penicillins Hives   Sulfa Antibiotics    Sulfamethoxazole Itching   Lipitor [Atorvastatin] Diarrhea   Prednisone Other (See Comments)    prednisone    Pertinent ROS per HPI, otherwise  unremarkable      Objective:  BP 134/86   Pulse 76   Temp (!) 96.8 F (36 C)   Ht 5\' 6"  (1.676 m)   Wt 196 lb (88.9 kg)   SpO2 98%   BMI 31.64 kg/m    Wt Readings from Last 3 Encounters:  05/30/23 196 lb (88.9 kg)  05/18/23 197 lb 5 oz (89.5 kg)  02/14/23 197 lb (89.4 kg)    Physical Exam Vitals and nursing note reviewed.  Constitutional:      General: She is not in acute distress.    Appearance: Normal appearance. She is obese. She is not ill-appearing, toxic-appearing or diaphoretic.  HENT:     Head: Normocephalic and atraumatic.     Right Ear: Decreased hearing noted.     Left Ear: Decreased hearing noted.     Ears:     Comments: Bilateral hearing aids    Nose: Nose normal.     Mouth/Throat:     Mouth: Mucous membranes are moist.  Eyes:     Conjunctiva/sclera: Conjunctivae normal.     Pupils: Pupils are equal, round, and reactive to light.     Comments: Ptosis  Cardiovascular:     Rate and Rhythm: Normal rate and regular rhythm.     Heart sounds: Murmur heard.     Systolic murmur is present with a grade of 2/6.  Pulmonary:     Effort:  Pulmonary effort is normal.     Breath sounds: Normal breath sounds.  Abdominal:     General: Bowel sounds are normal.     Palpations: Abdomen is soft.     Tenderness: There is no abdominal tenderness. There is no right CVA tenderness or left CVA tenderness.  Musculoskeletal:     Comments: Ace wrap on right arm from recent surgery.   Skin:    General: Skin is warm and dry.     Capillary Refill: Capillary refill takes less than 2 seconds.  Neurological:     General: No focal deficit present.     Mental Status: She is alert and oriented to person, place, and time.  Psychiatric:        Mood and Affect: Mood normal.        Behavior: Behavior normal.        Thought Content: Thought content normal.        Judgment: Judgment normal.      Results for orders placed or performed during the hospital encounter of 05/22/23  POC Covid19/Flu A&B Antigen   Collection Time: 05/22/23  8:46 AM  Result Value Ref Range   Influenza A Antigen, POC Negative Negative   Influenza B Antigen, POC Negative Negative   Covid Antigen, POC Positive (A) Negative       Pertinent labs & imaging results that were available during my care of the patient were reviewed by me and considered in my medical decision making.  Assessment & Plan:  Jaiyla was seen today for hospitalization follow-up and dysuria.  Diagnoses and all orders for this visit:  Acute cystitis with hematuria -     cephALEXin (KEFLEX) 500 MG capsule; Take 1 capsule (500 mg total) by mouth 2 (two) times daily for 7 days.  Dysuria -     Urinalysis, Routine w reflex microscopic -     Urine Culture  COVID-19 All symptoms have resolved.     Assessment and Plan    Urinary Tract Infection (UTI) Presents with symptoms of a UTI, including dysuria  and lower abdominal pressure. No hematuria, discharge, or new lower back pain. Known allergy to penicillins and sulfa drugs. Urinalysis confirmed UTI. Previously tolerated Keflex, a cephalosporin,  despite a slight risk of cross-reactivity with penicillin allergies. Informed consent obtained regarding the use of Keflex, with discussion of the low risk of cross-reactivity and the plan to adjust treatment based on culture results if necessary. - Prescribe Keflex twice a day for seven days - Order urine culture to ensure Keflex coverage - Advise increased fluid intake and caffeine avoidance - Instruct to continue medication as prescribed and await further instructions if culture results necessitate a change in treatment - Send prescription to [facility] pharmacy  Chronic Back Pain Reports chronic back pain with no new or worsening symptoms. Pain is unrelated to current urinary symptoms.  COVID-19 (resolved) Recent COVID-19 infection diagnosed on May 18, 2023, with symptoms including fatigue and cough. Reports all symptoms have resolved, including cough, chills, fever, fatigue, myalgia, and rhinorrhea.          Continue all other maintenance medications.  Follow up plan: Return if symptoms worsen or fail to improve.   Continue healthy lifestyle choices, including diet (rich in fruits, vegetables, and lean proteins, and low in salt and simple carbohydrates) and exercise (at least 30 minutes of moderate physical activity daily).  Educational handout given for cystitis   The above assessment and management plan was discussed with the patient. The patient verbalized understanding of and has agreed to the management plan. Patient is aware to call the clinic if they develop any new symptoms or if symptoms persist or worsen. Patient is aware when to return to the clinic for a follow-up visit. Patient educated on when it is appropriate to go to the emergency department.   Kari Baars, FNP-C [facility] [phone]

## 2023-06-01 DIAGNOSIS — Z4789 Encounter for other orthopedic aftercare: Secondary | ICD-10-CM | POA: Diagnosis not present

## 2023-06-01 DIAGNOSIS — G5601 Carpal tunnel syndrome, right upper limb: Secondary | ICD-10-CM | POA: Diagnosis not present

## 2023-06-03 LAB — URINE CULTURE

## 2023-06-05 ENCOUNTER — Telehealth: Payer: Self-pay

## 2023-06-05 DIAGNOSIS — D0439 Carcinoma in situ of skin of other parts of face: Secondary | ICD-10-CM | POA: Diagnosis not present

## 2023-06-05 NOTE — Telephone Encounter (Signed)
 Copied from CRM [id number]. Topic: Clinical - Lab/Test Results >> Jun 05, 2023 12:31 PM Elle L wrote: Reason for CRM: The patient returned the call regarding her lab work. I read the note to her verbatim and she expressed understanding.

## 2023-06-22 ENCOUNTER — Encounter: Payer: Self-pay | Admitting: Family Medicine

## 2023-06-22 DIAGNOSIS — D049 Carcinoma in situ of skin, unspecified: Secondary | ICD-10-CM | POA: Insufficient documentation

## 2023-07-19 ENCOUNTER — Other Ambulatory Visit: Payer: Self-pay | Admitting: Internal Medicine

## 2023-07-19 DIAGNOSIS — E785 Hyperlipidemia, unspecified: Secondary | ICD-10-CM

## 2023-07-19 DIAGNOSIS — Z79899 Other long term (current) drug therapy: Secondary | ICD-10-CM

## 2023-08-14 ENCOUNTER — Ambulatory Visit: Payer: Self-pay | Admitting: Physician Assistant

## 2023-08-14 ENCOUNTER — Ambulatory Visit (HOSPITAL_COMMUNITY)
Admission: RE | Admit: 2023-08-14 | Discharge: 2023-08-14 | Disposition: A | Discharge status: Home / Self Care | Source: Ambulatory Visit | Attending: Physician Assistant | Admitting: Physician Assistant

## 2023-08-14 DIAGNOSIS — I6523 Occlusion and stenosis of bilateral carotid arteries: Secondary | ICD-10-CM | POA: Diagnosis not present

## 2023-08-15 NOTE — Telephone Encounter (Signed)
 Spoke with pt. Pt was notified of carotid results.

## 2023-08-24 DIAGNOSIS — H26493 Other secondary cataract, bilateral: Secondary | ICD-10-CM | POA: Diagnosis not present

## 2023-08-29 DIAGNOSIS — K080 Exfoliation of teeth due to systemic causes: Secondary | ICD-10-CM | POA: Diagnosis not present

## 2023-09-07 DIAGNOSIS — I6523 Occlusion and stenosis of bilateral carotid arteries: Secondary | ICD-10-CM

## 2023-09-07 DIAGNOSIS — E785 Hyperlipidemia, unspecified: Secondary | ICD-10-CM

## 2023-09-07 NOTE — Progress Notes (Unsigned)
 Cardiology Office Note:    Date:  09/11/2023   ID:  Carol [name], DOB 1945/03/26, MRN [id number]  PCP:  Carol Norene HERO, DO   [facility] Providers Cardiologist:  Vinie JAYSON Maxcy, MD Cardiology APP:  Madie Jon Garre, PA     Referring MD: Carol Norene HERO, DO   Chief Complaint  Patient presents with   carotid stenosis    Heart Murmur   Hyperlipidemia   Hypertension    History of Present Illness:    Carol [name] is a 78 y.o. female with a hx of mild to moderate bilateral carotid artery stenosis and HLD now on repatha . She has a family history of premature coronary artery disease. She has followed with Dr. Maxcy and was last seen 11/2018. She was doing well at that time. Lipids responded well to repatha . She has struggled with elevated triglycerides.   She presents for follow up. She had carpal tunnel surgery in May. She was hypertensive in pacu. She has also skin caner the good kind. No ongoing treatments.   She has no cardiac complaints. BP running a little high, has eaten sodium recently. She will continue to check at home.      Past Medical History:  Diagnosis Date   Carotid artery stenosis    Cataract    Diverticulosis    Fractured hand    right   Hyperlipidemia    Osteopenia    Precancerous skin lesion 2016   Squamous cell carcinoma of skin 12/02/2015   left forehead (CX35FU)   Squamous cell carcinoma of skin 12/02/2015   in situ- right cheek (CX35FU)``   Squamous cell carcinoma of skin 07/13/2017   in situ-left forehead/hairline   Squamous cell carcinoma of skin 11/13/2017   in situ- left forehead medial (TXPBX)    Past Surgical History:  Procedure Laterality Date   CARPAL TUNNEL RELEASE Right 05/18/2023   Procedure: CARPAL TUNNEL RELEASE;  Surgeon: Murrell Drivers, MD;  Location: [facility];  Service: Orthopedics;  Laterality: Right;   CATARACT EXTRACTION W/ INTRAOCULAR LENS IMPLANT Bilateral    CHOLECYSTECTOMY  2009    EYE SURGERY     hand fracture surgery      Current Medications: Current Meds  Medication Sig   aspirin EC 81 MG tablet Take 81 mg by mouth daily.   Calcium  500-125 MG-UNIT TABS 1 tablet. 1 tablet daily.   Cholecalciferol (VITAMIN D3) 2000 units capsule 1 capsule.   Evolocumab  (REPATHA  SURECLICK) 140 MG/ML SOAJ INJECT 1 DOSE SUBCUTANEOUSLY ONCE EVERY 14 DAYS   rosuvastatin  (CRESTOR ) 5 MG tablet TAKE 1/2 (ONE-HALF) TABLET BY MOUTH THREE TIMES A WEEK     Allergies:   Penicillins, Sulfa antibiotics, Sulfamethoxazole, Lipitor [atorvastatin ], and Prednisone   Social History   Socioeconomic History   Marital status: Widowed    Spouse name: Not on file   Number of children: 2   Years of education: 12   Highest education level: High school graduate  Occupational History   Occupation: retired    Associate Professor: Nurse, [employer]  Tobacco Use   Smoking status: Never   Smokeless tobacco: Never  Vaping Use   Vaping status: Never Used  Substance and Sexual Activity   Alcohol use: No   Drug use: No   Sexual activity: Not Currently  Other Topics Concern   Not on file  Social History Narrative   Lives alone.    Loves [employer]!   Granddaughter graduating from [employer].  She's very  proud of her   One child in [place], one in [place]   Social Drivers of Health   Financial Resource Strain: Low Risk  (12/13/2022)   Overall Financial Resource Strain (CARDIA)    Difficulty of Paying Living Expenses: Not hard at all  Food Insecurity: Low Risk  (06/01/2023)   Received from [facility]   Hunger Vital Sign    Within the past 12 months, you worried that your food would run out before you got money to buy more: Never true    Within the past 12 months, the food you bought just didn't last and you didn't have money to get more. : Never true  Transportation Needs: No Transportation Needs (06/01/2023)   Received from [facility]    In the past 12 months, has lack of reliable  transportation kept you from medical appointments, meetings, work or from getting things needed for daily living? : No  Physical Activity: Insufficiently Active (12/13/2022)   Exercise Vital Sign    Days of Exercise per Week: 3 days    Minutes of Exercise per Session: 30 min  Stress: No Stress Concern Present (12/13/2022)   [employer] of Occupational Health - Occupational Stress Questionnaire    Feeling of Stress : Not at all  Social Connections: Moderately Isolated (12/13/2022)   Social Connection and Isolation Panel    Frequency of Communication with Friends and Family: More than three times a week    Frequency of Social Gatherings with Friends and Family: More than three times a week    Attends Religious Services: More than 4 times per year    Active Member of [employer] or Organizations: No    Attends Banker Meetings: Never    Marital Status: Widowed     Family History: The patient's family history includes Alzheimer's disease in her brother and sister; CAD in her brother; Cancer in her sister, sister, and sister; Diabetes in her sister; Heart disease in her brother, brother, father, and mother; Hyperlipidemia in her sister; Hypertension in her brother; Kidney disease in her brother and sister.  ROS:   Please see the history of present illness.     All other systems reviewed and are negative.  EKGs/Labs/Other Studies Reviewed:    The following studies were reviewed today:  EKG Interpretation Date/Time:  Tuesday September 11 2023 09:31:05 EDT Ventricular Rate:  60 PR Interval:  194 QRS Duration:  84 QT Interval:  426 QTC Calculation: 426 R Axis:   80  Text Interpretation: Normal sinus rhythm Normal ECG When compared with ECG of 21-Aug-2022 08:07, No significant change was found Confirmed by Madie Slough ([id number]) on 09/11/2023 9:37:12 AM    Recent Labs: No results found for requested labs within last 365 days.  Recent Lipid Panel    Component Value Date/Time    CHOL 113 08/21/2022 0905   CHOL 151 10/22/2012 0833   TRIG 165 (H) 08/21/2022 0905   TRIG 260 (H) 12/21/2016 1618   TRIG 80 10/22/2012 0833   HDL 51 08/21/2022 0905   HDL 47 12/21/2016 1618   HDL 51 10/22/2012 0833   CHOLHDL 2.2 08/21/2022 0905   LDLCALC 35 08/21/2022 0905   LDLCALC 87 07/29/2013 0804   LDLCALC 84 10/22/2012 0833   LDLDIRECT 93 10/08/2014 0759     Risk Assessment/Calculations:          Physical Exam:    VS:  BP (!) 140/90   Pulse 60   Ht 5' 6 (  1.676 m)   Wt 194 lb 9.6 oz (88.3 kg)   SpO2 97%   BMI 31.41 kg/m     Wt Readings from Last 3 Encounters:  09/11/23 194 lb 9.6 oz (88.3 kg)  05/30/23 196 lb (88.9 kg)  05/18/23 197 lb 5 oz (89.5 kg)     GEN:  Well nourished, well developed in no acute distress HEENT: Normal NECK: No JVD; No carotid bruits LYMPHATICS: No lymphadenopathy CARDIAC: RRR, 2/6 systolic murmur, rubs, gallops RESPIRATORY:  Clear to auscultation without rales, wheezing or rhonchi  ABDOMEN: Soft, non-tender, non-distended MUSCULOSKELETAL:  No edema; No deformity  SKIN: Warm and dry NEUROLOGIC:  Alert and oriented x 3 PSYCHIATRIC:  Normal affect   ASSESSMENT:    1. Family history of heart disease   2. Murmur   3. Stenosis of carotid artery, unspecified laterality   4. Bilateral carotid artery stenosis   5. Primary hypertension   6. Hyperlipidemia with target LDL less than 70    PLAN:    In order of problems listed above:  Hyperlipidemia - pt has done well on repatha  + 5 mg crestor  - recheck fasting labs - triglycerides elevated, never started on lovaza  - check today   Carotic artery stenosis - US  07/2023 stable disease - recheck in 1 year   Cardiac murmur - reassuring echo 08/2022, no significant valvular disease   Hypertension - has white coat hypertension - not on anti-hypertensives - she will continue to follow at home - she had increased sodium over the last 2 days - BP goal < 130/80 - she will let me  know if it is consistently elevated, would add 2.5 mg amlodipine   Bilateral hand tingling - carpal tunnel like symptoms - underwent carpal tunnel release surgery   Will follow up in 1 year with me or Dr. Mona.    Medication Adjustments/Labs and Tests Ordered: Current medicines are reviewed at length with the patient today.  Concerns regarding medicines are outlined above.  Orders Placed This Encounter  Procedures   Lipid panel   EKG 12-Lead   VAS US  CAROTID   No orders of the defined types were placed in this encounter.   Patient Instructions  Medication Instructions:  The current medical regimen is effective;  continue present plan and medications.  *If you need a refill on your cardiac medications before your next appointment, please call your pharmacy*  Lab Work: FASTING LIPID PANEL TODAY If you have labs (blood work) drawn today and your tests are completely normal, you will receive your results only by: MyChart Message (if you have MyChart) OR A paper copy in the mail If you have any lab test that is abnormal or we need to change your treatment, we will call you to review the results.  Testing/Procedures: Carotid Ultrasound Your physician has requested that you have a carotid duplex. This test is an ultrasound of the carotid arteries in your neck. It looks at blood flow through these arteries that supply the brain with blood. Allow one hour for this exam. There are no restrictions or special instructions.   Follow-Up: At [facility], you and your health needs are our priority.  As part of our continuing mission to provide you with exceptional heart care, our providers are all part of one team.  This team includes your primary Cardiologist (physician) and Advanced Practice Providers or APPs (Physician Assistants and Nurse Practitioners) who all work together to provide you with the care you need, when you  need it.  Your next appointment:   1  Year  Provider:   Mercy Hails PA-C  We recommend signing up for the patient portal called MyChart.  Sign up information is provided on this After Visit Summary.  MyChart is used to connect with patients for Virtual Visits (Telemedicine).  Patients are able to view lab/test results, encounter notes, upcoming appointments, etc.  Non-urgent messages can be sent to your provider as well.   To learn more about what you can do with MyChart, go to [email or web address].   Other Instructions CHECK BLOOD PRESSURE DAILY AND NOTIFY OFFICE IF GREATER THAN 130/80  Use Voltaren (diclofenac) cream for left should pain       Signed, Jon Nat Hails, PA  09/11/2023 10:17 AM    [facility]

## 2023-09-11 ENCOUNTER — Encounter: Payer: Self-pay | Admitting: Physician Assistant

## 2023-09-11 ENCOUNTER — Ambulatory Visit: Attending: Cardiology | Admitting: Physician Assistant

## 2023-09-11 VITALS — BP 140/90 | HR 60 | Ht 66.0 in | Wt 194.6 lb

## 2023-09-11 DIAGNOSIS — I6523 Occlusion and stenosis of bilateral carotid arteries: Secondary | ICD-10-CM | POA: Diagnosis not present

## 2023-09-11 DIAGNOSIS — Z8249 Family history of ischemic heart disease and other diseases of the circulatory system: Secondary | ICD-10-CM

## 2023-09-11 DIAGNOSIS — I6529 Occlusion and stenosis of unspecified carotid artery: Secondary | ICD-10-CM | POA: Diagnosis not present

## 2023-09-11 DIAGNOSIS — E785 Hyperlipidemia, unspecified: Secondary | ICD-10-CM | POA: Diagnosis not present

## 2023-09-11 DIAGNOSIS — Z79899 Other long term (current) drug therapy: Secondary | ICD-10-CM

## 2023-09-11 DIAGNOSIS — I10 Essential (primary) hypertension: Secondary | ICD-10-CM | POA: Diagnosis not present

## 2023-09-11 DIAGNOSIS — R011 Cardiac murmur, unspecified: Secondary | ICD-10-CM | POA: Diagnosis not present

## 2023-09-11 MED ORDER — REPATHA SURECLICK 140 MG/ML ~~LOC~~ SOAJ: 140.0000 mg | SUBCUTANEOUS | 3 refills | Status: AC

## 2023-09-11 MED ORDER — ROSUVASTATIN CALCIUM 5 MG PO TABS: ORAL_TABLET | ORAL | 3 refills | Status: AC

## 2023-09-11 NOTE — Telephone Encounter (Signed)
-----   Message from Nurse Con HERO sent at 09/11/2023 10:20 AM EDT ----- Regarding: Repatha  Refill Can you please look at refilling prescription and reviewing instructions. Thank you!

## 2023-09-11 NOTE — Patient Instructions (Addendum)
 Medication Instructions:  START over the counter Voltaren (diclofenac) cream for left should pain  *If you need a refill on your cardiac medications before your next appointment, please call your pharmacy*  Lab Work: FASTING LIPID PANEL TODAY If you have labs (blood work) drawn today and your tests are completely normal, you will receive your results only by: MyChart Message (if you have MyChart) OR A paper copy in the mail If you have any lab test that is abnormal or we need to change your treatment, we will call you to review the results.  Testing/Procedures: Carotid Ultrasound in June 2026 Your physician has requested that you have a carotid duplex. This test is an ultrasound of the carotid arteries in your neck. It looks at blood flow through these arteries that supply the brain with blood. Allow one hour for this exam. There are no restrictions or special instructions.   Follow-Up: At [facility], you and your health needs are our priority.  As part of our continuing mission to provide you with exceptional heart care, our providers are all part of one team.  This team includes your primary Cardiologist (physician) and Advanced Practice Providers or APPs (Physician Assistants and Nurse Practitioners) who all work together to provide you with the care you need, when you need it.  Your next appointment:   1 Year  Provider:   Mercy Hails PA-C  We recommend signing up for the patient portal called MyChart.  Sign up information is provided on this After Visit Summary.  MyChart is used to connect with patients for Virtual Visits (Telemedicine).  Patients are able to view lab/test results, encounter notes, upcoming appointments, etc.  Non-urgent messages can be sent to your provider as well.   To learn more about what you can do with MyChart, go to [email or web address].   Other Instructions CHECK BLOOD PRESSURE DAILY AND NOTIFY OFFICE IF GREATER THAN 130/80

## 2023-09-12 ENCOUNTER — Ambulatory Visit: Payer: Self-pay | Admitting: Physician Assistant

## 2023-09-12 LAB — LIPID PANEL
Chol/HDL Ratio: 2.2 ratio (ref 0.0–4.4)
Cholesterol, Total: 122 mg/dL (ref 100–199)
HDL: 55 mg/dL (ref >39)
LDL Chol Calc (NIH): 39 mg/dL (ref 0–99)
Triglycerides: 172 mg/dL — ABNORMAL HIGH (ref 0–149)
VLDL Cholesterol Cal: 28 mg/dL (ref 5–40)

## 2023-09-12 MED ORDER — ICOSAPENT ETHYL 1 G PO CAPS: 2.0000 g | ORAL_CAPSULE | Freq: Two times a day (BID) | ORAL | 3 refills | Status: AC

## 2023-09-12 NOTE — Telephone Encounter (Signed)
 Spoke with pt. Pt was notified of lab results. Pt is in agreement with starting Vascepa  2 g bid. Medication sent to pts pharmacy.

## 2023-10-01 DIAGNOSIS — H26491 Other secondary cataract, right eye: Secondary | ICD-10-CM | POA: Diagnosis not present

## 2023-10-15 DIAGNOSIS — H26492 Other secondary cataract, left eye: Secondary | ICD-10-CM | POA: Diagnosis not present

## 2023-10-18 DIAGNOSIS — C44311 Basal cell carcinoma of skin of nose: Secondary | ICD-10-CM | POA: Diagnosis not present

## 2023-10-18 DIAGNOSIS — Z85828 Personal history of other malignant neoplasm of skin: Secondary | ICD-10-CM | POA: Diagnosis not present

## 2023-10-18 DIAGNOSIS — C44519 Basal cell carcinoma of skin of other part of trunk: Secondary | ICD-10-CM | POA: Diagnosis not present

## 2023-10-18 DIAGNOSIS — D1801 Hemangioma of skin and subcutaneous tissue: Secondary | ICD-10-CM | POA: Diagnosis not present

## 2023-10-18 DIAGNOSIS — L570 Actinic keratosis: Secondary | ICD-10-CM | POA: Diagnosis not present

## 2023-10-18 DIAGNOSIS — L720 Epidermal cyst: Secondary | ICD-10-CM | POA: Diagnosis not present

## 2023-10-18 DIAGNOSIS — L821 Other seborrheic keratosis: Secondary | ICD-10-CM | POA: Diagnosis not present

## 2023-12-03 DIAGNOSIS — C44311 Basal cell carcinoma of skin of nose: Secondary | ICD-10-CM | POA: Diagnosis not present

## 2023-12-06 ENCOUNTER — Ambulatory Visit: Admitting: Family Medicine

## 2023-12-12 ENCOUNTER — Ambulatory Visit: Admitting: Family Medicine

## 2023-12-12 ENCOUNTER — Encounter: Payer: Self-pay | Admitting: Family Medicine

## 2023-12-12 ENCOUNTER — Ambulatory Visit: Payer: Self-pay

## 2023-12-12 VITALS — BP 129/71 | HR 63 | Temp 98.0°F | Ht 66.0 in | Wt 190.8 lb

## 2023-12-12 DIAGNOSIS — R300 Dysuria: Secondary | ICD-10-CM | POA: Diagnosis not present

## 2023-12-12 DIAGNOSIS — N3090 Cystitis, unspecified without hematuria: Secondary | ICD-10-CM

## 2023-12-12 LAB — MICROSCOPIC EXAMINATION
Bacteria, UA: NONE SEEN
RBC, Urine: NONE SEEN /HPF (ref 0–2)
Renal Epithel, UA: NONE SEEN /HPF
WBC, UA: NONE SEEN /HPF (ref 0–5)
Yeast, UA: NONE SEEN

## 2023-12-12 LAB — URINALYSIS, ROUTINE W REFLEX MICROSCOPIC
Bilirubin, UA: NEGATIVE
Glucose, UA: NEGATIVE
Ketones, UA: NEGATIVE
Leukocytes,UA: NEGATIVE
Nitrite, UA: NEGATIVE
Protein,UA: NEGATIVE
Specific Gravity, UA: 1.01 (ref 1.005–1.030)
Urobilinogen, Ur: 0.2 mg/dL (ref 0.2–1.0)
pH, UA: 5.5 (ref 5.0–7.5)

## 2023-12-12 MED ORDER — CIPROFLOXACIN HCL 250 MG PO TABS: 250.0000 mg | ORAL_TABLET | Freq: Two times a day (BID) | ORAL | 0 refills | Status: AC

## 2023-12-12 NOTE — Telephone Encounter (Signed)
 Apt scheduled.

## 2023-12-12 NOTE — Telephone Encounter (Signed)
 FYI Only or Action Required?: FYI only for provider.  Patient was last seen in primary care on 05/30/2023 by Severa Rock HERO, FNP.  Called Nurse Triage reporting Dysuria.  Symptoms began several days ago.  Interventions attempted: OTC medications: Azo.  Symptoms are: unchanged.  Triage Disposition: See Physician Within 24 Hours (overriding See PCP Within 2 Weeks)  Patient/caregiver understands and will follow disposition?: Yes  Copied from CRM [id number]. Topic: Clinical - Red Word Triage >> Dec 12, 2023  9:29 AM Carol [name] wrote: Pt think she has a UTI started about 2 days ago and she started taking the otc meds and don't think they work Reason for Disposition  All other urine symptoms  Answer Assessment - Initial Assessment Questions 1. SYMPTOM: What's the main symptom you're concerned about? (e.g., frequency, incontinence)     Burning with urination  2. ONSET: When did the  Burning with urination  start?     2 days ago  3. PAIN: Is there any pain? If Yes, ask: How bad is it? (Scale: 1-10; mild, moderate, severe)     Burning pain  4. CAUSE: What do you think is causing the symptoms?     UTI  5. OTHER SYMPTOMS: Do you have any other symptoms? (e.g., blood in urine, fever, flank pain, pain with urination)     Denies. No fever , blood in urine or flank pain. Taking Azo, has been helpful. Reports having UTI's in the past with similar symptoms.  Protocols used: Urinary Symptoms-A-AH

## 2023-12-12 NOTE — Progress Notes (Signed)
 Subjective:  Patient ID: Carol [name], female    DOB: [birth year]  Age: 78 y.o. MRN: [id number]  CC: Dysuria (Burning urination)   HPI  Discussed the use of AI scribe software for clinical note transcription with the patient, who gave verbal consent to proceed.  History of Present Illness Carol [name] is an 78 year old female who presents with burning urination.  She has been experiencing burning during urination and initially tried over-the-counter medication, which provided some relief. However, she is concerned about the persistence of symptoms, especially while she is out of town. She has increased her fluid intake, drinking lots of water and cranberry juice. She has not consumed Pepsi for three days, opting for water instead.  No increased frequency of urination, urgency, fever, chills, sweating spells, or flank pain. She has a history of back issues, which have been managed by a chiropractor.  She remains active, maintaining a garden and staying busy with outdoor activities.          12/12/2023    1:18 PM 05/30/2023    9:10 AM 02/14/2023    9:06 AM  Depression screen PHQ 2/9  Decreased Interest 0 0 0  Down, Depressed, Hopeless 0 0 0  PHQ - 2 Score 0 0 0    History Carol [name] has a past medical history of Carotid artery stenosis, Cataract, Diverticulosis, Fractured hand, Hyperlipidemia, Osteopenia, Precancerous skin lesion (2016), Squamous cell carcinoma of skin (12/02/2015), Squamous cell carcinoma of skin (12/02/2015), Squamous cell carcinoma of skin (07/13/2017), and Squamous cell carcinoma of skin (11/13/2017).   She has a past surgical history that includes Eye surgery; Cataract extraction w/ intraocular lens implant (Bilateral); Cholecystectomy (2009); hand fracture surgery; and Carpal tunnel release (Right, 05/18/2023).   Her family history includes Alzheimer's disease in her brother and sister; CAD in her brother; Cancer in her sister, sister, and sister; Diabetes  in her sister; Heart disease in her brother, brother, father, and mother; Hyperlipidemia in her sister; Hypertension in her brother; Kidney disease in her brother and sister.She reports that she has never smoked. She has never used smokeless tobacco. She reports that she does not drink alcohol and does not use drugs.    ROS Review of Systems  Constitutional:  Negative for chills, diaphoresis and fever.  HENT:  Negative for congestion.   Eyes:  Negative for visual disturbance.  Respiratory:  Negative for cough and shortness of breath.   Cardiovascular:  Negative for chest pain and palpitations.  Gastrointestinal:  Negative for constipation, diarrhea and nausea.  Genitourinary:  Positive for dysuria, frequency and urgency. Negative for decreased urine volume, flank pain, hematuria, menstrual problem and pelvic pain.  Musculoskeletal:  Negative for arthralgias and joint swelling.  Skin:  Negative for rash.  Neurological:  Negative for dizziness and numbness.    Objective:  BP 129/71   Pulse 63   Temp 98 F (36.7 C)   Ht 5' 6 (1.676 m)   Wt 190 lb 12.8 oz (86.5 kg)   SpO2 95%   BMI 30.80 kg/m   BP Readings from Last 3 Encounters:  12/12/23 129/71  09/11/23 (!) 140/90  05/30/23 134/86    Wt Readings from Last 3 Encounters:  12/12/23 190 lb 12.8 oz (86.5 kg)  09/11/23 194 lb 9.6 oz (88.3 kg)  05/30/23 196 lb (88.9 kg)     Physical Exam Constitutional:      Appearance: She is well-developed.  HENT:     Head: Normocephalic and atraumatic.  Cardiovascular:     Rate and Rhythm: Normal rate and regular rhythm.     Heart sounds: No murmur heard. Pulmonary:     Effort: Pulmonary effort is normal.     Breath sounds: Normal breath sounds.  Abdominal:     General: Bowel sounds are normal.     Palpations: Abdomen is soft. There is no mass.     Tenderness: There is no abdominal tenderness. There is no guarding or rebound.  Musculoskeletal:        General: No tenderness.   Skin:    General: Skin is warm and dry.  Neurological:     Mental Status: She is alert and oriented to person, place, and time.  Psychiatric:        Behavior: Behavior normal.    Physical Exam VITALS: BP- 129/71 GENERAL: Alert, cooperative, well developed, no acute distress HEENT: Normocephalic, normal oropharynx, moist mucous membranes CHEST: Clear to auscultation bilaterally, No wheezes, rhonchi, or crackles CARDIOVASCULAR: Normal heart rate and rhythm, S1 and S2 normal without murmurs ABDOMEN: Soft, non-tender, non-distended, without organomegaly, Normal bowel sounds, No costovertebral angle tenderness EXTREMITIES: No cyanosis or edema NEUROLOGICAL: Cranial nerves grossly intact, Moves all extremities without gross motor or sensory deficit   Assessment & Plan:  Cystitis -     Ciprofloxacin HCl; Take 1 tablet (250 mg total) by mouth 2 (two) times daily.  Dispense: 10 tablet; Refill: 0  Dysuria -     Urinalysis, Routine w reflex microscopic -     Urine Culture -     Microscopic Examination    Assessment and Plan Assessment & Plan Dysuria   Acute dysuria with a burning sensation during urination suggests a likely urinary tract infection. There is no increased frequency, urgency, fever, chills, or flank pain. Prescribe ciprofloxacin 500 mg twice daily for 5 days due to allergy to penicillin and sulfa drugs.       Follow-up: as needed  Butler Der, M.D.

## 2023-12-15 LAB — URINE CULTURE

## 2023-12-17 ENCOUNTER — Ambulatory Visit: Payer: Self-pay | Admitting: Family Medicine

## 2024-01-29 ENCOUNTER — Ambulatory Visit: Payer: Self-pay

## 2024-01-29 VITALS — BP 129/71 | HR 63 | Ht 66.0 in | Wt 190.0 lb

## 2024-01-29 DIAGNOSIS — Z0000 Encounter for general adult medical examination without abnormal findings: Secondary | ICD-10-CM | POA: Diagnosis not present

## 2024-01-29 DIAGNOSIS — Z13820 Encounter for screening for osteoporosis: Secondary | ICD-10-CM

## 2024-01-29 NOTE — Progress Notes (Signed)
 Chief Complaint  Patient presents with   Medicare Wellness     Subjective:   Carol [name] is a 78 y.o. female who presents for a Medicare Annual Wellness Visit.  Visit info / Clinical Intake: Medicare Wellness Visit Type:: Subsequent Annual Wellness Visit Persons participating in visit and providing information:: patient Medicare Wellness Visit Mode:: Telephone If telephone:: video declined Since this visit was completed virtually, some vitals may be partially provided or unavailable. Missing vitals are due to the limitations of the virtual format.: Documented vitals are patient reported If Telephone or Video please confirm:: I connected with patient using audio/video enable telemedicine. I verified patient identity with two identifiers, discussed telehealth limitations, and patient agreed to proceed. Patient Location:: home Provider Location:: home office Interpreter Needed?: No Pre-visit prep was completed: yes AWV questionnaire completed by patient prior to visit?: no Living arrangements:: (!) lives alone Patient's Overall Health Status Rating: very good Typical amount of pain: none Does pain affect daily life?: no Are you currently prescribed opioids?: no  Dietary Habits and Nutritional Risks How many meals a day?: 2 Eats fruit and vegetables daily?: yes Most meals are obtained by: preparing own meals In the last 2 weeks, have you had any of the following?: none Diabetic:: no  Functional Status Activities of Daily Living (to include ambulation/medication): Independent Ambulation: Independent Medication Administration: Independent Home Management (perform basic housework or laundry): Independent Manage your own finances?: yes Primary transportation is: driving Concerns about hearing?: no  Fall Screening Falls in the past year?: 1 Number of falls in past year: 0 Was there an injury with Fall?: 0 Fall Risk Category Calculator: 1 Patient Fall Risk Level: Low Fall  Risk  Fall Risk Patient at Risk for Falls Due to: No Fall Risks Fall risk Follow up: Falls evaluation completed; Education provided  Home and Transportation Safety: All rugs have non-skid backing?: yes All stairs or steps have railings?: yes Grab bars in the bathtub or shower?: yes Have non-skid surface in bathtub or shower?: yes Good home lighting?: yes Regular seat belt use?: yes Hospital stays in the last year:: no  Cognitive Assessment Difficulty concentrating, remembering, or making decisions? : no Will 6CIT or Mini Cog be Completed: yes What year is it?: 0 points What month is it?: 0 points Give patient an address phrase to remember (5 components): [address] [place] About what time is it?: 0 points Count backwards from 20 to 1: 0 points Say the months of the year in reverse: 0 points Repeat the address phrase from earlier: 0 points 6 CIT Score: 0 points  Advance Directives (For Healthcare) Does Patient Have a Medical Advance Directive?: No Would patient like information on creating a medical advance directive?: No - Patient declined  Reviewed/Updated  Reviewed/Updated: Reviewed All (Medical, Surgical, Family, Medications, Allergies, Care Teams, Patient Goals); Medical History; Surgical History; Family History; Medications; Allergies; Care Teams; Patient Goals    Allergies (verified) Penicillins, Sulfa antibiotics, Sulfamethoxazole, Lipitor [atorvastatin ], and Prednisone   Current Medications (verified) Outpatient Encounter Medications as of 01/29/2024  Medication Sig   aspirin EC 81 MG tablet Take 81 mg by mouth daily.   Calcium  500-125 MG-UNIT TABS 1 tablet. 1 tablet daily.   Cholecalciferol (VITAMIN D3) 2000 units capsule 1 capsule.   ciprofloxacin  (CIPRO ) 250 MG tablet Take 1 tablet (250 mg total) by mouth 2 (two) times daily.   Evolocumab  (REPATHA  SURECLICK) 140 MG/ML SOAJ Inject 140 mg into the skin every 14 (fourteen) days.  icosapent  Ethyl (VASCEPA ) 1  g capsule Take 2 capsules (2 g total) by mouth 2 (two) times daily.   rosuvastatin  (CRESTOR ) 5 MG tablet TAKE 1/2 (ONE-HALF) TABLET BY MOUTH THREE TIMES A WEEK   No facility-administered encounter medications on file as of 01/29/2024.    History: Past Medical History:  Diagnosis Date   Carotid artery stenosis    Cataract    Diverticulosis    Fractured hand    right   Hyperlipidemia    Osteopenia    Precancerous skin lesion 2016   Squamous cell carcinoma of skin 12/02/2015   left forehead (CX35FU)   Squamous cell carcinoma of skin 12/02/2015   in situ- right cheek (CX35FU)``   Squamous cell carcinoma of skin 07/13/2017   in situ-left forehead/hairline   Squamous cell carcinoma of skin 11/13/2017   in situ- left forehead medial (TXPBX)   Past Surgical History:  Procedure Laterality Date   CARPAL TUNNEL RELEASE Right 05/18/2023   Procedure: CARPAL TUNNEL RELEASE;  Surgeon: Murrell Drivers, MD;  Location: [facility];  Service: Orthopedics;  Laterality: Right;   CATARACT EXTRACTION W/ INTRAOCULAR LENS IMPLANT Bilateral    CHOLECYSTECTOMY  2009   EYE SURGERY     hand fracture surgery     Family History  Problem Relation Age of Onset   Heart disease Mother    Heart disease Father    Cancer Sister        breast   Hyperlipidemia Sister    Diabetes Sister    Cancer Sister        bone   Alzheimer's disease Sister    Cancer Sister        breast   Kidney disease Sister    Heart disease Brother    CAD Brother    Heart disease Brother    Hypertension Brother    Alzheimer's disease Brother    Kidney disease Brother        2018 starting dialysis   Social History   Occupational History   Occupation: retired    Associate Professor: [employer]  Tobacco Use   Smoking status: Never   Smokeless tobacco: Never  Vaping Use   Vaping status: Never Used  Substance and Sexual Activity   Alcohol use: No   Drug use: No   Sexual activity: Not Currently   Tobacco  Counseling Counseling given: Yes  SDOH Screenings   Food Insecurity: No Food Insecurity (01/29/2024)  Housing: Unknown (01/29/2024)  Transportation Needs: No Transportation Needs (01/29/2024)  Utilities: Not At Risk (01/29/2024)  Alcohol Screen: Low Risk  (12/13/2022)  Depression (PHQ2-9): Low Risk  (01/29/2024)  Financial Resource Strain: Low Risk  (12/13/2022)  Physical Activity: Insufficiently Active (01/29/2024)  Social Connections: Moderately Isolated (01/29/2024)  Stress: No Stress Concern Present (01/29/2024)  Tobacco Use: Low Risk  (01/29/2024)  Health Literacy: Adequate Health Literacy (01/29/2024)   See flowsheets for full screening details  Depression Screen PHQ 2 & 9 Depression Scale- Over the past 2 weeks, how often have you been bothered by any of the following problems? Little interest or pleasure in doing things: 0 Feeling down, depressed, or hopeless (PHQ Adolescent also includes...irritable): 0 PHQ-2 Total Score: 0     Goals Addressed             This Visit's Progress    Exercise 150 min/wk Moderate Activity   On track            Objective:    Today's Vitals  01/29/24 1158  BP: 129/71  Pulse: 63  Weight: 190 lb (86.2 kg)  Height: 5' 6 (1.676 m)   Body mass index is 30.67 kg/m.  Hearing/Vision screen Hearing Screening - Comments:: Pt wear hearing aids Vision Screening - Comments:: Dr. Effie, pt denies vision dif/last 10/01/2023 Immunizations and Health Maintenance Health Maintenance  Topic Date Due   Zoster Vaccines- Shingrix (1 of 2) 09/[birth year]/1962   COVID-19 Vaccine (3 - Moderna risk series) 06/06/2019   Influenza Vaccine  09/28/2023   Bone Density Scan  12/22/2023   DTaP/Tdap/Td (2 - Td or Tdap) 02/14/2024 (Originally 02/28/2016)   Pneumococcal Vaccine: 50+ Years (1 of 1 - PCV) 02/14/2024 (Originally 09/24/1993)   Colonoscopy  02/14/2024 (Originally 12/08/2021)   Mammogram  04/03/2024   Medicare Annual Wellness (AWV)  01/28/2025   Meningococcal  B Vaccine  Aged Out   Hepatitis C Screening  Discontinued        Assessment/Plan:  This is a routine wellness examination for Carol [name].  Patient Care Team: Jolinda Norene HERO, DO as PCP - General (Family Medicine) Mona Vinie BROCKS, MD as PCP - Cardiology (Cardiology) Livingston Rigg, MD as Consulting Physician (Dermatology) Leslee Reusing, MD as Consulting Physician (Ophthalmology) Donnald Charleston, MD as Consulting Physician (Gastroenterology) Mona Vinie BROCKS, MD as Consulting Physician (Cardiology) Sheffield, Andrez SAUNDERS, PA-C (Inactive) as Physician Assistant (Dermatology) Duke, Jon Garre, [place] as Physician Assistant (Cardiology)  I have personally reviewed and noted the following in the patient's chart:   Medical and social history Use of alcohol, tobacco or illicit drugs  Current medications and supplements including opioid prescriptions. Functional ability and status Nutritional status Physical activity Advanced directives List of other physicians Hospitalizations, surgeries, and ER visits in previous 12 months Vitals Screenings to include cognitive, depression, and falls Referrals and appointments  Orders Placed This Encounter  Procedures   DG [facility] DEXA    Standing Status:   Future    Expiration Date:   01/28/2025    Reason for Exam (SYMPTOM  OR DIAGNOSIS REQUIRED):   osteoporosis   In addition, I have reviewed and discussed with patient certain preventive protocols, quality metrics, and best practice recommendations. A written personalized care plan for preventive services as well as general preventive health recommendations were provided to patient.   Ozie Ned, CMA   01/29/2024   Return in 1 year (on 01/28/2025).  After Visit Summary: (MyChart) Due to this being a telephonic visit, the after visit summary with patients personalized plan was offered to patient via MyChart   Nurse Notes: pt is aware covid, shingles, flu--pt declined vaccines

## 2024-02-15 ENCOUNTER — Ambulatory Visit

## 2024-02-15 ENCOUNTER — Encounter: Payer: Self-pay | Admitting: Family Medicine

## 2024-02-15 ENCOUNTER — Encounter: Payer: 59 | Admitting: Family Medicine

## 2024-02-15 ENCOUNTER — Ambulatory Visit: Payer: Self-pay

## 2024-02-15 ENCOUNTER — Other Ambulatory Visit

## 2024-02-15 VITALS — BP 146/88 | HR 60 | Temp 97.4°F | Ht 66.0 in | Wt 189.4 lb

## 2024-02-15 DIAGNOSIS — M545 Low back pain, unspecified: Secondary | ICD-10-CM | POA: Diagnosis not present

## 2024-02-15 DIAGNOSIS — D049 Carcinoma in situ of skin, unspecified: Secondary | ICD-10-CM

## 2024-02-15 DIAGNOSIS — G8929 Other chronic pain: Secondary | ICD-10-CM

## 2024-02-15 DIAGNOSIS — I6523 Occlusion and stenosis of bilateral carotid arteries: Secondary | ICD-10-CM | POA: Diagnosis not present

## 2024-02-15 DIAGNOSIS — E785 Hyperlipidemia, unspecified: Secondary | ICD-10-CM | POA: Diagnosis not present

## 2024-02-15 DIAGNOSIS — M8589 Other specified disorders of bone density and structure, multiple sites: Secondary | ICD-10-CM

## 2024-02-15 DIAGNOSIS — Z0000 Encounter for general adult medical examination without abnormal findings: Secondary | ICD-10-CM

## 2024-02-15 DIAGNOSIS — Z13820 Encounter for screening for osteoporosis: Secondary | ICD-10-CM | POA: Diagnosis not present

## 2024-02-15 MED ORDER — LIDOCAINE 5 % EX PTCH: 1.0000 | MEDICATED_PATCH | CUTANEOUS | 99 refills | Status: AC

## 2024-02-15 NOTE — Progress Notes (Addendum)
 "  Carol [name] is a 78 y.o. female presents to office today for annual physical exam examination.    She reports she has been doing relatively well except for some back pain.  This has been a chronic issue and typically relieved by Tylenol .  She reports no radicular symptoms but sometimes pain can be severe.  She had 1 fall but this was when she fell through a chair apparently.  No mechanical falls or sensory issues.  She continues to remain physically active.  She saw dermatology and had a lesion on the right cheek frozen had a lesion between her breast frozen.  The lesion between the breast went away but one of the right cheek has recurred.  Will not see her dermatologist again for quite some time  Occupation: retired  Health Maintenance Due  Topic Date Due   Mammogram  04/03/2024    Immunization History  Administered Date(s) Administered   Influenza,inj,Quad PF,6+ Mos 11/26/2012, 11/26/2013, 12/04/2014   Moderna Sars-Covid-2 Vaccination 04/10/2019, 05/09/2019   Tdap 02/27/2006, 10/17/2023   Zoster, Live 12/19/2013   Past Medical History:  Diagnosis Date   Carotid artery stenosis    Cataract    Diverticulosis    Fractured hand    right   Hyperlipidemia    Osteopenia    Precancerous skin lesion 2016   Squamous cell carcinoma of skin 12/02/2015   left forehead (CX35FU)   Squamous cell carcinoma of skin 12/02/2015   in situ- right cheek (CX35FU)``   Squamous cell carcinoma of skin 07/13/2017   in situ-left forehead/hairline   Squamous cell carcinoma of skin 11/13/2017   in situ- left forehead medial (TXPBX)   Social History   Socioeconomic History   Marital status: Widowed    Spouse name: Not on file   Number of children: 2   Years of education: 69   Highest education level: 12th grade  Occupational History   Occupation: retired    Associate Professor: [employer]  Tobacco Use   Smoking status: Never   Smokeless tobacco: Never  Vaping Use   Vaping status: Never  Used  Substance and Sexual Activity   Alcohol use: No   Drug use: No   Sexual activity: Not Currently  Other Topics Concern   Not on file  Social History Narrative   Lives alone.    Loves [employer]!   Granddaughter graduating from [employer].  She's very proud of her   One child in [place], one in [place]   Social Drivers of Health   Tobacco Use: Low Risk (02/15/2024)   Patient History    Smoking Tobacco Use: Never    Smokeless Tobacco Use: Never    Passive Exposure: Not on file  Financial Resource Strain: Low Risk (02/14/2024)   Overall Financial Resource Strain (CARDIA)    Difficulty of Paying Living Expenses: Not hard at all  Food Insecurity: No Food Insecurity (02/14/2024)   Epic    Worried About Programme Researcher, Broadcasting/film/video in the Last Year: Never true    Ran Out of Food in the Last Year: Never true  Transportation Needs: No Transportation Needs (02/14/2024)   Epic    Lack of Transportation (Medical): No    Lack of Transportation (Non-Medical): No  Physical Activity: Insufficiently Active (01/29/2024)   Exercise Vital Sign    Days of Exercise per Week: 3 days    Minutes of Exercise per Session: 30 min  Stress: No Stress Concern Present (02/14/2024)   [employer] of  Occupational Health - Occupational Stress Questionnaire    Feeling of Stress: Not at all  Social Connections: Moderately Isolated (02/14/2024)   Social Connection and Isolation Panel    Frequency of Communication with Friends and Family: More than three times a week    Frequency of Social Gatherings with Friends and Family: More than three times a week    Attends Religious Services: More than 4 times per year    Active Member of [employer] or Organizations: No    Attends Banker Meetings: Not on file    Marital Status: Widowed  Intimate Partner Violence: Not At Risk (01/29/2024)   Epic    Fear of Current or Ex-Partner: No    Emotionally Abused: No    Physically Abused: No    Sexually Abused: No   Depression (PHQ2-9): Low Risk (01/29/2024)   Depression (PHQ2-9)    PHQ-2 Score: 0  Alcohol Screen: Low Risk (12/13/2022)   Alcohol Screen    Last Alcohol Screening Score (AUDIT): 0  Housing: Unknown (02/14/2024)   Epic    Unable to Pay for Housing in the Last Year: No    Number of Times Moved in the Last Year: Not on file    Homeless in the Last Year: No  Utilities: Not At Risk (01/29/2024)   Epic    Threatened with loss of utilities: No  Health Literacy: Adequate Health Literacy (01/29/2024)   B1300 Health Literacy    Frequency of need for help with medical instructions: Never   Past Surgical History:  Procedure Laterality Date   CARPAL TUNNEL RELEASE Right 05/18/2023   Procedure: CARPAL TUNNEL RELEASE;  Surgeon: Murrell Drivers, MD;  Location: [facility];  Service: Orthopedics;  Laterality: Right;   CATARACT EXTRACTION W/ INTRAOCULAR LENS IMPLANT Bilateral    CHOLECYSTECTOMY  2009   EYE SURGERY     hand fracture surgery     Family History  Problem Relation Age of Onset   Heart disease Mother    Heart disease Father    Cancer Sister        breast   Hyperlipidemia Sister    Diabetes Sister    Cancer Sister        bone   Alzheimer's disease Sister    Cancer Sister        breast   Kidney disease Sister    Heart disease Brother    CAD Brother    Heart disease Brother    Hypertension Brother    Alzheimer's disease Brother    Kidney disease Brother        2018 starting dialysis   Current Medications[1]  Allergies[2]   ROS: Review of Systems Pertinent items noted in HPI and remainder of comprehensive ROS otherwise negative.    Physical exam BP (!) 146/88   Pulse 60   Temp (!) 97.4 F (36.3 C)   Ht 5' 6 (1.676 m)   Wt 189 lb 6 oz (85.9 kg)   SpO2 95%   BMI 30.57 kg/m  General appearance: alert, cooperative, appears stated age, no distress, and mildly obese Head: Normocephalic, without obvious abnormality, atraumatic Eyes: negative findings:  lids and lashes normal, conjunctivae and sclerae normal, corneas clear, and pupils equal, round, reactive to light and accomodation Ears: Obscured by cerumen bilaterally Nose: Irregularity, postop changes noted on the right nare both internally and externally Throat: lips, mucosa, and tongue normal; teeth and gums normal Neck: no adenopathy, no carotid bruit, supple, symmetrical, trachea midline, and thyroid   not enlarged, symmetric, no tenderness/mass/nodules Back: symmetric, no curvature. ROM normal. No CVA tenderness. Lungs: clear to auscultation bilaterally Heart: regular rate and rhythm, S1, S2 normal, no murmur, click, rub or gallop Abdomen: soft, non-tender; bowel sounds normal; no masses,  no organomegaly Extremities: extremities normal, atraumatic, no cyanosis or edema Pulses: 2+ and symmetric Skin: She has an actinic keratosis on the right cheek.  Several seborrheic keratosis noted along the anterior chest between the breasts Lymph nodes: No supraclavicular or anterior cervical lymph node enlargement Neurologic: Alert and oriented X 3, normal strength and tone. Normal symmetric reflexes. Normal coordination and gait      01/29/2024   12:16 PM 12/12/2023    1:18 PM 05/30/2023    9:10 AM  Depression screen PHQ 2/9  Decreased Interest 0 0 0  Down, Depressed, Hopeless 0 0 0  PHQ - 2 Score 0 0 0      12/12/2023    1:18 PM 05/30/2023    9:10 AM 02/14/2023    9:06 AM 12/21/2021    1:00 PM  GAD 7 : Generalized Anxiety Score  Nervous, Anxious, on Edge 0 0 0 0  Control/stop worrying 0 0 0 0  Worry too much - different things 0   0  Trouble relaxing 0   0  Restless 0   0  Easily annoyed or irritable 0   0  Afraid - awful might happen 0   0  Total GAD 7 Score 0   0  Anxiety Difficulty    Not difficult at all    Recent Results (from the past 2160 hours)  Urinalysis, Routine w reflex microscopic     Status: Abnormal   Collection Time: 12/12/23  1:33 PM  Result Value Ref Range    Specific Gravity, UA 1.010 1.005 - 1.030   pH, UA 5.5 5.0 - 7.5   Color, UA Yellow Yellow   Appearance Ur Clear Clear   Leukocytes,UA Negative Negative   Protein,UA Negative Negative/Trace   Glucose, UA Negative Negative   Ketones, UA Negative Negative   RBC, UA Trace (A) Negative   Bilirubin, UA Negative Negative   Urobilinogen, Ur 0.2 0.2 - 1.0 mg/dL   Nitrite, UA Negative Negative   Microscopic Examination See below:   Urine Culture     Status: None   Collection Time: 12/12/23  1:33 PM   Specimen: Urine   UR  Result Value Ref Range   Urine Culture, Routine Final report    Organism ID, Bacteria Comment     Comment: Mixed urogenital flora Less than 10,000 colonies/mL   Microscopic Examination     Status: None   Collection Time: 12/12/23  1:33 PM   Urine  Result Value Ref Range   WBC, UA None seen 0 - 5 /hpf   RBC, Urine None seen 0 - 2 /hpf   Epithelial Cells (non renal) 0-10 0 - 10 /hpf   Renal Epithel, UA None seen None seen /hpf   Bacteria, UA None seen None seen/Few   Yeast, UA None seen None seen     Assessment/ Plan: Elijah JAYSON Clarity here for annual physical exam.   Annual physical exam  Hyperlipidemia LDL goal <70 - Plan: CMP14+EGFR, Lipid Panel, TSH  Bilateral carotid artery stenosis - Plan: CMP14+EGFR, Lipid Panel, TSH  Osteopenia of multiple sites - Plan: CMP14+EGFR, CBC with Differential, VITAMIN D  25 Hydroxy (Vit-D Deficiency, Fractures)  Squamous cell carcinoma in situ (SCCIS) of skin - Plan: CBC with Differential  Chronic right-sided low back pain without sciatica - Plan: DG Lumbar Spine Complete, lidocaine  (LIDODERM ) 5 %   Fasting labs collected.  Will CC results to cardiology.  Repeat DEXA scan today given osteopenia  Being monitored by dermatology for squamous cell carcinoma and actinic keratoses.  I offered cryoablation of the lesion on the right cheek but she wanted to hold off until after the holidays for this because she has family coming  in  Will obtain plain films of the lumbar spine and we discussed consideration for physical therapy referral versus orthopedics.  Counseled on healthy lifestyle choices, including diet (rich in fruits, vegetables and lean meats and low in salt and simple carbohydrates) and exercise (at least 30 minutes of moderate physical activity daily).  Patient to follow up 1 year for CPE  Arletta Lumadue M. Haniyah Maciolek, DO        [1]  Current Outpatient Medications:    aspirin EC 81 MG tablet, Take 81 mg by mouth daily., Disp: , Rfl:    Calcium  500-125 MG-UNIT TABS, 1 tablet. 1 tablet daily., Disp: , Rfl:    Cholecalciferol (VITAMIN D3) 2000 units capsule, 1 capsule., Disp: , Rfl:    Evolocumab  (REPATHA  SURECLICK) 140 MG/ML SOAJ, Inject 140 mg into the skin every 14 (fourteen) days., Disp: 6 mL, Rfl: 3   icosapent  Ethyl (VASCEPA ) 1 g capsule, Take 2 capsules (2 g total) by mouth 2 (two) times daily., Disp: 60 capsule, Rfl: 3   rosuvastatin  (CRESTOR ) 5 MG tablet, TAKE 1/2 (ONE-HALF) TABLET BY MOUTH THREE TIMES A WEEK, Disp: 90 tablet, Rfl: 3 [2]  Allergies Allergen Reactions   Penicillins Hives   Sulfa Antibiotics    Sulfamethoxazole Itching   Lipitor [Atorvastatin ] Diarrhea   Prednisone Other (See Comments)    prednisone   "

## 2024-02-15 NOTE — Addendum Note (Signed)
 Addended by: JOLINDA NORENE HERO on: 02/15/2024 09:31 AM   Modules accepted: Orders

## 2024-02-16 LAB — CBC WITH DIFFERENTIAL/PLATELET
Basophils Absolute: 0 x10E3/uL (ref 0.0–0.2)
Basos: 1 %
EOS (ABSOLUTE): 0.2 x10E3/uL (ref 0.0–0.4)
Eos: 3 %
Hematocrit: 46.4 % (ref 34.0–46.6)
Hemoglobin: 15 g/dL (ref 11.1–15.9)
Immature Grans (Abs): 0 x10E3/uL (ref 0.0–0.1)
Immature Granulocytes: 0 %
Lymphocytes Absolute: 2 x10E3/uL (ref 0.7–3.1)
Lymphs: 34 %
MCH: 30.2 pg (ref 26.6–33.0)
MCHC: 32.3 g/dL (ref 31.5–35.7)
MCV: 94 fL (ref 79–97)
Monocytes Absolute: 0.4 x10E3/uL (ref 0.1–0.9)
Monocytes: 7 %
Neutrophils Absolute: 3.4 x10E3/uL (ref 1.4–7.0)
Neutrophils: 55 %
Platelets: 212 x10E3/uL (ref 150–450)
RBC: 4.96 x10E6/uL (ref 3.77–5.28)
RDW: 13 % (ref 11.7–15.4)
WBC: 6.1 x10E3/uL (ref 3.4–10.8)

## 2024-02-16 LAB — CMP14+EGFR
ALT: 22 IU/L (ref 0–32)
AST: 20 IU/L (ref 0–40)
Albumin: 4.5 g/dL (ref 3.8–4.8)
Alkaline Phosphatase: 86 IU/L (ref 49–135)
BUN/Creatinine Ratio: 14 (ref 12–28)
BUN: 15 mg/dL (ref 8–27)
Bilirubin Total: 1.3 mg/dL — ABNORMAL HIGH (ref 0.0–1.2)
CO2: 25 mmol/L (ref 20–29)
Calcium: 9.6 mg/dL (ref 8.7–10.3)
Chloride: 105 mmol/L (ref 96–106)
Creatinine, Ser: 1.09 mg/dL — ABNORMAL HIGH (ref 0.57–1.00)
Globulin, Total: 2.9 g/dL (ref 1.5–4.5)
Glucose: 101 mg/dL — ABNORMAL HIGH (ref 70–99)
Potassium: 5.1 mmol/L (ref 3.5–5.2)
Sodium: 147 mmol/L — AB (ref 134–144)
Total Protein: 7.4 g/dL (ref 6.0–8.5)
eGFR: 51 mL/min/1.73 — ABNORMAL LOW

## 2024-02-16 LAB — TSH: TSH: 2.72 u[IU]/mL (ref 0.450–4.500)

## 2024-02-16 LAB — LIPID PANEL
Chol/HDL Ratio: 2.2 ratio (ref 0.0–4.4)
Cholesterol, Total: 121 mg/dL (ref 100–199)
HDL: 56 mg/dL
LDL Chol Calc (NIH): 34 mg/dL (ref 0–99)
Triglycerides: 196 mg/dL — ABNORMAL HIGH (ref 0–149)
VLDL Cholesterol Cal: 31 mg/dL (ref 5–40)

## 2024-02-16 LAB — VITAMIN D 25 HYDROXY (VIT D DEFICIENCY, FRACTURES): Vit D, 25-Hydroxy: 32.3 ng/mL (ref 30.0–100.0)

## 2024-02-18 ENCOUNTER — Other Ambulatory Visit (HOSPITAL_COMMUNITY): Payer: Self-pay

## 2024-02-18 ENCOUNTER — Ambulatory Visit: Payer: Self-pay | Admitting: Family Medicine

## 2024-02-18 ENCOUNTER — Telehealth: Payer: Self-pay | Admitting: Pharmacy Technician

## 2024-02-18 NOTE — Telephone Encounter (Signed)
 Pharmacy Patient Advocate Encounter   Received notification from Onbase that prior authorization for Lidocaine  5% patches  is required/requested.   Insurance verification completed.   The patient is insured through [place] [employer].   Per test claim: PA required; PA started via CoverMyMeds. KEY [id number] . Waiting for clinical questions to populate.

## 2024-02-19 ENCOUNTER — Other Ambulatory Visit (HOSPITAL_COMMUNITY): Payer: Self-pay

## 2024-02-19 NOTE — Telephone Encounter (Signed)
 Pharmacy Patient Advocate Encounter  Received notification from [facility] [employer] that Prior Authorization for Lidocaine  5% patches has been DENIED.  Full denial letter will be uploaded to the media tab. See denial reason below.     PA #/Case ID/Reference #: [id number]

## 2024-03-13 ENCOUNTER — Other Ambulatory Visit (HOSPITAL_COMMUNITY): Payer: Self-pay | Admitting: Family Medicine

## 2024-03-13 DIAGNOSIS — Z1231 Encounter for screening mammogram for malignant neoplasm of breast: Secondary | ICD-10-CM

## 2024-04-04 ENCOUNTER — Ambulatory Visit (HOSPITAL_COMMUNITY): Admission: RE | Admit: 2024-04-04 | Source: Ambulatory Visit

## 2024-04-04 ENCOUNTER — Encounter (HOSPITAL_COMMUNITY): Payer: Self-pay

## 2024-04-04 DIAGNOSIS — Z1231 Encounter for screening mammogram for malignant neoplasm of breast: Secondary | ICD-10-CM

## 2024-07-28 ENCOUNTER — Encounter (HOSPITAL_COMMUNITY)

## 2025-01-29 ENCOUNTER — Ambulatory Visit

## 2025-02-17 ENCOUNTER — Encounter: Admitting: Family Medicine
